# Patient Record
Sex: Female | Born: 1975
Health system: Southern US, Community
[De-identification: ages and names within clinical notes are randomized; demographics above are authoritative.]

## PROBLEM LIST (undated history)

## (undated) DIAGNOSIS — I6789 Other cerebrovascular disease: Secondary | ICD-10-CM

## (undated) DIAGNOSIS — Z8489 Family history of other specified conditions: Secondary | ICD-10-CM

## (undated) DIAGNOSIS — Z87442 Personal history of urinary calculi: Secondary | ICD-10-CM

## (undated) DIAGNOSIS — E785 Hyperlipidemia, unspecified: Secondary | ICD-10-CM

## (undated) DIAGNOSIS — G43909 Migraine, unspecified, not intractable, without status migrainosus: Secondary | ICD-10-CM

## (undated) DIAGNOSIS — F79 Unspecified intellectual disabilities: Secondary | ICD-10-CM

## (undated) DIAGNOSIS — R319 Hematuria, unspecified: Secondary | ICD-10-CM

## (undated) DIAGNOSIS — E7800 Pure hypercholesterolemia, unspecified: Secondary | ICD-10-CM

## (undated) DIAGNOSIS — R569 Unspecified convulsions: Secondary | ICD-10-CM

## (undated) DIAGNOSIS — L219 Seborrheic dermatitis, unspecified: Secondary | ICD-10-CM

## (undated) DIAGNOSIS — G40909 Epilepsy, unspecified, not intractable, without status epilepticus: Secondary | ICD-10-CM

## (undated) DIAGNOSIS — R4586 Emotional lability: Secondary | ICD-10-CM

## (undated) DIAGNOSIS — F39 Unspecified mood [affective] disorder: Secondary | ICD-10-CM

## (undated) DIAGNOSIS — R635 Abnormal weight gain: Secondary | ICD-10-CM

## (undated) HISTORY — DX: Hematuria, unspecified: R31.9

## (undated) HISTORY — DX: Other cerebrovascular disease: I67.89

## (undated) HISTORY — DX: Hyperlipidemia, unspecified: E78.5

## (undated) HISTORY — DX: Pure hypercholesterolemia, unspecified: E78.00

## (undated) HISTORY — DX: Abnormal weight gain: R63.5

## (undated) HISTORY — DX: Emotional lability: R45.86

## (undated) HISTORY — DX: Epilepsy, unspecified, not intractable, without status epilepticus: G40.909

## (undated) HISTORY — DX: Migraine, unspecified, not intractable, without status migrainosus: G43.909

## (undated) HISTORY — DX: Unspecified convulsions: R56.9

## (undated) HISTORY — DX: Unspecified mood (affective) disorder: F39

## (undated) HISTORY — DX: Seborrheic dermatitis, unspecified: L21.9

## (undated) HISTORY — PX: NO PAST SURGERIES: SHX2092

## (undated) HISTORY — DX: Unspecified intellectual disabilities: F79

---

## 2009-05-16 DIAGNOSIS — E785 Hyperlipidemia, unspecified: Secondary | ICD-10-CM

## 2009-05-16 DIAGNOSIS — G40909 Epilepsy, unspecified, not intractable, without status epilepticus: Secondary | ICD-10-CM

## 2009-05-16 HISTORY — DX: Epilepsy, unspecified, not intractable, without status epilepticus: G40.909

## 2009-05-16 HISTORY — DX: Hyperlipidemia, unspecified: E78.5

## 2009-12-12 ENCOUNTER — Ambulatory Visit: Payer: Self-pay | Admitting: Neurology

## 2011-10-17 ENCOUNTER — Other Ambulatory Visit: Payer: Self-pay | Admitting: Family Medicine

## 2012-09-08 DIAGNOSIS — R635 Abnormal weight gain: Secondary | ICD-10-CM | POA: Diagnosis not present

## 2012-09-08 DIAGNOSIS — F79 Unspecified intellectual disabilities: Secondary | ICD-10-CM | POA: Diagnosis not present

## 2012-09-08 DIAGNOSIS — G40909 Epilepsy, unspecified, not intractable, without status epilepticus: Secondary | ICD-10-CM | POA: Diagnosis not present

## 2012-09-08 DIAGNOSIS — E785 Hyperlipidemia, unspecified: Secondary | ICD-10-CM | POA: Diagnosis not present

## 2013-04-03 DIAGNOSIS — G40909 Epilepsy, unspecified, not intractable, without status epilepticus: Secondary | ICD-10-CM | POA: Diagnosis not present

## 2013-04-03 DIAGNOSIS — H6690 Otitis media, unspecified, unspecified ear: Secondary | ICD-10-CM | POA: Diagnosis not present

## 2013-04-03 DIAGNOSIS — F79 Unspecified intellectual disabilities: Secondary | ICD-10-CM | POA: Diagnosis not present

## 2013-04-03 DIAGNOSIS — H6120 Impacted cerumen, unspecified ear: Secondary | ICD-10-CM | POA: Diagnosis not present

## 2013-08-30 ENCOUNTER — Other Ambulatory Visit: Payer: Self-pay

## 2013-09-10 DIAGNOSIS — H6120 Impacted cerumen, unspecified ear: Secondary | ICD-10-CM | POA: Diagnosis not present

## 2013-09-10 DIAGNOSIS — F79 Unspecified intellectual disabilities: Secondary | ICD-10-CM | POA: Diagnosis not present

## 2013-09-10 DIAGNOSIS — Z0000 Encounter for general adult medical examination without abnormal findings: Secondary | ICD-10-CM | POA: Diagnosis not present

## 2013-09-10 DIAGNOSIS — Z01419 Encounter for gynecological examination (general) (routine) without abnormal findings: Secondary | ICD-10-CM | POA: Diagnosis not present

## 2013-09-10 DIAGNOSIS — E785 Hyperlipidemia, unspecified: Secondary | ICD-10-CM | POA: Diagnosis not present

## 2013-09-10 DIAGNOSIS — G40909 Epilepsy, unspecified, not intractable, without status epilepticus: Secondary | ICD-10-CM | POA: Diagnosis not present

## 2014-04-25 DIAGNOSIS — R569 Unspecified convulsions: Secondary | ICD-10-CM | POA: Diagnosis not present

## 2014-07-12 DIAGNOSIS — J069 Acute upper respiratory infection, unspecified: Secondary | ICD-10-CM | POA: Diagnosis not present

## 2014-07-12 DIAGNOSIS — G40909 Epilepsy, unspecified, not intractable, without status epilepticus: Secondary | ICD-10-CM | POA: Diagnosis not present

## 2015-01-17 DIAGNOSIS — Z1389 Encounter for screening for other disorder: Secondary | ICD-10-CM | POA: Diagnosis not present

## 2015-01-17 DIAGNOSIS — Z0000 Encounter for general adult medical examination without abnormal findings: Secondary | ICD-10-CM | POA: Diagnosis not present

## 2015-01-20 DIAGNOSIS — Z0000 Encounter for general adult medical examination without abnormal findings: Secondary | ICD-10-CM | POA: Diagnosis not present

## 2015-01-20 DIAGNOSIS — G40909 Epilepsy, unspecified, not intractable, without status epilepticus: Secondary | ICD-10-CM | POA: Diagnosis not present

## 2015-01-20 DIAGNOSIS — E785 Hyperlipidemia, unspecified: Secondary | ICD-10-CM | POA: Diagnosis not present

## 2015-01-20 DIAGNOSIS — F79 Unspecified intellectual disabilities: Secondary | ICD-10-CM | POA: Diagnosis not present

## 2015-01-20 LAB — LIPID PANEL
CHOLESTEROL: 246 mg/dL — AB (ref 0–200)
HDL: 68 mg/dL (ref 35–70)
LDL Cholesterol: 154 mg/dL
Triglycerides: 122 mg/dL (ref 40–160)

## 2015-01-20 LAB — BASIC METABOLIC PANEL
BUN: 9 mg/dL (ref 4–21)
Creatinine: 0.6 mg/dL (ref 0.5–1.1)
Glucose: 91 mg/dL
Potassium: 4.3 mmol/L (ref 3.4–5.3)
SODIUM: 140 mmol/L (ref 137–147)

## 2015-01-20 LAB — HEPATIC FUNCTION PANEL
ALT: 21 U/L (ref 7–35)
AST: 17 U/L (ref 13–35)
Alkaline Phosphatase: 81 U/L (ref 25–125)
Bilirubin, Total: 0.2 mg/dL

## 2015-01-20 LAB — CBC AND DIFFERENTIAL
HCT: 41 % (ref 36–46)
Hemoglobin: 13.9 g/dL (ref 12.0–16.0)
NEUTROS ABS: 54 /uL
PLATELETS: 278 10*3/uL (ref 150–399)
WBC: 5.8 10^3/mL

## 2015-01-20 LAB — TSH: TSH: 2.04 u[IU]/mL (ref 0.41–5.90)

## 2015-02-24 ENCOUNTER — Ambulatory Visit: Payer: Self-pay | Admitting: Family Medicine

## 2015-02-24 DIAGNOSIS — R635 Abnormal weight gain: Secondary | ICD-10-CM

## 2015-02-24 DIAGNOSIS — R4586 Emotional lability: Secondary | ICD-10-CM

## 2015-02-24 DIAGNOSIS — I6789 Other cerebrovascular disease: Secondary | ICD-10-CM

## 2015-02-24 DIAGNOSIS — G40909 Epilepsy, unspecified, not intractable, without status epilepticus: Secondary | ICD-10-CM | POA: Insufficient documentation

## 2015-02-24 DIAGNOSIS — F79 Unspecified intellectual disabilities: Secondary | ICD-10-CM

## 2015-02-24 HISTORY — DX: Epilepsy, unspecified, not intractable, without status epilepticus: G40.909

## 2015-02-24 HISTORY — DX: Emotional lability: R45.86

## 2015-02-24 HISTORY — DX: Unspecified intellectual disabilities: F79

## 2015-02-24 HISTORY — DX: Abnormal weight gain: R63.5

## 2015-04-15 ENCOUNTER — Other Ambulatory Visit: Payer: Self-pay | Admitting: Family Medicine

## 2015-04-17 ENCOUNTER — Other Ambulatory Visit: Payer: Self-pay | Admitting: Family Medicine

## 2015-04-17 DIAGNOSIS — G40001 Localization-related (focal) (partial) idiopathic epilepsy and epileptic syndromes with seizures of localized onset, not intractable, with status epilepticus: Secondary | ICD-10-CM

## 2015-05-25 DIAGNOSIS — R569 Unspecified convulsions: Secondary | ICD-10-CM | POA: Diagnosis not present

## 2015-06-21 DIAGNOSIS — R569 Unspecified convulsions: Secondary | ICD-10-CM | POA: Diagnosis not present

## 2015-06-21 DIAGNOSIS — G40309 Generalized idiopathic epilepsy and epileptic syndromes, not intractable, without status epilepticus: Secondary | ICD-10-CM | POA: Diagnosis not present

## 2015-08-06 ENCOUNTER — Other Ambulatory Visit: Payer: Self-pay | Admitting: Family Medicine

## 2015-08-11 ENCOUNTER — Other Ambulatory Visit: Payer: Self-pay | Admitting: Family Medicine

## 2015-08-15 NOTE — Telephone Encounter (Signed)
[facility]. RX was filled on 08/11/2015 for patient pick up.

## 2015-09-06 ENCOUNTER — Telehealth: Payer: Self-pay

## 2015-09-06 NOTE — Telephone Encounter (Signed)
[facility]ill [facility] Acetazolamide 250 mg tablet.

## 2015-09-07 ENCOUNTER — Other Ambulatory Visit: Payer: Self-pay | Admitting: Family Medicine

## 2015-09-08 NOTE — Telephone Encounter (Signed)
Dennis sent RX to pharmacy.  

## 2015-09-11 ENCOUNTER — Ambulatory Visit (INDEPENDENT_AMBULATORY_CARE_PROVIDER_SITE_OTHER): Payer: Medicare Other | Admitting: Family Medicine

## 2015-09-11 ENCOUNTER — Encounter: Payer: Self-pay | Admitting: Family Medicine

## 2015-09-11 VITALS — BP 100/68 | HR 95 | Resp 16 | Ht 64.0 in | Wt 168.0 lb

## 2015-09-11 DIAGNOSIS — E785 Hyperlipidemia, unspecified: Secondary | ICD-10-CM | POA: Diagnosis not present

## 2015-09-11 DIAGNOSIS — G40309 Generalized idiopathic epilepsy and epileptic syndromes, not intractable, without status epilepticus: Secondary | ICD-10-CM

## 2015-09-11 DIAGNOSIS — R569 Unspecified convulsions: Secondary | ICD-10-CM

## 2015-09-11 DIAGNOSIS — F39 Unspecified mood [affective] disorder: Secondary | ICD-10-CM | POA: Insufficient documentation

## 2015-09-11 DIAGNOSIS — G43909 Migraine, unspecified, not intractable, without status migrainosus: Secondary | ICD-10-CM | POA: Insufficient documentation

## 2015-09-11 HISTORY — DX: Unspecified convulsions: R56.9

## 2015-09-11 HISTORY — DX: Unspecified mood (affective) disorder: F39

## 2015-09-11 HISTORY — DX: Migraine, unspecified, not intractable, without status migrainosus: G43.909

## 2015-09-11 NOTE — Progress Notes (Signed)
Patient ID: Wendy Green, female   DOB: 1975/11/26, 39 y.o.   MRN: [id number]        Patient: Wendy Green Female    DOB: March 18, 1976   39 y.o.   MRN: [id number] Visit Date: 09/11/2015  Today's Provider: Vernie Murders, PA   Chief Complaint  Patient presents with  . Seizures  . Anxiety   Subjective:    Seizures  This is a chronic (Patient is followed-by Dr. Melrose Nakayama last seen on 07/03/15 next f/u 09/14/15.) problem. The problem has been gradually improving (Patient's mother reports that pt has not had witnessed seizures for about 6 months, per mother. ).  Anxiety Presents for follow-up visit. Primary symptoms comment: angry. Symptoms occur occasionally. Exacerbated by: problems at school [employer], suspended for 6 months)     Patient Active Problem List   Diagnosis Date Noted  . Headache, migraine 09/11/2015  . Episodic mood disorder ([place]) 09/11/2015  . Seizure ([place]) 09/11/2015  . Intellectual disability 02/24/2015  . Changeable mood ([place]) 02/24/2015  . Cerebral seizure ([place]) 02/24/2015  . Abnormal weight gain 02/24/2015  . Epilepsy ([place]) 05/16/2009  . HLD (hyperlipidemia) 05/16/2009   Past Surgical History  Procedure Laterality Date  . No past surgeries     Family History  Problem Relation Age of Onset  . Heart attack Father   . Stroke Maternal Grandmother   . Pneumonia Maternal Grandfather   . Cancer Paternal Grandfather     Allergies  Allergen Reactions  . Aspirin    Previous Medications   ACETAZOLAMIDE (DIAMOX) 250 MG TABLET    TAKE 1 TABLET BY MOUTH 3 TIMES A DAY   CETIRIZINE-PSEUDOEPHEDRINE (ZYRTEC-D) 5-120 MG TABLET    Take 1 tablet by mouth 2 (two) times daily.   FOLIC ACID (FOLVITE) 1 MG TABLET    TAKE 1 TABLET BY MOUTH ONCE DAILY   MULTIPLE VITAMIN PO    1 tablet daily.   OMEGA-3 FATTY ACIDS PO    1 tablet 2 (two) times daily.   OXCARBAZEPINE (TRILEPTAL) 150 MG TABLET    Take 1 tablet by mouth 2 (two) times daily.   PRIMIDONE (MYSOLINE) 250 MG  TABLET    TAKE 1 TABLET BY MOUTH THREE TIMES DAILY AND 1/2 TABLET IN THE EVENING   PSYLLIUM 48.57 % POWD    METAMUCIL, 48.57% (Oral Powder)  As Directed for 0 days  Quantity: 0.00;  Refills: 0   Ordered :31-Aug-2010  Celene Kras, MA, Anastasiya ;  Started 15-Aug-2009 Active   RED YEAST RICE 600 MG CAPS    Take 1 capsule by mouth daily.    Review of Systems  HENT: Negative.   Eyes: Negative.   Respiratory: Negative.   Cardiovascular: Negative.   Gastrointestinal: Negative.   Genitourinary: Negative.   Neurological: Positive for seizures.    Social History  Substance Use Topics  . Smoking status: Never Smoker   . Smokeless tobacco: Never Used  . Alcohol Use: No   Objective:   BP 100/68 mmHg  Pulse 95  Resp 16  Ht 5\' 4"  (1.626 m)  Wt 168 lb (76.204 kg)  BMI 28.82 kg/m2  SpO2 97%  LMP  (LMP Unknown)  Physical Exam  Constitutional: She appears well-developed and well-nourished.  HENT:  Head: Normocephalic.  Right Ear: External ear normal.  Left Ear: External ear normal.  Nose: Nose normal.  Mouth/Throat: Oropharynx is clear and moist.  Eyes: Conjunctivae and EOM are normal.  Neck: Normal range of motion. Neck supple.  Cardiovascular: Normal  rate, regular rhythm and normal heart sounds.   Pulmonary/Chest: Effort normal and breath sounds normal.  Abdominal: Soft.  Musculoskeletal: Normal range of motion.  Neurological: She has normal reflexes.  Difficulty cooperating with exam.  Psychiatric:  Flat affect with mental retardation and irritability.      Assessment & Plan:     1. Nonintractable generalized idiopathic epilepsy without status epilepticus ([place]) Stable with no significant seizure activity in 6 months. Neurologist (Dr. Melrose Nakayama) adjusting medications to control absence seizures. Will recheck labs and continue present follow up plan with Dr. Melrose Nakayama on 09-14-15. - CBC with Differential/Platelet - Phenobarbital level  2. HLD (hyperlipidemia) Still trying to  control fats in diet and using Krill Oil one to two times a day. Recheck labs and follow up pending reports. - COMPLETE METABOLIC PANEL WITH GFR - Lipid panel - TSH  3. Episodic mood disorder (HCC) Some irritability and anxiety around any strangers/unfamiliar people. Lives with mother full time. Considering an adult daycare occasionally. Mood swings associated with mental retardation.       Vernie Murders, PA  [facility]

## 2015-09-12 ENCOUNTER — Telehealth: Payer: Self-pay | Admitting: Family Medicine

## 2015-09-12 NOTE — Telephone Encounter (Signed)
Mom called in this moring asking if the [facility] [place].  She did not see it on the summary.  She said you had talked about ordering it for her.  Her call backis [phone] Thanks, Con Memos

## 2015-09-12 NOTE — Telephone Encounter (Signed)
LMTCB

## 2015-09-12 NOTE — Telephone Encounter (Signed)
Please review

## 2015-09-12 NOTE — Telephone Encounter (Signed)
Flonase is available over the counter now and does not require a prescription. Would give it a try this way first. If it works well, will try to get it by prescription generically.

## 2015-09-13 NOTE — Telephone Encounter (Signed)
Patient's mother Remo Lipps advised as directed below.

## 2015-09-14 DIAGNOSIS — G40309 Generalized idiopathic epilepsy and epileptic syndromes, not intractable, without status epilepticus: Secondary | ICD-10-CM | POA: Diagnosis not present

## 2015-09-15 ENCOUNTER — Telehealth: Payer: Self-pay | Admitting: Family Medicine

## 2015-09-15 NOTE — Telephone Encounter (Signed)
Pt's mom came to the office to request when we get the lab results back she would like them faxed to Dr. Melrose Nakayama (pt urologist). I advised that Simona Huh is out of the office until 09/26/15 and when the nurse calls with the labs results she might want to mention this again. Thanks TNP

## 2015-09-15 NOTE — Telephone Encounter (Signed)
Fyi....  Thanks,   -Mickel Baas

## 2015-09-19 DIAGNOSIS — E785 Hyperlipidemia, unspecified: Secondary | ICD-10-CM | POA: Diagnosis not present

## 2015-09-19 DIAGNOSIS — G40309 Generalized idiopathic epilepsy and epileptic syndromes, not intractable, without status epilepticus: Secondary | ICD-10-CM | POA: Diagnosis not present

## 2015-09-20 LAB — COMPREHENSIVE METABOLIC PANEL
ALBUMIN: 4.1 g/dL (ref 3.5–5.5)
ALK PHOS: 82 IU/L (ref 39–117)
ALT: 20 IU/L (ref 0–32)
AST: 16 IU/L (ref 0–40)
Albumin/Globulin Ratio: 1.9 (ref 1.1–2.5)
BUN / CREAT RATIO: 24 — AB (ref 8–20)
BUN: 12 mg/dL (ref 6–20)
Bilirubin Total: <0.2 mg/dL (ref 0.0–1.2)
CO2: 19 mmol/L (ref 18–29)
CREATININE: 0.49 mg/dL — AB (ref 0.57–1.00)
Calcium: 8.8 mg/dL (ref 8.7–10.2)
Chloride: 104 mmol/L (ref 96–106)
GFR calc Af Amer: 142 mL/min/{1.73_m2} (ref >59)
GFR, EST NON AFRICAN AMERICAN: 123 mL/min/{1.73_m2} (ref >59)
GLOBULIN, TOTAL: 2.2 g/dL (ref 1.5–4.5)
Glucose: 85 mg/dL (ref 65–99)
Potassium: 4.2 mmol/L (ref 3.5–5.2)
SODIUM: 140 mmol/L (ref 134–144)
Total Protein: 6.3 g/dL (ref 6.0–8.5)

## 2015-09-20 LAB — PHENOBARBITAL LEVEL: Phenobarbital, Serum: 23 ug/mL (ref 15–40)

## 2015-09-20 LAB — LIPID PANEL
CHOL/HDL RATIO: 3.3 ratio (ref 0.0–4.4)
Cholesterol, Total: 217 mg/dL — ABNORMAL HIGH (ref 100–199)
HDL: 65 mg/dL (ref >39)
LDL Calculated: 117 mg/dL — ABNORMAL HIGH (ref 0–99)
TRIGLYCERIDES: 173 mg/dL — AB (ref 0–149)
VLDL CHOLESTEROL CAL: 35 mg/dL (ref 5–40)

## 2015-09-20 LAB — CBC WITH DIFFERENTIAL/PLATELET
Basophils Absolute: 0 10*3/uL (ref 0.0–0.2)
Basos: 0 %
EOS (ABSOLUTE): 0.4 10*3/uL (ref 0.0–0.4)
EOS: 5 %
HEMATOCRIT: 40.2 % (ref 34.0–46.6)
HEMOGLOBIN: 13.5 g/dL (ref 11.1–15.9)
IMMATURE GRANS (ABS): 0 10*3/uL (ref 0.0–0.1)
IMMATURE GRANULOCYTES: 0 %
LYMPHS: 26 %
Lymphocytes Absolute: 1.9 10*3/uL (ref 0.7–3.1)
MCH: 32.1 pg (ref 26.6–33.0)
MCHC: 33.6 g/dL (ref 31.5–35.7)
MCV: 96 fL (ref 79–97)
MONOCYTES: 9 %
MONOS ABS: 0.6 10*3/uL (ref 0.1–0.9)
NEUTROS PCT: 60 %
Neutrophils Absolute: 4.3 10*3/uL (ref 1.4–7.0)
Platelets: 273 10*3/uL (ref 150–379)
RBC: 4.2 x10E6/uL (ref 3.77–5.28)
RDW: 13.6 % (ref 12.3–15.4)
WBC: 7.1 10*3/uL (ref 3.4–10.8)

## 2015-09-20 LAB — TSH: TSH: 2.87 u[IU]/mL (ref 0.450–4.500)

## 2015-09-21 NOTE — Telephone Encounter (Signed)
-----   Message from Margo Common, [place] sent at 09/21/2015  9:58 AM EST ----- Primidone in the therapeutic range (phenobarbital). Normal liver and kidney function. No anemia. Cholesterol still elevated but LDL better than last check and very good HDL. Triglycerides still elevated. Lower fats in diet and be sure a copy of this report is sent to her neurologist.

## 2015-09-21 NOTE — Telephone Encounter (Signed)
Patient and patient's mother Remo Lipps advised as directed below. Both verbalized understanding. Copy of lab results faxed to Dr. Melrose Nakayama at [phone].

## 2015-11-26 ENCOUNTER — Other Ambulatory Visit: Payer: Self-pay | Admitting: Family Medicine

## 2015-12-18 DIAGNOSIS — R569 Unspecified convulsions: Secondary | ICD-10-CM | POA: Diagnosis not present

## 2015-12-18 DIAGNOSIS — G40309 Generalized idiopathic epilepsy and epileptic syndromes, not intractable, without status epilepticus: Secondary | ICD-10-CM | POA: Diagnosis not present

## 2016-03-21 ENCOUNTER — Other Ambulatory Visit: Payer: Self-pay | Admitting: Family Medicine

## 2016-03-27 ENCOUNTER — Telehealth: Payer: Self-pay | Admitting: Family Medicine

## 2016-03-27 DIAGNOSIS — G40909 Epilepsy, unspecified, not intractable, without status epilepticus: Secondary | ICD-10-CM

## 2016-03-27 NOTE — Telephone Encounter (Signed)
Please review. Thanks!  

## 2016-03-27 NOTE — Telephone Encounter (Signed)
Pt mom Wendy Green is requesting a lab slip to have drug levels rechecked.  [id number]

## 2016-03-28 NOTE — Telephone Encounter (Signed)
Will put future order into chart for phenobarbital blood level. Be sure copy of the report is given to her neurologist.

## 2016-04-08 ENCOUNTER — Other Ambulatory Visit: Payer: Self-pay

## 2016-04-08 DIAGNOSIS — R569 Unspecified convulsions: Secondary | ICD-10-CM

## 2016-04-08 DIAGNOSIS — G40909 Epilepsy, unspecified, not intractable, without status epilepticus: Secondary | ICD-10-CM | POA: Diagnosis not present

## 2016-04-08 NOTE — Telephone Encounter (Signed)
Done

## 2016-04-11 LAB — PHENOBARBITAL LEVEL: PHENOBARBITAL, SERUM: 24 ug/mL (ref 15–40)

## 2016-04-11 LAB — 10-HYDROXYCARBAZEPINE: OXCARBAZEPINE SERPL-MCNC: 4 ug/mL — AB (ref 10–35)

## 2016-05-09 ENCOUNTER — Ambulatory Visit (INDEPENDENT_AMBULATORY_CARE_PROVIDER_SITE_OTHER): Payer: Medicare Other | Admitting: Family Medicine

## 2016-05-09 ENCOUNTER — Encounter: Payer: Self-pay | Admitting: Family Medicine

## 2016-05-09 VITALS — BP 100/62 | HR 85 | Temp 97.5°F | Resp 16 | Wt 169.8 lb

## 2016-05-09 DIAGNOSIS — H6121 Impacted cerumen, right ear: Secondary | ICD-10-CM | POA: Diagnosis not present

## 2016-05-09 NOTE — Progress Notes (Signed)
Subjective:     Patient ID: Wendy Green, female   DOB: 1976-03-24, 40 y.o.   MRN: [id number]  HPI  Chief Complaint  Patient presents with  . Ear Pain    Patient comes in office today accompanied by her mother with concerns of right ear pain for the past 2-3 days.   No regular use of Q-tips but has had wax removal in the past. Reports swimming at camp last month.   Review of Systems     Objective:   Physical Exam  Constitutional: She appears well-developed and well-nourished. No distress.  HENT:  Left Ear: External ear normal.  Right ear occluded by cerumen. After irrigation per Kat: ear canal is patent, TM intact, and patient reports improvement in her sx.     Assessment:    1. Cerumen impaction, right - EAR CERUMEN REMOVAL    Plan:    Call if continued discomfort.

## 2016-05-09 NOTE — Patient Instructions (Addendum)
Let us know if ear continues to hurt.

## 2016-06-01 ENCOUNTER — Encounter: Payer: Self-pay | Admitting: Family Medicine

## 2016-06-01 ENCOUNTER — Ambulatory Visit (INDEPENDENT_AMBULATORY_CARE_PROVIDER_SITE_OTHER): Payer: Medicare Other | Admitting: Family Medicine

## 2016-06-01 VITALS — BP 100/62 | HR 89 | Temp 98.6°F | Resp 16 | Wt 168.2 lb

## 2016-06-01 DIAGNOSIS — N390 Urinary tract infection, site not specified: Secondary | ICD-10-CM | POA: Diagnosis not present

## 2016-06-01 DIAGNOSIS — M545 Low back pain: Secondary | ICD-10-CM

## 2016-06-01 DIAGNOSIS — R319 Hematuria, unspecified: Secondary | ICD-10-CM | POA: Diagnosis not present

## 2016-06-01 LAB — POCT URINALYSIS DIPSTICK
BILIRUBIN UA: NEGATIVE
Glucose, UA: NEGATIVE
Ketones, UA: NEGATIVE
NITRITE UA: NEGATIVE
PH UA: 7.5
Protein, UA: 30
SPEC GRAV UA: 1.01
Urobilinogen, UA: 0.2

## 2016-06-01 MED ORDER — CIPROFLOXACIN HCL 500 MG PO TABS: 500.0000 mg | ORAL_TABLET | Freq: Two times a day (BID) | ORAL | 0 refills | Status: AC

## 2016-06-01 MED ORDER — INDOMETHACIN 50 MG PO CAPS: ORAL_CAPSULE | ORAL | 0 refills | Status: DC

## 2016-06-01 NOTE — Progress Notes (Signed)
Patient: Wendy Green Female    DOB: July 08, 1976   40 y.o.   MRN: [id number] Visit Date: 06/01/2016  Today's Provider: Lelon Huh, MD   Chief Complaint  Patient presents with  . Back Pain   Subjective:    HPI Back Pain: Patient presents for presents evaluation of low back problems.  Symptoms have been present for a few weeks and include pain in lower right side of her back (dull and sharp in character; unable to say/10 in severity).Symptoms are worst: nighttime. Alleviating factors identifiable by patient are none. Exacerbating factors identifiable by patient are sitting. Treatments so far initiated by patient: none. She is having Hematuria. LMP was 05/16/16 and was normal.     Allergies  Allergen Reactions  . Aspirin      Current Outpatient Prescriptions:  .  acetaZOLAMIDE (DIAMOX) 250 MG tablet, TAKE 1 TABLET BY MOUTH 3 TIMES A DAY, Disp: 90 tablet, Rfl: 3 .  folic acid (FOLVITE) 1 MG tablet, TAKE 1 TABLET BY MOUTH ONCE DAILY, Disp: , Rfl:  .  KRILL OIL PO, Take by mouth., Disp: , Rfl:  .  MULTIPLE VITAMIN PO, 1 tablet daily., Disp: , Rfl:  .  OXcarbazepine (TRILEPTAL) 150 MG tablet, Take 1 tablet by mouth 2 (two) times daily., Disp: , Rfl:  .  PREVIDENT 5000 SENSITIVE 1.1-5 % PSTE, See admin instructions., Disp: , Rfl: 1 .  primidone (MYSOLINE) 250 MG tablet, TAKE 1 TABLET BY MOUTH THREE TIMES DAILY AND 1/2 TABLET IN THE EVENING, Disp: 100 tablet, Rfl: 3 .  Psyllium 48.57 % POWD, METAMUCIL, 48.57% (Oral Powder)  As Directed for 0 days  Quantity: 0.00;  Refills: 0   Ordered :31-Aug-2010  Celene Kras, MA, Anastasiya ;  Started 15-Aug-2009 Active, Disp: , Rfl:  .  Red Yeast Rice 600 MG CAPS, Take 1 capsule by mouth daily., Disp: , Rfl:   Review of Systems  Cardiovascular: Negative for chest pain, palpitations and leg swelling.  Genitourinary: Positive for hematuria. Negative for dysuria and frequency.  Musculoskeletal: Positive for back pain.    Social History    Substance Use Topics  . Smoking status: Never Smoker  . Smokeless tobacco: Never Used  . Alcohol use No   Objective:   BP 100/62 (BP Location: Left Arm, Patient Position: Sitting, Cuff Size: Normal)   Pulse 89   Temp 98.6 F (37 C) (Oral)   Resp 16   Wt 168 lb 3.2 oz (76.3 kg)   LMP 05/16/2016   BMI 28.87 kg/m   Physical Exam  General Appearance:    Alert, cooperative, no distress  Eyes:    PERRL, conjunctiva/corneas clear, EOM's intact       Lungs:     Clear to auscultation bilaterally, respirations unlabored  Heart:    Regular rate and rhythm  Abdomen:   bowel sounds present and normal in all 4 quadrants, soft, round, nontender or nondistended. No CVA tenderness. No spine or para-spinous tenderness.    Results for orders placed or performed in visit on 06/01/16  POCT urinalysis dipstick  Result Value Ref Range   Color, UA Dark Yellow    Clarity, UA Cloudy    Glucose, UA Negative    Bilirubin, UA Negative    Ketones, UA Negative    Spec Grav, UA 1.010    Blood, UA Large    pH, UA 7.5    Protein, UA 30    Urobilinogen, UA 0.2    Nitrite, UA  Negative    Leukocytes, UA small (1+) (A) Negative        Assessment & Plan:     1. Right low back pain, with sciatica presence unspecified Now resolved. Suspected she passed small stone.  Sent home with strainer and may take indomethacin if pain returns.  - POCT urinalysis dipstick - indomethacin (INDOCIN) 50 MG capsule; Take one tablet every eight hours as needed for pain  Dispense: 10 capsule; Refill: 0  2. Hematuria Culture urine and cover with Cipro while awaiting culture.  - ciprofloxacin (CIPRO) 500 MG tablet; Take 1 tablet (500 mg total) by mouth 2 (two) times daily.  Dispense: 14 tablet; Refill: 0  3. Urinary tract infection with hematuria, site unspecified  - Urine culture       Lelon Huh, MD  [facility]

## 2016-06-03 ENCOUNTER — Ambulatory Visit
Admission: RE | Admit: 2016-06-03 | Discharge: 2016-06-03 | Disposition: A | Discharge status: Home / Self Care | Payer: Medicare Other | Source: Ambulatory Visit | Attending: Family Medicine | Admitting: Family Medicine

## 2016-06-03 ENCOUNTER — Telehealth: Payer: Self-pay | Admitting: *Deleted

## 2016-06-03 DIAGNOSIS — R319 Hematuria, unspecified: Secondary | ICD-10-CM

## 2016-06-03 LAB — URINE CULTURE

## 2016-06-03 NOTE — Telephone Encounter (Signed)
Patient's mother Remo Lipps was notified. Remo Lipps stated that pt is not have any more pain but she is still having blood. They want to go ahead with Xray. Also wanted to know if she should continue antibiotic?

## 2016-06-03 NOTE — Telephone Encounter (Signed)
Remo Lipps was notified. Xray ordered.

## 2016-06-03 NOTE — Telephone Encounter (Signed)
She should stay on antibiotic. She probably has a stone and these can increase risk for urinary tract infection. Please order KUB for hematuria. If this is negative we will need to refer her to urologist.

## 2016-06-03 NOTE — Telephone Encounter (Signed)
-----   Message from Birdie Sons, MD sent at 06/03/2016  7:33 AM EDT ----- Urine culture does not show infection. If she has had any more blood or any pain then she needs Xr of kidneys and bladder (KUB)

## 2016-06-04 ENCOUNTER — Telehealth: Payer: Self-pay

## 2016-06-04 DIAGNOSIS — R319 Hematuria, unspecified: Secondary | ICD-10-CM | POA: Insufficient documentation

## 2016-06-04 HISTORY — DX: Hematuria, unspecified: R31.9

## 2016-06-04 NOTE — Telephone Encounter (Signed)
Pt's mom Remo Lipps returned call and would like a call back. Thanks TNP

## 2016-06-04 NOTE — Telephone Encounter (Signed)
LMTCB 06/04/2016  Thanks,   -Mickel Baas

## 2016-06-04 NOTE — Telephone Encounter (Signed)
Remo Lipps advised. She says she would like Paulyne to be referred to [facility].  Thanks,   -Mickel Baas

## 2016-06-04 NOTE — Telephone Encounter (Signed)
-----   Message from Birdie Sons, MD sent at 06/04/2016  8:02 AM EDT ----- Wendy Green is normal. Need referral to urology for further evaluation of hematuria.

## 2016-06-06 ENCOUNTER — Ambulatory Visit (INDEPENDENT_AMBULATORY_CARE_PROVIDER_SITE_OTHER): Payer: Medicare Other

## 2016-06-06 DIAGNOSIS — Z23 Encounter for immunization: Secondary | ICD-10-CM

## 2016-06-19 DIAGNOSIS — G40309 Generalized idiopathic epilepsy and epileptic syndromes, not intractable, without status epilepticus: Secondary | ICD-10-CM | POA: Diagnosis not present

## 2016-06-20 ENCOUNTER — Encounter: Payer: Self-pay | Admitting: Urology

## 2016-06-20 ENCOUNTER — Ambulatory Visit (INDEPENDENT_AMBULATORY_CARE_PROVIDER_SITE_OTHER): Payer: Medicare Other | Admitting: Urology

## 2016-06-20 VITALS — BP 114/81 | HR 74 | Ht 64.0 in | Wt 167.0 lb

## 2016-06-20 DIAGNOSIS — R310 Gross hematuria: Secondary | ICD-10-CM | POA: Diagnosis not present

## 2016-06-20 DIAGNOSIS — R109 Unspecified abdominal pain: Secondary | ICD-10-CM

## 2016-06-20 LAB — MICROSCOPIC EXAMINATION

## 2016-06-20 LAB — URINALYSIS, COMPLETE
Bilirubin, UA: NEGATIVE
GLUCOSE, UA: NEGATIVE
Ketones, UA: NEGATIVE
NITRITE UA: NEGATIVE
PROTEIN UA: NEGATIVE
RBC, UA: NEGATIVE
Specific Gravity, UA: 1.01 (ref 1.005–1.030)
Urobilinogen, Ur: 0.2 mg/dL (ref 0.2–1.0)
pH, UA: 6 (ref 5.0–7.5)

## 2016-06-20 NOTE — Progress Notes (Signed)
06/20/2016 4:27 PM   Chipper Herb [birth year] [id number]  Referring provider: Margo Common, [facility] [place] [address] [place], [place] [address]  Chief Complaint  Patient presents with  . Hematuria    New Patient    HPI: Patient is a 40 -year-old Caucasian female who presents today with her mother as a referral from their PCP, Vernie Murders, PA, for gross hematuria.  Patient had an episode of gross hematuria a few weeks ago.  Patient doesn't have a prior history of hematuria.    She does not have a prior history of recurrent urinary tract infections, nephrolithiasis, trauma to the genitourinary tract or malignancies of the genitourinary tract.   She does not have a family medical history of nephrolithiasis, malignancies of the genitourinary tract or hematuria.   Today, she is having symptoms of frequent urination and urgency, but she denies dysuria, nocturia, incontinence, hesitancy, intermittency, straining to urinate or a weak urinary stream.  Her UA today was unremarkable.  She is experiencing right flank pain.  She denies any recent fevers, chills, nausea or vomiting.   She did have a KUB on 06/03/2016 which was negative.  I have independently reviewed the films.    She is not a smoker.  She is not exposed to secondhand smoke.  She has not worked with [employer].    PMH: Past Medical History:  Diagnosis Date  . Abnormal weight gain 02/24/2015  . Cerebral seizure ([place]) 02/24/2015   dx at age 45.   . Changeable mood ([place]) 02/24/2015  . Epilepsy ([place]) 05/16/2009  . Episodic mood disorder ([place]) 09/11/2015  . Headache, migraine 09/11/2015  . Hematuria 06/04/2016  . HLD (hyperlipidemia) 05/16/2009  . Intellectual disability 02/24/2015  . Seizure ([place]) 09/11/2015    Surgical History: Past Surgical History:  Procedure Laterality Date  . NO PAST SURGERIES      Home Medications:    Medication List       Accurate as of 06/20/16  4:27 PM. Always use your most  recent med list.          acetaZOLAMIDE 250 MG tablet Commonly known as:  DIAMOX TAKE 1 TABLET BY MOUTH 3 TIMES A DAY   fluticasone 50 MCG/ACT nasal spray Commonly known as:  FLONASE Place into both nostrils daily.   folic acid 1 MG tablet Commonly known as:  FOLVITE TAKE 1 TABLET BY MOUTH ONCE DAILY   indomethacin 50 MG capsule Commonly known as:  INDOCIN Take one tablet every eight hours as needed for pain   KRILL OIL PO Take by mouth.   MULTIPLE VITAMIN PO 1 tablet daily.   OXcarbazepine 150 MG tablet Commonly known as:  TRILEPTAL Take 1 tablet by mouth 2 (two) times daily.   primidone 250 MG tablet Commonly known as:  MYSOLINE TAKE 1 TABLET BY MOUTH THREE TIMES DAILY AND 1/2 TABLET IN THE EVENING   Psyllium 48.57 % Powd METAMUCIL, 48.57% (Oral Powder)  As Directed for 0 days  Quantity: 0.00;  Refills: 0   Ordered :31-Aug-2010  Celene Kras, MA, Anastasiya ;  Started 15-Aug-2009 Active   Red Yeast Rice 600 MG Caps Take 1 capsule by mouth daily.       Allergies:  Allergies  Allergen Reactions  . Aspirin     Family History: Family History  Problem Relation Age of Onset  . Heart attack Father   . Stroke Maternal Grandmother   . Pneumonia Maternal Grandfather   . Cancer Paternal Grandfather   .  Prostate cancer Neg Hx   . Kidney cancer Neg Hx     Social History:  reports that she has never smoked. She has never used smokeless tobacco. She reports that she does not drink alcohol or use drugs.  ROS: UROLOGY Frequent Urination?: Yes Hard to postpone urination?: Yes Burning/pain with urination?: No Get up at night to urinate?: No Leakage of urine?: No Urine stream starts and stops?: No Trouble starting stream?: No Do you have to strain to urinate?: No Blood in urine?: Yes Urinary tract infection?: No Sexually transmitted disease?: No Injury to kidneys or bladder?: No Painful intercourse?: No Weak stream?: No Currently pregnant?: No Vaginal  bleeding?: No Last menstrual period?: 05/18/16  Gastrointestinal Nausea?: No Vomiting?: No Indigestion/heartburn?: No Diarrhea?: No Constipation?: No  Constitutional Fever: No Night sweats?: No Weight loss?: No Fatigue?: No  Skin Skin rash/lesions?: No Itching?: No  Eyes Blurred vision?: No Double vision?: No  Ears/Nose/Throat Sore throat?: No Sinus problems?: No  Hematologic/Lymphatic Swollen glands?: No Easy bruising?: Yes  Cardiovascular Leg swelling?: No Chest pain?: No  Respiratory Cough?: No Shortness of breath?: No  Endocrine Excessive thirst?: No  Musculoskeletal Back pain?: Yes Joint pain?: No  Neurological Headaches?: No Dizziness?: No  Psychologic Depression?: No Anxiety?: No  Physical Exam: BP 114/81   Pulse 74   Ht 5\' 4"  (1.626 m)   Wt 167 lb (75.8 kg)   LMP 05/16/2016   BMI 28.67 kg/m   Constitutional: Well nourished. Alert and oriented, No acute distress. HEENT: [place] AT, moist mucus membranes. Trachea midline, no masses. Cardiovascular: No clubbing, cyanosis, or edema. Respiratory: Normal respiratory effort, no increased work of breathing. GI: Abdomen is soft, non tender, non distended, no abdominal masses. Liver and spleen not palpable.  No hernias appreciated.  Stool sample for occult testing is not indicated.   GU: No CVA tenderness.  No bladder fullness or masses.   Skin: No rashes, bruises or suspicious lesions. Lymph: No cervical or inguinal adenopathy. Neurologic: Grossly intact, no focal deficits, moving all 4 extremities. Psychiatric: Normal mood and affect.  Laboratory Data: Lab Results  Component Value Date   WBC 7.1 09/19/2015   HGB 13.9 01/20/2015   HCT 40.2 09/19/2015   MCV 96 09/19/2015   PLT 273 09/19/2015    Lab Results  Component Value Date   CREATININE 0.49 (L) 09/19/2015    Lab Results  Component Value Date   TSH 2.870 09/19/2015       Component Value Date/Time   CHOL 217 (H) 09/19/2015  1037   HDL 65 09/19/2015 1037   CHOLHDL 3.3 09/19/2015 1037   LDLCALC 117 (H) 09/19/2015 1037    Lab Results  Component Value Date   AST 16 09/19/2015   Lab Results  Component Value Date   ALT 20 09/19/2015     Urinalysis Unremarkable. See EPIC.   Pertinent Imaging: CLINICAL DATA:  Right-sided kidney stones with hematuria up.  EXAM: ABDOMEN - 1 VIEW  COMPARISON:  None.  FINDINGS: Bowel gas pattern is within normal limits. No free air. No abnormal calcifications overlie either kidney or the course of either ureter. No abnormal calcifications in the pelvis. Bony structures appear normal.  IMPRESSION: Negative radiographs.  No renal stone disease evident.   Electronically Signed   By: Nelson Chimes M.D.   On: 06/03/2016 16:06  Assessment & Plan:    1. Gross hematuria  -  I explained to the patient and her mother that there are a number of causes  that can be associated with blood in the urine, such as stones,  UTI's, damage to the urinary tract and/or cancer.  - At this time, I felt that the patient warranted further urologic evaluation.   The AUA guidelines state that a CT urogram is the preferred imaging study to evaluate hematuria.  - I explained to the patient and her mother that a contrast material will be injected into a vein and that in rare instances, an allergic reaction can result and may even life threatening   The patient denies any allergies to contrast, iodine and/or seafood and is not taking metformin.  - Her reproductive status is unknown at this time. We will obtain a serum pregnancy test today.   - The patient had the opportunity to ask questions which were answered. Based upon this discussion, the patient and mother are willing to proceed. Therefore, I've ordered: a CT Urogram.    - She will return following all of the above for discussion of the results.     - Urinalysis, Complete  - CULTURE, URINE COMPREHENSIVE  - BUN+Creat  2. Right flank  pain  - Patient will be undergoing CT urogram in the future to evaluate for stones or other possible etiologies for her right-sided flank pain   Return for CT Urogram report.  These notes generated with voice recognition software. I apologize for typographical errors.  Zara Council, [facility] [address] [place],  [address] [phone]

## 2016-06-21 LAB — BUN+CREAT
BUN / CREAT RATIO: 14 (ref 9–23)
BUN: 8 mg/dL (ref 6–24)
CREATININE: 0.58 mg/dL (ref 0.57–1.00)
GFR calc non Af Amer: 116 mL/min/{1.73_m2} (ref >59)
GFR, EST AFRICAN AMERICAN: 133 mL/min/{1.73_m2} (ref >59)

## 2016-06-21 LAB — HCG, SERUM, QUALITATIVE: hCG,Beta Subunit,Qual,Serum: NEGATIVE m[IU]/mL (ref <6)

## 2016-06-24 LAB — CULTURE, URINE COMPREHENSIVE

## 2016-07-01 ENCOUNTER — Ambulatory Visit
Admission: RE | Admit: 2016-07-01 | Discharge: 2016-07-01 | Disposition: A | Discharge status: Home / Self Care | Payer: Medicare Other | Source: Ambulatory Visit | Attending: Urology | Admitting: Urology

## 2016-07-01 DIAGNOSIS — D252 Subserosal leiomyoma of uterus: Secondary | ICD-10-CM | POA: Diagnosis not present

## 2016-07-01 DIAGNOSIS — R310 Gross hematuria: Secondary | ICD-10-CM | POA: Insufficient documentation

## 2016-07-01 MED ORDER — IOPAMIDOL (ISOVUE-300) INJECTION 61%
150.0000 mL | Freq: Once | INTRAVENOUS | Status: AC | PRN
  Administered 2016-07-01: 150 mL via INTRAVENOUS

## 2016-07-04 ENCOUNTER — Ambulatory Visit (INDEPENDENT_AMBULATORY_CARE_PROVIDER_SITE_OTHER): Payer: Medicare Other | Admitting: Urology

## 2016-07-04 ENCOUNTER — Encounter: Payer: Self-pay | Admitting: Urology

## 2016-07-04 VITALS — BP 119/76 | HR 68 | Ht 65.0 in | Wt 165.5 lb

## 2016-07-04 DIAGNOSIS — R310 Gross hematuria: Secondary | ICD-10-CM

## 2016-07-04 DIAGNOSIS — Z01818 Encounter for other preprocedural examination: Secondary | ICD-10-CM | POA: Diagnosis not present

## 2016-07-04 DIAGNOSIS — R109 Unspecified abdominal pain: Secondary | ICD-10-CM

## 2016-07-04 LAB — URINALYSIS, COMPLETE
Bilirubin, UA: NEGATIVE
Glucose, UA: NEGATIVE
KETONES UA: NEGATIVE
Nitrite, UA: NEGATIVE
Protein, UA: NEGATIVE
SPEC GRAV UA: 1.015 (ref 1.005–1.030)
Urobilinogen, Ur: 0.2 mg/dL (ref 0.2–1.0)
pH, UA: 8 — ABNORMAL HIGH (ref 5.0–7.5)

## 2016-07-04 LAB — MICROSCOPIC EXAMINATION: RBC, UA: NONE SEEN /hpf (ref 0–?)

## 2016-07-04 NOTE — Progress Notes (Signed)
07/04/2016 9:10 PM   Wendy Green 1976-01-01 [id number]  Referring provider: Margo Common, [place], [place] [address]  Chief Complaint  Patient presents with  . Results    CT    HPI: Patient is a 40 year old Caucasian female with a intellectual disability who presents today with her mother to review the findings of her CT urogram performed for gross hematuria.  Background history Patient was a referral from their PCP, Vernie Murders, PA, for gross hematuria.  Patient had an episode of gross hematuria a few weeks ago.  Patient doesn't have a prior history of hematuria.   She does not have a prior history of recurrent urinary tract infections, nephrolithiasis, trauma to the genitourinary tract or malignancies of the genitourinary tract.  She does not have a family medical history of nephrolithiasis, malignancies of the genitourinary tract or hematuria.  She did have a KUB on 06/03/2016 which was negative.  I have independently reviewed the films.  She is not a smoker.  She is not exposed to secondhand smoke.  She has not worked with Sports administrator.   CT urogram completed on 07/01/2016 noted that the distal half of the left ureter does not fill with contrast on the delayed images. No filling defect along the urothelium identified.  A cause for hematuria is not identified.  Suspected appendicolith, without inflammatory findings involving the appendix.   Left anterior uterine body subserosal fibroid.  Lumbar spondylosis and degenerative disc disease.  I have independently reviewed the films with the patient and her mother.  Today, she is experiencing frequency of urination. Her UA today was unremarkable.   PMH: Past Medical History:  Diagnosis Date  . Abnormal weight gain 02/24/2015  . Cerebral seizure 02/24/2015   dx at age 55.   . Changeable mood ([place]) 02/24/2015  . Epilepsy ([place]) 05/16/2009  . Episodic mood disorder ([place]) 09/11/2015  . Headache,  migraine 09/11/2015  . Hematuria 06/04/2016  . HLD (hyperlipidemia) 05/16/2009  . Intellectual disability 02/24/2015  . Seizure ([place]) 09/11/2015    Surgical History: Past Surgical History:  Procedure Laterality Date  . NO PAST SURGERIES      Home Medications:    Medication List       Accurate as of 07/04/16 11:59 PM. Always use your most recent med list.          acetaZOLAMIDE 250 MG tablet Commonly known as:  DIAMOX TAKE 1 TABLET BY MOUTH 3 TIMES A DAY   fluticasone 50 MCG/ACT nasal spray Commonly known as:  FLONASE Place into both nostrils daily.   folic acid 1 MG tablet Commonly known as:  FOLVITE TAKE 1 TABLET BY MOUTH ONCE DAILY   indomethacin 50 MG capsule Commonly known as:  INDOCIN Take one tablet every eight hours as needed for pain   KRILL OIL PO Take by mouth.   MULTIPLE VITAMIN PO 1 tablet daily.   OXcarbazepine 150 MG tablet Commonly known as:  TRILEPTAL Take 1 tablet by mouth 2 (two) times daily.   primidone 250 MG tablet Commonly known as:  MYSOLINE TAKE 1 TABLET BY MOUTH THREE TIMES DAILY AND 1/2 TABLET IN THE EVENING   Psyllium 48.57 % Powd METAMUCIL, 48.57% (Oral Powder)  As Directed for 0 days  Quantity: 0.00;  Refills: 0   Ordered :31-Aug-2010  Celene Kras, MA, Anastasiya ;  Started 15-Aug-2009 Active   Red Yeast Rice 600 MG Caps Take 1 capsule by mouth daily.  Allergies:  Allergies  Allergen Reactions  . Aspirin     Family History: Family History  Problem Relation Age of Onset  . Heart attack Father   . Stroke Maternal Grandmother   . Pneumonia Maternal Grandfather   . Cancer Paternal Grandfather   . Prostate cancer Neg Hx   . Kidney cancer Neg Hx     Social History:  reports that she has never smoked. She has never used smokeless tobacco. She reports that she does not drink alcohol or use drugs.  ROS: UROLOGY Frequent Urination?: Yes Hard to postpone urination?: No Burning/pain with urination?: No Get up at  night to urinate?: No Leakage of urine?: No Urine stream starts and stops?: No Trouble starting stream?: No Do you have to strain to urinate?: No Blood in urine?: Yes Urinary tract infection?: No Sexually transmitted disease?: No Injury to kidneys or bladder?: No Painful intercourse?: No Weak stream?: No Currently pregnant?: No Vaginal bleeding?: No Last menstrual period?: n  Gastrointestinal Nausea?: No Vomiting?: No Indigestion/heartburn?: No Diarrhea?: No Constipation?: No  Constitutional Fever: No Night sweats?: No Weight loss?: No Fatigue?: No  Skin Skin rash/lesions?: No Itching?: No  Eyes Blurred vision?: No Double vision?: No  Ears/Nose/Throat Sore throat?: No Sinus problems?: No  Hematologic/Lymphatic Swollen glands?: No Easy bruising?: Yes  Cardiovascular Leg swelling?: No Chest pain?: No  Respiratory Cough?: No Shortness of breath?: No  Endocrine Excessive thirst?: No  Musculoskeletal Back pain?: No Joint pain?: No  Neurological Headaches?: No Dizziness?: No  Psychologic Depression?: No Anxiety?: No  Physical Exam: BP 119/76   Pulse 68   Ht 5\' 5"  (1.651 m)   Wt 165 lb 8 oz (75.1 kg)   BMI 27.54 kg/m   Constitutional: Well nourished. Alert and oriented, No acute distress. HEENT: [place] AT, moist mucus membranes. Trachea midline, no masses. Cardiovascular: No clubbing, cyanosis, or edema. Respiratory: Normal respiratory effort, no increased work of breathing. GI: Abdomen is soft, non tender, non distended, no abdominal masses. Liver and spleen not palpable.  No hernias appreciated.  Stool sample for occult testing is not indicated.   GU: No CVA tenderness.  No bladder fullness or masses.   Skin: No rashes, bruises or suspicious lesions. Lymph: No cervical or inguinal adenopathy. Neurologic: Grossly intact, no focal deficits, moving all 4 extremities. Psychiatric: Normal mood and affect.  Laboratory Data: Lab Results    Component Value Date   WBC 7.1 09/19/2015   HGB 13.9 01/20/2015   HCT 40.2 09/19/2015   MCV 96 09/19/2015   PLT 273 09/19/2015    Lab Results  Component Value Date   CREATININE 0.58 06/20/2016    Lab Results  Component Value Date   TSH 2.870 09/19/2015       Component Value Date/Time   CHOL 217 (H) 09/19/2015 1037   HDL 65 09/19/2015 1037   CHOLHDL 3.3 09/19/2015 1037   LDLCALC 117 (H) 09/19/2015 1037    Lab Results  Component Value Date   AST 16 09/19/2015   Lab Results  Component Value Date   ALT 20 09/19/2015     Urinalysis Unremarkable. See EPIC.   Pertinent Imaging: CLINICAL DATA:  Gross hematuria 2 weeks ago. Urinary frequency. Right flank pain.  EXAM: CT ABDOMEN AND PELVIS WITHOUT AND WITH CONTRAST  TECHNIQUE: Multidetector CT imaging of the abdomen and pelvis was performed following the standard protocol before and following the bolus administration of intravenous contrast.  CONTRAST:  166mL ISOVUE-300 IOPAMIDOL (ISOVUE-300) INJECTION 61%  COMPARISON:  Radiographs from 06/03/2016  FINDINGS: Despite efforts by the technologist and patient, motion artifact is present on today's exam and could not be eliminated. This reduces exam sensitivity and specificity.  Lower chest: Unremarkable  Hepatobiliary: Unremarkable  Pancreas: Unremarkable  Spleen: Unremarkable  Adrenals/Urinary Tract: Adrenal glands normal. No abnormal renal parenchymal enhancement. The distal half of the left ureter does not fill with contrast on the delayed images. No filling defect along the urothelium identified.  Stomach/Bowel: High density in the appendix suggests appendicolith, without inflammatory findings.  Vascular/Lymphatic: Unremarkable  Reproductive: Left anterior uterine body subserosal fibroid, 2.6 cm in long axis.  Other: No supplemental non-categorized findings.  Musculoskeletal: L3 limbus vertebra. Scattered lumbar  spondylosis and degenerative disc disease including a right paracentral disc protrusion at the T12-L1 level.  IMPRESSION: 1. A cause for hematuria is not identified. 2. Suspected appendicolith, without inflammatory findings involving the appendix. 3. Left anterior uterine body subserosal fibroid. 4. Lumbar spondylosis and degenerative disc disease.   Electronically Signed   By: Van Clines M.D.   On: 07/01/2016 11:35  Assessment & Plan:    Patient will undergo cystoscopy with bilateral retrogrades in the operating room to complete her hematuria workup.  1. Gross hematuria  -  Patient has completed her CT urogram on 07/01/2016-no worrisome GU findings were discovered   -  I did explain to the patient and her mother that this study did lack some detail of excluding some urological tumors as the distal half of the left ureter did not fill with contrast on the delayed images  - Because the patient was unable to lay still for the CT study, her mother felt it was best that she undergo she would need to undergo cystoscopy with bilateral retrogrades in the OR to complete the hematuria workup   - I also explained the risks of general anesthesia, such as: MI, CVA, paralysis, coma and/or death.  - I described to the patient how the procedure is performed and the risk associated with the procedure, such as: Infection, bleeding, uncomfortableness for the first few days after the procedure, the possibility of a biopsy of an area of concern in the ureters or bladder and the possibility of a stent placement.  - Patient's mother voiced her understanding and wished to proceed     2. Right flank pain  - Most likely muscle skeletal in origin due to the protrusion at the T12-L1 level  - Patient and her mother are instructed to follow-up with her primary care physician for further evaluation of this finding on CT  Return for cystoscopy with bilateral retrogrades.  These notes generated with  voice recognition software. I apologize for typographical errors.  Zara Council, [facility] [address], [place], [place] [address] [phone]

## 2016-07-05 ENCOUNTER — Telehealth: Payer: Self-pay | Admitting: Radiology

## 2016-07-05 NOTE — Telephone Encounter (Signed)
LMOM. Need to notify pt of surgery information. 

## 2016-07-08 ENCOUNTER — Other Ambulatory Visit: Payer: Self-pay | Admitting: Radiology

## 2016-07-08 DIAGNOSIS — R310 Gross hematuria: Secondary | ICD-10-CM

## 2016-07-08 NOTE — Telephone Encounter (Signed)
Notified pt's mother, Kaeloni Roads, of surgery scheduled with Dr Erlene Quan on 07/16/16, pre-admit testing appt on 07/09/16 @9 :45 & to call day prior to surgery for arrival time to SDS. Remo Lipps voices understanding.

## 2016-07-09 ENCOUNTER — Encounter
Admission: RE | Admit: 2016-07-09 | Discharge: 2016-07-09 | Disposition: A | Discharge status: Home / Self Care | Payer: Medicare Other | Source: Ambulatory Visit | Attending: Urology | Admitting: Urology

## 2016-07-09 ENCOUNTER — Other Ambulatory Visit: Payer: Self-pay | Admitting: Family Medicine

## 2016-07-09 DIAGNOSIS — R310 Gross hematuria: Secondary | ICD-10-CM | POA: Insufficient documentation

## 2016-07-09 DIAGNOSIS — Z01812 Encounter for preprocedural laboratory examination: Secondary | ICD-10-CM | POA: Insufficient documentation

## 2016-07-09 HISTORY — DX: Personal history of urinary calculi: Z87.442

## 2016-07-09 HISTORY — DX: Family history of other specified conditions: Z84.89

## 2016-07-09 LAB — BASIC METABOLIC PANEL
ANION GAP: 7 (ref 5–15)
BUN: 8 mg/dL (ref 6–20)
CALCIUM: 9.3 mg/dL (ref 8.9–10.3)
CO2: 23 mmol/L (ref 22–32)
Chloride: 107 mmol/L (ref 101–111)
Creatinine, Ser: 0.49 mg/dL (ref 0.44–1.00)
Glucose, Bld: 105 mg/dL — ABNORMAL HIGH (ref 65–99)
POTASSIUM: 3.6 mmol/L (ref 3.5–5.1)
Sodium: 137 mmol/L (ref 135–145)

## 2016-07-09 LAB — CBC
HEMATOCRIT: 42.9 % (ref 35.0–47.0)
HEMOGLOBIN: 14.9 g/dL (ref 12.0–16.0)
MCH: 33.6 pg (ref 26.0–34.0)
MCHC: 34.7 g/dL (ref 32.0–36.0)
MCV: 96.9 fL (ref 80.0–100.0)
Platelets: 243 10*3/uL (ref 150–440)
RBC: 4.43 MIL/uL (ref 3.80–5.20)
RDW: 12.9 % (ref 11.5–14.5)
WBC: 6.9 10*3/uL (ref 3.6–11.0)

## 2016-07-09 LAB — CULTURE, URINE COMPREHENSIVE

## 2016-07-09 NOTE — Patient Instructions (Signed)
  Your procedure is scheduled [id number] Oct. 24 , 2017. Report to Same Day Surgery. To find out your arrival time please call [phone] between 1PM - 3PM on Monday Oct. 23, 2017.  Remember: Instructions that are not followed completely may result in serious medical risk, up to and including death, or upon the discretion of your surgeon and anesthesiologist your surgery may need to be rescheduled.    _x___ 1. Do not eat food or drink liquids after midnight. No gum chewing or hard candies.     ____ 2. No Alcohol for 24 hours before or after surgery.   ____ 3. Bring all medications with you on the day of surgery if instructed.    __x__ 4. Notify your doctor if there is any change in your medical condition     (cold, fever, infections).    _____ 5. No smoking 24 hours prior to surgery.     Do not wear jewelry, make-up, hairpins, clips or nail polish.  Do not wear lotions, powders, or perfumes.   Do not shave 48 hours prior to surgery. Men may shave face and neck.  Do not bring valuables to the hospital.    [facility] is not responsible for any belongings or valuables.               Contacts, dentures or bridgework may not be worn into surgery.  Leave your suitcase in the car. After surgery it may be brought to your room.  For patients admitted to the hospital, discharge time is determined by your treatment team.   Patients discharged the day of surgery will not be allowed to drive home.    Please read over the following fact sheets that you were given:   [facility] Preparing for Surgery  _x___ Take these medicines the morning of surgery with A SIP OF WATER:    1. acetaZOLAMIDE (DIAMOX)   2. OXcarbazepine (TRILEPTAL)  3. primidone (MYSOLINE)    ____ Fleet Enema (as directed)   ____ Use CHG Soap as directed on instruction sheet  ____ Use inhalers on the day of surgery and bring to hospital day of surgery  ____ Stop metformin 2 days prior to surgery    ____ Take 1/2 of  usual insulin dose the night before surgery and none on the morning of surgery.   ____ Stop Coumadin/Plavix/aspirin on does not apply.  _x__ Stop Anti-inflammatories such as Advil, Aleve, Ibuprofen, indomethacin (INDOCIN) Motrin, Naproxen, Naprosyn, Goodies powders or aspirin products. OK to  take Tylenol.   __x__ Stop supplements:KRILL OIL  until after surgery.    ____ Bring C-Pap to the hospital.

## 2016-07-11 ENCOUNTER — Other Ambulatory Visit: Payer: Self-pay | Admitting: Family Medicine

## 2016-07-11 NOTE — Telephone Encounter (Signed)
Per pharmacist they do have refills on hold. They will fill RX for patient and delete request.

## 2016-07-11 NOTE — Telephone Encounter (Signed)
Advise pharmacy we are still getting refill request. Dr. Melrose Nakayama refilled on 06-19-16 with 11 refills.

## 2016-07-16 ENCOUNTER — Encounter: Payer: Self-pay | Admitting: *Deleted

## 2016-07-16 ENCOUNTER — Ambulatory Visit: Payer: Medicare Other | Admitting: Registered Nurse

## 2016-07-16 ENCOUNTER — Encounter
Admission: RE | Disposition: A | Discharge status: Home / Self Care | Payer: Self-pay | Source: Ambulatory Visit | Attending: Urology

## 2016-07-16 ENCOUNTER — Ambulatory Visit
Admission: RE | Admit: 2016-07-16 | Discharge: 2016-07-16 | Disposition: A | Discharge status: Home / Self Care | Payer: Medicare Other | Source: Ambulatory Visit | Attending: Urology | Admitting: Urology

## 2016-07-16 DIAGNOSIS — F79 Unspecified intellectual disabilities: Secondary | ICD-10-CM | POA: Insufficient documentation

## 2016-07-16 DIAGNOSIS — Z7951 Long term (current) use of inhaled steroids: Secondary | ICD-10-CM | POA: Insufficient documentation

## 2016-07-16 DIAGNOSIS — R310 Gross hematuria: Secondary | ICD-10-CM | POA: Diagnosis not present

## 2016-07-16 DIAGNOSIS — E785 Hyperlipidemia, unspecified: Secondary | ICD-10-CM | POA: Diagnosis not present

## 2016-07-16 DIAGNOSIS — G40909 Epilepsy, unspecified, not intractable, without status epilepticus: Secondary | ICD-10-CM | POA: Diagnosis not present

## 2016-07-16 DIAGNOSIS — Z79899 Other long term (current) drug therapy: Secondary | ICD-10-CM | POA: Diagnosis not present

## 2016-07-16 HISTORY — PX: CYSTOSCOPY W/ RETROGRADES: SHX1426

## 2016-07-16 LAB — POCT PREGNANCY, URINE: PREG TEST UR: NEGATIVE

## 2016-07-16 SURGERY — CYSTOSCOPY, WITH RETROGRADE PYELOGRAM
Anesthesia: General

## 2016-07-16 MED ORDER — ONDANSETRON HCL 4 MG/2ML IJ SOLN
INTRAMUSCULAR | Status: DC | PRN
  Administered 2016-07-16: 4 mg via INTRAVENOUS

## 2016-07-16 MED ORDER — LACTATED RINGERS IV SOLN
INTRAVENOUS | Status: DC
  Administered 2016-07-16: 10:00:00 via INTRAVENOUS

## 2016-07-16 MED ORDER — MOXIFLOXACIN HCL 0.5 % OP SOLN
OPHTHALMIC | Status: AC
  Filled 2016-07-16: qty 3

## 2016-07-16 MED ORDER — FAMOTIDINE 20 MG PO TABS
20.0000 mg | ORAL_TABLET | Freq: Once | ORAL | Status: AC
  Administered 2016-07-16: 20 mg via ORAL

## 2016-07-16 MED ORDER — PROMETHAZINE HCL 25 MG/ML IJ SOLN: 6.2500 mg | INTRAMUSCULAR | Status: DC | PRN

## 2016-07-16 MED ORDER — MIDAZOLAM HCL 2 MG/2ML IJ SOLN
INTRAMUSCULAR | Status: DC | PRN
  Administered 2016-07-16: 2 mg via INTRAVENOUS

## 2016-07-16 MED ORDER — CEFAZOLIN SODIUM-DEXTROSE 2-4 GM/100ML-% IV SOLN
INTRAVENOUS | Status: AC
  Filled 2016-07-16: qty 100

## 2016-07-16 MED ORDER — PROPOFOL 10 MG/ML IV BOLUS
INTRAVENOUS | Status: DC | PRN
  Administered 2016-07-16: 160 mg via INTRAVENOUS

## 2016-07-16 MED ORDER — DEXAMETHASONE SODIUM PHOSPHATE 10 MG/ML IJ SOLN
INTRAMUSCULAR | Status: DC | PRN
  Administered 2016-07-16: 10 mg via INTRAVENOUS

## 2016-07-16 MED ORDER — CEFAZOLIN SODIUM-DEXTROSE 2-4 GM/100ML-% IV SOLN
2.0000 g | INTRAVENOUS | Status: AC
  Administered 2016-07-16: 2 g via INTRAVENOUS

## 2016-07-16 MED ORDER — FAMOTIDINE 20 MG PO TABS
ORAL_TABLET | ORAL | Status: AC
  Filled 2016-07-16: qty 1

## 2016-07-16 MED ORDER — ARMC OPHTHALMIC DILATING DROPS
OPHTHALMIC | Status: AC
  Filled 2016-07-16: qty 0.4

## 2016-07-16 MED ORDER — FENTANYL CITRATE (PF) 100 MCG/2ML IJ SOLN: 25.0000 ug | INTRAMUSCULAR | Status: DC | PRN

## 2016-07-16 MED ORDER — IOTHALAMATE MEGLUMINE 43 % IV SOLN
INTRAVENOUS | Status: DC | PRN
  Administered 2016-07-16: 20 mL

## 2016-07-16 MED ORDER — FENTANYL CITRATE (PF) 100 MCG/2ML IJ SOLN
INTRAMUSCULAR | Status: DC | PRN
  Administered 2016-07-16 (×2): 25 ug via INTRAVENOUS

## 2016-07-16 MED ORDER — LIDOCAINE HCL (CARDIAC) 20 MG/ML IV SOLN
INTRAVENOUS | Status: DC | PRN
  Administered 2016-07-16: 30 mg via INTRAVENOUS

## 2016-07-16 SURGICAL SUPPLY — 30 items
BAG DRAIN CYSTO-URO LG1000N (MISCELLANEOUS) ×4 IMPLANT
CATH FOL 2WAY LX 16X5 (CATHETERS) IMPLANT
CATH URETL 5X70 OPEN END (CATHETERS) ×4 IMPLANT
CONRAY 43 FOR UROLOGY 50M (MISCELLANEOUS) ×4 IMPLANT
CORD URO TURP 10FT (MISCELLANEOUS) ×4 IMPLANT
DRAPE UTILITY 15X26 TOWEL STRL (DRAPES) ×4 IMPLANT
ELECT REM PT RETURN 9FT ADLT (ELECTROSURGICAL) ×4
ELECTRODE REM PT RTRN 9FT ADLT (ELECTROSURGICAL) ×2 IMPLANT
GLOVE BIO SURGEON STRL SZ 6.5 (GLOVE) ×3 IMPLANT
GLOVE BIO SURGEON STRL SZ7 (GLOVE) ×8 IMPLANT
GLOVE BIO SURGEONS STRL SZ 6.5 (GLOVE) ×1
GOWN STRL REUS W/ TWL LRG LVL3 (GOWN DISPOSABLE) ×4 IMPLANT
GOWN STRL REUS W/ TWL LRG LVL4 (GOWN DISPOSABLE) ×4 IMPLANT
GOWN STRL REUS W/TWL LRG LVL3 (GOWN DISPOSABLE) ×4
GOWN STRL REUS W/TWL LRG LVL4 (GOWN DISPOSABLE) ×4
HOLDER FOLEY CATH W/STRAP (MISCELLANEOUS) IMPLANT
KIT RM TURNOVER CYSTO AR (KITS) ×4 IMPLANT
PACK CYSTO AR (MISCELLANEOUS) ×4 IMPLANT
PREP PVP WINGED SPONGE (MISCELLANEOUS) IMPLANT
SENSORWIRE 0.038 NOT ANGLED (WIRE) ×4
SET CYSTO W/LG BORE CLAMP LF (SET/KITS/TRAYS/PACK) ×4 IMPLANT
SOL .9 NS 3000ML IRR  AL (IV SOLUTION) ×2
SOL .9 NS 3000ML IRR UROMATIC (IV SOLUTION) ×2 IMPLANT
STENT URET 6FRX24 CONTOUR (STENTS) IMPLANT
STENT URET 6FRX26 CONTOUR (STENTS) IMPLANT
SURGILUBE 2OZ TUBE FLIPTOP (MISCELLANEOUS) ×4 IMPLANT
SYRINGE IRR TOOMEY STRL 70CC (SYRINGE) ×4 IMPLANT
WATER STERILE IRR 1000ML POUR (IV SOLUTION) ×4 IMPLANT
WATER STERILE IRR 3000ML UROMA (IV SOLUTION) ×4 IMPLANT
WIRE SENSOR 0.038 NOT ANGLED (WIRE) ×2 IMPLANT

## 2016-07-16 NOTE — Discharge Instructions (Signed)
Cystoscopy Cystoscopy is a procedure that is used to help your caregiver diagnose and sometimes treat conditions that affect your lower urinary tract. Your lower urinary tract includes your bladder and the tube through which urine passes from your bladder out of your body (urethra). Cystoscopy is performed with a thin, tube-shaped instrument (cystoscope). The cystoscope has lenses and a light at the end so that your caregiver can see inside your bladder. The cystoscope is inserted at the entrance of your urethra. Your caregiver guides it through your urethra and into your bladder. There are two main types of cystoscopy:  Flexible cystoscopy (with a flexible cystoscope).  Rigid cystoscopy (with a rigid cystoscope). Cystoscopy may be recommended for many conditions, including:  Urinary tract infections.  Blood in your urine (hematuria).  Loss of bladder control (urinary incontinence) or overactive bladder.  Unusual cells found in a urine sample.  Urinary blockage.  Painful urination. Cystoscopy may also be done to remove a sample of your tissue to be checked under a microscope (biopsy). It may also be done to remove or destroy bladder stones. LET YOUR CAREGIVER KNOW ABOUT:  Allergies to food or medicine.  Medicines taken, including vitamins, herbs, eyedrops, over-the-counter medicines, and creams.  Use of steroids (by mouth or creams).  Previous problems with anesthetics or numbing medicines.  History of bleeding problems or blood clots.  Previous surgery.  Other health problems, including diabetes and kidney problems.  Possibility of pregnancy, if this applies. PROCEDURE The area around the opening to your urethra will be cleaned. A medicine to numb your urethra (local anesthetic) is used. If a tissue sample or stone is removed during the procedure, you may be given a medicine to make you sleep (general anesthetic). Your caregiver will gently insert the tip of the cystoscope  into your urethra. The cystoscope will be slowly glided through your urethra and into your bladder. Sterile fluid will flow through the cystoscope and into your bladder. The fluid will expand and stretch your bladder. This gives your caregiver a better view of your bladder walls. The procedure lasts about 15-20 minutes. AFTER THE PROCEDURE If a local anesthetic is used, you will be allowed to go home as soon as you are ready. If a general anesthetic is used, you will be taken to a recovery area until you are stable. You may have temporary bleeding and burning on urination.   This information is not intended to replace advice given to you by your health care provider. Make sure you discuss any questions you have with your health care provider.     AMBULATORY SURGERY  DISCHARGE INSTRUCTIONS   1) The drugs that you were given will stay in your system until tomorrow so for the next 24 hours you should not:  A) Drive an automobile B) Make any legal decisions C) Drink any alcoholic beverage   2) You may resume regular meals tomorrow.  Today it is better to start with liquids and gradually work up to solid foods.  You may eat anything you prefer, but it is better to start with liquids, then soup and crackers, and gradually work up to solid foods.   3) Please notify your doctor immediately if you have any unusual bleeding, trouble breathing, redness and pain at the surgery site, drainage, fever, or pain not relieved by medication.

## 2016-07-16 NOTE — Interval H&P Note (Signed)
History and Physical Interval Note:  07/16/2016 10:08 AM  Wendy Green  has presented today for surgery, with the diagnosis of gross hematuria  The various methods of treatment have been discussed with the patient and family. After consideration of risks, benefits and other options for treatment, the patient has consented to  Procedure(s): CYSTOSCOPY WITH RETROGRADE PYELOGRAM (Bilateral) CYSTOSCOPY WITH BIOPSY (N/A) CYSTOSCOPY WITH STENT PLACEMENT (Bilateral) as a surgical intervention .  The patient's history has been reviewed, patient examined, no change in status, stable for surgery.  I have reviewed the patient's chart and labs.  Questions were answered to the patient's satisfaction.    RRR CTAB  Hollice Espy

## 2016-07-16 NOTE — Transfer of Care (Signed)
Immediate Anesthesia Transfer of Care Note  Patient: Wendy Green  Procedure(s) Performed: Procedure(s): CYSTOSCOPY WITH RETROGRADE PYELOGRAM (Bilateral)  Patient Location: PACU  Anesthesia Type:General  Level of Consciousness: awake  Airway & Oxygen Therapy: Patient Spontanous Breathing  Post-op Assessment: Report given to RN  Post vital signs: Reviewed and stable  Last Vitals:  Vitals:   07/16/16 0901  BP: 101/67  Pulse: 84  Resp: 16  Temp: 36.6 C    Last Pain:  Vitals:   07/16/16 0901  TempSrc: Oral         Complications: No apparent anesthesia complications

## 2016-07-16 NOTE — Anesthesia Preprocedure Evaluation (Signed)
Anesthesia Evaluation  Patient identified by MRN, date of birth, ID band Patient awake    Reviewed: Allergy & Precautions, H&P , NPO status , Patient's Chart, lab work & pertinent test results, reviewed documented beta blocker date and time   History of Anesthesia Complications (+) Family history of anesthesia reaction and history of anesthetic complications  Airway Mallampati: II  TM Distance: >3 FB Neck ROM: full    Dental no notable dental hx. (+) Teeth Intact   Pulmonary neg pulmonary ROS,           Cardiovascular Exercise Tolerance: Good negative cardio ROS       Neuro/Psych  Headaches, Seizures -,  PSYCHIATRIC DISORDERS (Mental retardation)    GI/Hepatic negative GI ROS, Neg liver ROS,   Endo/Other  negative endocrine ROS  Renal/GU Renal disease (kidney stones)  negative genitourinary   Musculoskeletal   Abdominal   Peds  Hematology negative hematology ROS (+)   Anesthesia Other Findings Past Medical History: 02/24/2015: Abnormal weight gain 02/24/2015: Cerebral seizure     Comment: dx at age 40.  02/24/2015: Changeable mood ([place]) 05/16/2009: Epilepsy ([place]) 09/11/2015: Episodic mood disorder ([place]) No date: Family history of adverse reaction to anesthes*     Comment: mother has nausea and vomiting. 09/11/2015: Headache, migraine 06/04/2016: Hematuria No date: History of kidney stones 05/16/2009: HLD (hyperlipidemia) 02/24/2015: Intellectual disability 09/11/2015: Seizure ([place])   Reproductive/Obstetrics negative OB ROS                             Anesthesia Physical Anesthesia Plan  ASA: II  Anesthesia Plan: General   Post-op Pain Management:    Induction:   Airway Management Planned:   Additional Equipment:   Intra-op Plan:   Post-operative Plan:   Informed Consent: I have reviewed the patients History and Physical, chart, labs and discussed the procedure including the  risks, benefits and alternatives for the proposed anesthesia with the patient or authorized representative who has indicated his/her understanding and acceptance.   Dental Advisory Given  Plan Discussed with: Anesthesiologist, CRNA and Surgeon  Anesthesia Plan Comments:         Anesthesia Quick Evaluation

## 2016-07-16 NOTE — OR Nursing (Signed)
Pt with mild mental retardation, mom Remo Lipps, legal guardian, present for preop.    Pt. Upset re SCD, will send wraps/machine to OR and have applied there.

## 2016-07-16 NOTE — Anesthesia Procedure Notes (Signed)
Procedure Name: LMA Insertion Date/Time: 07/16/2016 11:10 AM Performed by: Jonna Clark Pre-anesthesia Checklist: Patient identified, Patient being monitored, Timeout performed, Emergency Drugs available and Suction available Patient Re-evaluated:Patient Re-evaluated prior to inductionOxygen Delivery Method: Circle system utilized Preoxygenation: Pre-oxygenation with 100% oxygen Intubation Type: IV induction Ventilation: Mask ventilation without difficulty LMA: LMA inserted LMA Size: 3.5 Tube type: Oral Number of attempts: 1 Placement Confirmation: positive ETCO2 and breath sounds checked- equal and bilateral Tube secured with: Tape Dental Injury: Teeth and Oropharynx as per pre-operative assessment

## 2016-07-16 NOTE — H&P (View-Only) (Signed)
07/04/2016 9:10 PM   Wendy Green 09-10-1976 [id number]  Referring provider: Margo Common, [place], [place] [address]  Chief Complaint  Patient presents with  . Results    CT    HPI: Patient is a 40 year old Caucasian female with a intellectual disability who presents today with her mother to review the findings of her CT urogram performed for gross hematuria.  Background history Patient was a referral from their PCP, Vernie Murders, PA, for gross hematuria.  Patient had an episode of gross hematuria a few weeks ago.  Patient doesn't have a prior history of hematuria.   She does not have a prior history of recurrent urinary tract infections, nephrolithiasis, trauma to the genitourinary tract or malignancies of the genitourinary tract.  She does not have a family medical history of nephrolithiasis, malignancies of the genitourinary tract or hematuria.  She did have a KUB on 06/03/2016 which was negative.  I have independently reviewed the films.  She is not a smoker.  She is not exposed to secondhand smoke.  She has not worked with Sports administrator.   CT urogram completed on 07/01/2016 noted that the distal half of the left ureter does not fill with contrast on the delayed images. No filling defect along the urothelium identified.  A cause for hematuria is not identified.  Suspected appendicolith, without inflammatory findings involving the appendix.   Left anterior uterine body subserosal fibroid.  Lumbar spondylosis and degenerative disc disease.  I have independently reviewed the films with the patient and her mother.  Today, she is experiencing frequency of urination. Her UA today was unremarkable.   PMH: Past Medical History:  Diagnosis Date  . Abnormal weight gain 02/24/2015  . Cerebral seizure 02/24/2015   dx at age 61.   . Changeable mood ([place]) 02/24/2015  . Epilepsy ([place]) 05/16/2009  . Episodic mood disorder ([place]) 09/11/2015  . Headache,  migraine 09/11/2015  . Hematuria 06/04/2016  . HLD (hyperlipidemia) 05/16/2009  . Intellectual disability 02/24/2015  . Seizure ([place]) 09/11/2015    Surgical History: Past Surgical History:  Procedure Laterality Date  . NO PAST SURGERIES      Home Medications:    Medication List       Accurate as of 07/04/16 11:59 PM. Always use your most recent med list.          acetaZOLAMIDE 250 MG tablet Commonly known as:  DIAMOX TAKE 1 TABLET BY MOUTH 3 TIMES A DAY   fluticasone 50 MCG/ACT nasal spray Commonly known as:  FLONASE Place into both nostrils daily.   folic acid 1 MG tablet Commonly known as:  FOLVITE TAKE 1 TABLET BY MOUTH ONCE DAILY   indomethacin 50 MG capsule Commonly known as:  INDOCIN Take one tablet every eight hours as needed for pain   KRILL OIL PO Take by mouth.   MULTIPLE VITAMIN PO 1 tablet daily.   OXcarbazepine 150 MG tablet Commonly known as:  TRILEPTAL Take 1 tablet by mouth 2 (two) times daily.   primidone 250 MG tablet Commonly known as:  MYSOLINE TAKE 1 TABLET BY MOUTH THREE TIMES DAILY AND 1/2 TABLET IN THE EVENING   Psyllium 48.57 % Powd METAMUCIL, 48.57% (Oral Powder)  As Directed for 0 days  Quantity: 0.00;  Refills: 0   Ordered :31-Aug-2010  Celene Kras, MA, Anastasiya ;  Started 15-Aug-2009 Active   Red Yeast Rice 600 MG Caps Take 1 capsule by mouth daily.  Allergies:  Allergies  Allergen Reactions  . Aspirin     Family History: Family History  Problem Relation Age of Onset  . Heart attack Father   . Stroke Maternal Grandmother   . Pneumonia Maternal Grandfather   . Cancer Paternal Grandfather   . Prostate cancer Neg Hx   . Kidney cancer Neg Hx     Social History:  reports that she has never smoked. She has never used smokeless tobacco. She reports that she does not drink alcohol or use drugs.  ROS: UROLOGY Frequent Urination?: Yes Hard to postpone urination?: No Burning/pain with urination?: No Get up at  night to urinate?: No Leakage of urine?: No Urine stream starts and stops?: No Trouble starting stream?: No Do you have to strain to urinate?: No Blood in urine?: Yes Urinary tract infection?: No Sexually transmitted disease?: No Injury to kidneys or bladder?: No Painful intercourse?: No Weak stream?: No Currently pregnant?: No Vaginal bleeding?: No Last menstrual period?: n  Gastrointestinal Nausea?: No Vomiting?: No Indigestion/heartburn?: No Diarrhea?: No Constipation?: No  Constitutional Fever: No Night sweats?: No Weight loss?: No Fatigue?: No  Skin Skin rash/lesions?: No Itching?: No  Eyes Blurred vision?: No Double vision?: No  Ears/Nose/Throat Sore throat?: No Sinus problems?: No  Hematologic/Lymphatic Swollen glands?: No Easy bruising?: Yes  Cardiovascular Leg swelling?: No Chest pain?: No  Respiratory Cough?: No Shortness of breath?: No  Endocrine Excessive thirst?: No  Musculoskeletal Back pain?: No Joint pain?: No  Neurological Headaches?: No Dizziness?: No  Psychologic Depression?: No Anxiety?: No  Physical Exam: BP 119/76   Pulse 68   Ht 5\' 5"  (1.651 m)   Wt 165 lb 8 oz (75.1 kg)   BMI 27.54 kg/m   Constitutional: Well nourished. Alert and oriented, No acute distress. HEENT: [place] AT, moist mucus membranes. Trachea midline, no masses. Cardiovascular: No clubbing, cyanosis, or edema. Respiratory: Normal respiratory effort, no increased work of breathing. GI: Abdomen is soft, non tender, non distended, no abdominal masses. Liver and spleen not palpable.  No hernias appreciated.  Stool sample for occult testing is not indicated.   GU: No CVA tenderness.  No bladder fullness or masses.   Skin: No rashes, bruises or suspicious lesions. Lymph: No cervical or inguinal adenopathy. Neurologic: Grossly intact, no focal deficits, moving all 4 extremities. Psychiatric: Normal mood and affect.  Laboratory Data: Lab Results    Component Value Date   WBC 7.1 09/19/2015   HGB 13.9 01/20/2015   HCT 40.2 09/19/2015   MCV 96 09/19/2015   PLT 273 09/19/2015    Lab Results  Component Value Date   CREATININE 0.58 06/20/2016    Lab Results  Component Value Date   TSH 2.870 09/19/2015       Component Value Date/Time   CHOL 217 (H) 09/19/2015 1037   HDL 65 09/19/2015 1037   CHOLHDL 3.3 09/19/2015 1037   LDLCALC 117 (H) 09/19/2015 1037    Lab Results  Component Value Date   AST 16 09/19/2015   Lab Results  Component Value Date   ALT 20 09/19/2015     Urinalysis Unremarkable. See EPIC.   Pertinent Imaging: CLINICAL DATA:  Gross hematuria 2 weeks ago. Urinary frequency. Right flank pain.  EXAM: CT ABDOMEN AND PELVIS WITHOUT AND WITH CONTRAST  TECHNIQUE: Multidetector CT imaging of the abdomen and pelvis was performed following the standard protocol before and following the bolus administration of intravenous contrast.  CONTRAST:  190mL ISOVUE-300 IOPAMIDOL (ISOVUE-300) INJECTION 61%  COMPARISON:  Radiographs from 06/03/2016  FINDINGS: Despite efforts by the technologist and patient, motion artifact is present on today's exam and could not be eliminated. This reduces exam sensitivity and specificity.  Lower chest: Unremarkable  Hepatobiliary: Unremarkable  Pancreas: Unremarkable  Spleen: Unremarkable  Adrenals/Urinary Tract: Adrenal glands normal. No abnormal renal parenchymal enhancement. The distal half of the left ureter does not fill with contrast on the delayed images. No filling defect along the urothelium identified.  Stomach/Bowel: High density in the appendix suggests appendicolith, without inflammatory findings.  Vascular/Lymphatic: Unremarkable  Reproductive: Left anterior uterine body subserosal fibroid, 2.6 cm in long axis.  Other: No supplemental non-categorized findings.  Musculoskeletal: L3 limbus vertebra. Scattered lumbar  spondylosis and degenerative disc disease including a right paracentral disc protrusion at the T12-L1 level.  IMPRESSION: 1. A cause for hematuria is not identified. 2. Suspected appendicolith, without inflammatory findings involving the appendix. 3. Left anterior uterine body subserosal fibroid. 4. Lumbar spondylosis and degenerative disc disease.   Electronically Signed   By: Van Clines M.D.   On: 07/01/2016 11:35  Assessment & Plan:    Patient will undergo cystoscopy with bilateral retrogrades in the operating room to complete her hematuria workup.  1. Gross hematuria  -  Patient has completed her CT urogram on 07/01/2016-no worrisome GU findings were discovered   -  I did explain to the patient and her mother that this study did lack some detail of excluding some urological tumors as the distal half of the left ureter did not fill with contrast on the delayed images  - Because the patient was unable to lay still for the CT study, her mother felt it was best that she undergo she would need to undergo cystoscopy with bilateral retrogrades in the OR to complete the hematuria workup   - I also explained the risks of general anesthesia, such as: MI, CVA, paralysis, coma and/or death.  - I described to the patient how the procedure is performed and the risk associated with the procedure, such as: Infection, bleeding, uncomfortableness for the first few days after the procedure, the possibility of a biopsy of an area of concern in the ureters or bladder and the possibility of a stent placement.  - Patient's mother voiced her understanding and wished to proceed     2. Right flank pain  - Most likely muscle skeletal in origin due to the protrusion at the T12-L1 level  - Patient and her mother are instructed to follow-up with her primary care physician for further evaluation of this finding on CT  Return for cystoscopy with bilateral retrogrades.  These notes generated with  voice recognition software. I apologize for typographical errors.  Zara Council, [facility] [address] [place], [place] [address] [phone]

## 2016-07-16 NOTE — Op Note (Signed)
Date of procedure: 07/16/16  Preoperative diagnosis:  1. Gross hematuria   Postoperative diagnosis:  1. Same as above   Procedure: 1. Cystoscopy 2. Bilateral retrograde pyelogram  Surgeon: Hollice Espy, MD  Anesthesia: General  Complications: None  Intraoperative findings: Unremarkable cystoscopy and bilateral retrograde pyelogram.  EBL: Minimal   Specimens: None  Drains: None  Indication: Wendy Green is a 40 y.o. patient with possible episode of gross hematuria who underwent CT urogram. Unfortunately, the distal ureters did not opacify distally therefore the study was incomplete.  After reviewing the management options for treatment,s he elected to proceed with the above surgical procedure(s). We have discussed the potential benefits and risks of the procedure, side effects of the proposed treatment, the likelihood of the patient achieving the goals of the procedure, and any potential problems that might occur during the procedure or recuperation. Informed consent has been obtained.  Description of procedure:  The patient was taken to the operating room and general anesthesia was induced.  The patient was placed in the dorsal lithotomy position, prepped and draped in the usual sterile fashion, and preoperative antibiotics were administered. A preoperative time-out was performed.   A 21 French scope was advanced per urethra into the bladder. Careful inspection of the bladder revealed normal smooth urothelium without any lesions, ulcerations, tumors, or stones. The trigone was unremarkable with anatomically positioned ureteral orifice ease. Attention was then turned to the right ureteral orifice which was cannulated using a 5 [place] open-ended ureteral catheter just within the UO. A gentle retrograde pyelogram was performed which revealed a delicate appearing ureter and a normal upper tract collecting system without any filling defects. The same exact procedure was then  performed on the left side again with a normal retrograde pyelogram, decompressed ureter without any filling defects and a normal upper tract. At this point, the bladder was drained and the scope was removed. She was repositioned supine position, reversed from anesthesia, taken to the PACU in stable condition. There are no other complications.  Plan: Findings were discussed with the patient's family member today. Upon further elicitation of her history, it sounds like his episode of hematuria may in fact a benign vaginal bleeding. I have encouraged her to follow-up with OB/GYN for further investigation. Negative findings were discussed in detail. She may follow-up as needed.  Hollice Espy, M.D.

## 2016-07-17 NOTE — Anesthesia Postprocedure Evaluation (Signed)
Anesthesia Post Note  Patient: Wendy Green  Procedure(s) Performed: Procedure(s) (LRB): CYSTOSCOPY WITH RETROGRADE PYELOGRAM (Bilateral)  Patient location during evaluation: PACU Anesthesia Type: General Level of consciousness: awake and alert Pain management: pain level controlled Vital Signs Assessment: post-procedure vital signs reviewed and stable Respiratory status: spontaneous breathing, nonlabored ventilation, respiratory function stable and patient connected to nasal cannula oxygen Cardiovascular status: blood pressure returned to baseline and stable Postop Assessment: no signs of nausea or vomiting Anesthetic complications: no    Last Vitals:  Vitals:   07/16/16 1217 07/16/16 1233  BP: 124/67 99/60  Pulse: 73 78  Resp: 14 14  Temp: 36.8 C     Last Pain:  Vitals:   07/16/16 1217  TempSrc: Temporal                 Martha Clan

## 2016-08-08 ENCOUNTER — Ambulatory Visit (INDEPENDENT_AMBULATORY_CARE_PROVIDER_SITE_OTHER): Payer: Medicare Other | Admitting: Obstetrics and Gynecology

## 2016-08-08 ENCOUNTER — Encounter: Payer: Self-pay | Admitting: Obstetrics and Gynecology

## 2016-08-08 VITALS — BP 108/73 | HR 89 | Ht 65.0 in | Wt 166.4 lb

## 2016-08-08 DIAGNOSIS — N938 Other specified abnormal uterine and vaginal bleeding: Secondary | ICD-10-CM

## 2016-08-08 DIAGNOSIS — N926 Irregular menstruation, unspecified: Secondary | ICD-10-CM

## 2016-08-08 DIAGNOSIS — G809 Cerebral palsy, unspecified: Secondary | ICD-10-CM

## 2016-08-08 DIAGNOSIS — Z124 Encounter for screening for malignant neoplasm of cervix: Secondary | ICD-10-CM

## 2016-08-08 DIAGNOSIS — Z8742 Personal history of other diseases of the female genital tract: Secondary | ICD-10-CM

## 2016-08-08 DIAGNOSIS — D252 Subserosal leiomyoma of uterus: Secondary | ICD-10-CM | POA: Diagnosis not present

## 2016-08-08 NOTE — Progress Notes (Signed)
GYNECOLOGY CLINIC PROGRESS NOTE  Subjective:     Wendy Green is an 40 y.o. P0 female with cerebral palsy who presents for irregular menses. Most information received from patient's mother who is present for visit.  Last normal period was in August. Now periods are with bright red blood, sporadic, not associated cramping. C/o back pain in September.  Has undergone workup witu [facility] for hematuria with normal findings, except possible fibroid and bulging disc on CT scan.  The patient has a long-term h/o painful heavy periods. Cycles usually last 5-6 days. Uses overnight pads, changes 5-6 times daily.  Passage of very small clots.  The patient has never been sexually active. Current contraception: none. History of infertility: undetermined. History of abnormal Pap smear: no, patient has never had a pap smear.   Menstrual History: OB History    Gravida Para Term Preterm AB Living   0 0 0 0 0 0   SAB TAB Ectopic Multiple Live Births   0 0 0 0 0      Menarche age: 28 Patient's last menstrual period was 05/16/2016. Period Pattern: (!) Irregular Menstrual Flow: Heavy Dysmenorrhea: (!) Severe Dysmenorrhea Symptoms: Cramping   Past Medical History:  Diagnosis Date  . Abnormal weight gain 02/24/2015  . Cerebral seizure 02/24/2015   dx at age 87.   . Changeable mood ([place]) 02/24/2015  . Epilepsy ([place]) 05/16/2009  . Episodic mood disorder ([place]) 09/11/2015  . Family history of adverse reaction to anesthesia    mother has nausea and vomiting.  Marland Kitchen Headache, migraine 09/11/2015  . Hematuria 06/04/2016  . History of kidney stones   . HLD (hyperlipidemia) 05/16/2009  . Intellectual disability 02/24/2015  . Seizure ([place]) 09/11/2015    Family History  Problem Relation Age of Onset  . Heart attack Father   . Stroke Maternal Grandmother   . Pneumonia Maternal Grandfather   . Cancer Paternal Grandfather   . Prostate cancer Neg Hx   . Kidney cancer Neg Hx     Past Surgical History:  Procedure  Laterality Date  . CYSTOSCOPY W/ RETROGRADES Bilateral 07/16/2016   Procedure: CYSTOSCOPY WITH RETROGRADE PYELOGRAM;  Surgeon: Hollice Espy, MD;  Location: [facility] ORS;  Service: Urology;  Laterality: Bilateral;  . NO PAST SURGERIES     Social History   Social History  . Marital status: Single    Spouse name: N/A  . Number of children: N/A  . Years of education: N/A   Occupational History  . Not on file.   Social History Main Topics  . Smoking status: Never Smoker  . Smokeless tobacco: Never Used  . Alcohol use No  . Drug use: No  . Sexual activity: Not Currently    Birth control/ protection: None   Other Topics Concern  . Not on file   Social History Narrative  . No narrative on file    Current Outpatient Prescriptions on File Prior to Visit  Medication Sig Dispense Refill  . acetaZOLAMIDE (DIAMOX) 250 MG tablet TAKE 1 TABLET BY MOUTH 3 TIMES A DAY 90 tablet 3  . fluticasone (FLONASE) 50 MCG/ACT nasal spray Place into both nostrils daily.    . folic acid (FOLVITE) 1 MG tablet TAKE 1 TABLET BY MOUTH ONCE DAILY    . indomethacin (INDOCIN) 50 MG capsule Take one tablet every eight hours as needed for pain 10 capsule 0  . KRILL OIL PO Take 1 capsule by mouth 2 (two) times daily.     . MULTIPLE  VITAMIN PO 1 tablet daily.    . primidone (MYSOLINE) 250 MG tablet TAKE 1 TABLET BY MOUTH THREE TIMES DAILY AND 1/2 TABLET IN THE EVENING 100 tablet 3  . Psyllium 500 MG CAPS 1 capsule 2 (two) times daily. METAMUCIL, 48.57% (Oral Powder)  As Directed for 0 days  Quantity: 0.00;  Refills: 0   Ordered :31-Aug-2010  Celene Kras, MA, Anastasiya ;  Started 15-Aug-2009 Active    . OXcarbazepine (TRILEPTAL) 150 MG tablet Take 1 tablet by mouth 2 (two) times daily.     No current facility-administered medications on file prior to visit.     Allergies  Allergen Reactions  . Aspirin      Review of Systems Pertinent items noted in HPI and remainder of comprehensive ROS otherwise negative.      Objective:    BP 108/73 (BP Location: Left Arm, Patient Position: Sitting, Cuff Size: Large)   Pulse 89   Ht 5\' 5"  (1.651 m)   Wt 166 lb 6.4 oz (75.5 kg)   LMP 05/16/2016   BMI 27.69 kg/m   General:   alert and no distress  Skin:    normal  Neck:  no adenopathy, no carotid bruit, no JVD, supple, symmetrical, trachea midline and thyroid not enlarged, symmetric, no tenderness/mass/nodules  Abdomen:  soft, non-tender; bowel sounds normal; no masses,  no organomegaly  Pelvic:   external genitalia normal, rectovaginal septum normal.  Vagina without discharge.  Cervix normal appearing, no lesions and no motion tenderness.  Uterus mobile, nontender, normal shape and size.  Adnexae non-palpable, nontender bilaterally.         Labs:  Lab Results  Component Value Date   WBC 6.9 07/09/2016   HGB 14.9 07/09/2016   HCT 42.9 07/09/2016   MCV 96.9 07/09/2016   PLT 243 07/09/2016   Lab Results  Component Value Date   TSH 2.870 09/19/2015     Imaging:  CLINICAL DATA:  Gross hematuria 2 weeks ago. Urinary frequency. Right flank pain.  EXAM (07/01/2016): CT ABDOMEN AND PELVIS WITHOUT AND WITH CONTRAST  TECHNIQUE: Multidetector CT imaging of the abdomen and pelvis was performed following the standard protocol before and following the bolus administration of intravenous contrast.  CONTRAST:  160mL ISOVUE-300 IOPAMIDOL (ISOVUE-300) INJECTION 61%  COMPARISON:  Radiographs from 06/03/2016  FINDINGS: Despite efforts by the technologist and patient, motion artifact is present on today's exam and could not be eliminated. This reduces exam sensitivity and specificity.  Lower chest: Unremarkable  Hepatobiliary: Unremarkable  Pancreas: Unremarkable  Spleen: Unremarkable  Adrenals/Urinary Tract: Adrenal glands normal. No abnormal renal parenchymal enhancement. The distal half of the left ureter does not fill with contrast on the delayed images. No filling defect along the  urothelium identified.  Stomach/Bowel: High density in the appendix suggests appendicolith, without inflammatory findings.  Vascular/Lymphatic: Unremarkable  Reproductive: Left anterior uterine body subserosal fibroid, 2.6 cm in long axis.  Other: No supplemental non-categorized findings.  Musculoskeletal: L3 limbus vertebra. Scattered lumbar spondylosis and degenerative disc disease including a right paracentral disc protrusion at the T12-L1 level.  IMPRESSION: 1. A cause for hematuria is not identified. 2. Suspected appendicolith, without inflammatory findings involving the appendix. 3. Left anterior uterine body subserosal fibroid. 4. Lumbar spondylosis and degenerative disc disease.  Assessment:   Dysfunctional uterine bleeding Irregular menses   H/o menorrhagia Fibroid uterus Cerebral palsy  No prior pap history  Plan:   - Diagnosis explained in detail, including differential.  Patient has abnormal uterine bleeding . She has  a normal exam, no evidence of lesions. Recent CT scan notes evidence of a 2.6 cm subserosal fibroid, not likely cause of abnormal bleeding due to location and size. May likely be due to hormonal dysfunction. Briefly discussed hormonal regulation as patient also with h/o heavy menses.  Patient has never been sexually active, appears to have a mild mental delay.  No likelihood of pregnancy or STDs as cause of bleeding.  - Abdominal ultrasound ordered to assess for other uterine pathology.  Patient was unable to tolerate speculum exam, will likely not be able to tolerate vaginal sono. - Pap smear performed today.  Await pap smear results. - RTC in 2-3 weeks for f/u of ultrasound results and discussion of further management.    Rubie Maid, MD [facility]

## 2016-08-08 NOTE — Patient Instructions (Signed)
You need to drink 32 oz of water 1 hour prior to your scheduled ultrasound.

## 2016-08-13 LAB — PAP IG AND HPV HIGH-RISK
HPV, HIGH-RISK: NEGATIVE
PAP Smear Comment: 0

## 2016-08-14 ENCOUNTER — Telehealth: Payer: Self-pay

## 2016-08-14 NOTE — Telephone Encounter (Signed)
Called pt's mother (as pt has some mental delay) informed pt's mother of normal pap. Mother gave verbal understanding.

## 2016-08-14 NOTE — Telephone Encounter (Signed)
-----   Message from Rubie Maid, MD sent at 08/13/2016 10:42 AM EST ----- Please inform of normal pap smear

## 2016-08-29 ENCOUNTER — Encounter: Payer: Medicare Other | Admitting: Obstetrics and Gynecology

## 2016-08-29 ENCOUNTER — Ambulatory Visit (INDEPENDENT_AMBULATORY_CARE_PROVIDER_SITE_OTHER): Payer: Medicare Other

## 2016-08-29 DIAGNOSIS — N938 Other specified abnormal uterine and vaginal bleeding: Secondary | ICD-10-CM

## 2016-10-17 ENCOUNTER — Encounter: Payer: Self-pay | Admitting: Physician Assistant

## 2016-10-17 ENCOUNTER — Ambulatory Visit (INDEPENDENT_AMBULATORY_CARE_PROVIDER_SITE_OTHER): Payer: Medicare Other | Admitting: Physician Assistant

## 2016-10-17 VITALS — BP 100/70 | HR 76 | Temp 97.6°F | Resp 16 | Wt 170.0 lb

## 2016-10-17 DIAGNOSIS — H66002 Acute suppurative otitis media without spontaneous rupture of ear drum, left ear: Secondary | ICD-10-CM | POA: Diagnosis not present

## 2016-10-17 MED ORDER — AMOXICILLIN 500 MG PO CAPS: 500.0000 mg | ORAL_CAPSULE | Freq: Two times a day (BID) | ORAL | 0 refills | Status: AC

## 2016-10-17 NOTE — Progress Notes (Signed)
Patient: Wendy Green Female    DOB: [birth year]   41 y.o.   MRN: [id number] Visit Date: 10/17/2016  Today's Provider: Trinna Post, [name]   Chief Complaint  Patient presents with  . Ear Drainage   Subjective:    HPI Ear discharge: Patient presents with left ear discharge.  Symptoms include left ear drainage . Symptoms began 2 days ago and are unchanged since that time. Patient denies nasal congestion. No pain, fever, tenderness in ear. No nausea, vomiting, dizziness.      Allergies  Allergen Reactions  . Aspirin      Current Outpatient Prescriptions:  .  acetaZOLAMIDE (DIAMOX) 250 MG tablet, TAKE 1 TABLET BY MOUTH 3 TIMES A DAY, Disp: 90 tablet, Rfl: 3 .  fluticasone (FLONASE) 50 MCG/ACT nasal spray, Place into both nostrils daily., Disp: , Rfl:  .  folic acid (FOLVITE) 1 MG tablet, TAKE 1 TABLET BY MOUTH ONCE DAILY, Disp: , Rfl:  .  KRILL OIL PO, Take 1 capsule by mouth 2 (two) times daily. , Disp: , Rfl:  .  MULTIPLE VITAMIN PO, 1 tablet daily., Disp: , Rfl:  .  OXcarbazepine (TRILEPTAL) 150 MG tablet, Take 1 tablet by mouth 2 (two) times daily., Disp: , Rfl:  .  primidone (MYSOLINE) 250 MG tablet, TAKE 1 TABLET BY MOUTH THREE TIMES DAILY AND 1/2 TABLET IN THE EVENING, Disp: 100 tablet, Rfl: 3 .  Psyllium 500 MG CAPS, 1 capsule 2 (two) times daily. METAMUCIL, 48.57% (Oral Powder)  As Directed for 0 days  Quantity: 0.00;  Refills: 0   Ordered :31-Aug-2010  Celene Kras, MA, Anastasiya ;  Started 15-Aug-2009 Active, Disp: , Rfl:   Review of Systems  Constitutional: Negative.   HENT: Positive for ear discharge.   Respiratory: Negative.   Cardiovascular: Negative.   Allergic/Immunologic: Positive for environmental allergies.    Social History  Substance Use Topics  . Smoking status: Never Smoker  . Smokeless tobacco: Never Used  . Alcohol use No   Objective:   BP 100/70 (BP Location: Right Arm, Patient Position: Sitting, Cuff Size: Large)   Pulse 76    Temp 97.6 F (36.4 C) (Oral)   Resp 16   Wt 170 lb (77.1 kg)   SpO2 98%   BMI 28.29 kg/m   Physical Exam  Constitutional: She appears well-developed and well-nourished.  HENT:  Right Ear: Tympanic membrane and external ear normal.  Left Ear: There is drainage. No tenderness. No mastoid tenderness. Tympanic membrane is not injected.  Right ear normal. Left ear canal filled with pus like drainage. TM is not bulging but appears opaque with pus ike drainage behind it as well. Do not see a perforation.         Assessment & Plan:     1. Acute suppurative otitis media of left ear without spontaneous rupture of tympanic membrane, recurrence not specified  Will treat as below. Patient or her mother to call back if not improving.  - amoxicillin (AMOXIL) 500 MG capsule; Take 1 capsule (500 mg total) by mouth 2 (two) times daily.  Dispense: 14 capsule; Refill: 0  No Follow-up on file.   Patient Instructions  Otitis Media, Adult Otitis media is redness, soreness, and puffiness (swelling) in the space just behind your eardrum (middle ear). It may be caused by allergies or infection. It often happens along with a cold. Follow these instructions at home:  Take your medicine as told. Finish it even if you  start to feel better.  Only take over-the-counter or prescription medicines for pain, discomfort, or fever as told by your doctor.  Follow up with your doctor as told. Contact a doctor if:  You have otitis media only in one ear, or bleeding from your nose, or both.  You notice a lump on your neck.  You are not getting better in 3-5 days.  You feel worse instead of better. Get help right away if:  You have pain that is not helped with medicine.  You have puffiness, redness, or pain around your ear.  You get a stiff neck.  You cannot move part of your face (paralysis).  You notice that the bone behind your ear hurts when you touch it. This information is not intended to  replace advice given to you by your health care provider. Make sure you discuss any questions you have with your health care provider. Document Released: 02/26/2008 Document Revised: 02/15/2016 Document Reviewed: 04/06/2013 Elsevier Interactive Patient Education  2017 [employer].   The entirety of the information documented in the History of Present Illness, Review of Systems and Physical Exam were personally obtained by me. Portions of this information were initially documented by Lynford Humphrey, CMA and reviewed by me for thoroughness and accuracy.          Trinna Post, [name]  [facility]

## 2016-10-17 NOTE — Patient Instructions (Signed)
Otitis Media, Adult Otitis media is redness, soreness, and puffiness (swelling) in the space just behind your eardrum (middle ear). It may be caused by allergies or infection. It often happens along with a cold. Follow these instructions at home:  Take your medicine as told. Finish it even if you start to feel better.  Only take over-the-counter or prescription medicines for pain, discomfort, or fever as told by your doctor.  Follow up with your doctor as told. Contact a doctor if:  You have otitis media only in one ear, or bleeding from your nose, or both.  You notice a lump on your neck.  You are not getting better in 3-5 days.  You feel worse instead of better. Get help right away if:  You have pain that is not helped with medicine.  You have puffiness, redness, or pain around your ear.  You get a stiff neck.  You cannot move part of your face (paralysis).  You notice that the bone behind your ear hurts when you touch it. This information is not intended to replace advice given to you by your health care provider. Make sure you discuss any questions you have with your health care provider. Document Released: 02/26/2008 Document Revised: 02/15/2016 Document Reviewed: 04/06/2013 Elsevier Interactive Patient Education  2017 [employer]vier Inc.  

## 2016-10-22 ENCOUNTER — Ambulatory Visit (INDEPENDENT_AMBULATORY_CARE_PROVIDER_SITE_OTHER): Payer: Medicare Other | Admitting: Obstetrics and Gynecology

## 2016-10-22 ENCOUNTER — Encounter: Payer: Self-pay | Admitting: Obstetrics and Gynecology

## 2016-10-22 ENCOUNTER — Telehealth: Payer: Self-pay | Admitting: Family Medicine

## 2016-10-22 VITALS — BP 111/73 | HR 73 | Ht 65.0 in | Wt 171.5 lb

## 2016-10-22 DIAGNOSIS — H6690 Otitis media, unspecified, unspecified ear: Secondary | ICD-10-CM

## 2016-10-22 DIAGNOSIS — D259 Leiomyoma of uterus, unspecified: Secondary | ICD-10-CM

## 2016-10-22 DIAGNOSIS — N926 Irregular menstruation, unspecified: Secondary | ICD-10-CM

## 2016-10-22 DIAGNOSIS — F819 Developmental disorder of scholastic skills, unspecified: Secondary | ICD-10-CM

## 2016-10-22 DIAGNOSIS — Z8669 Personal history of other diseases of the nervous system and sense organs: Secondary | ICD-10-CM

## 2016-10-22 MED ORDER — NORETHINDRONE 0.35 MG PO TABS: 1.0000 | ORAL_TABLET | Freq: Every day | ORAL | 11 refills | Status: DC

## 2016-10-22 MED ORDER — CIPROFLOXACIN-DEXAMETHASONE 0.3-0.1 % OT SUSP: 4.0000 [drp] | Freq: Two times a day (BID) | OTIC | 0 refills | Status: AC

## 2016-10-22 NOTE — Telephone Encounter (Signed)
Sent in Ciprodex ear drops, 4 drops in left ear twice per day for one week. Let us know how she does with this.

## 2016-10-22 NOTE — Patient Instructions (Signed)

## 2016-10-22 NOTE — Telephone Encounter (Signed)
Pt's mom Remo Lipps stated that pt is still complaining of ear ache and she only has 3 tablets of the amoxicillin (AMOXIL) 500 MG capsule left. Mom is concerned that this might not knock out the infection and wanted to see if they should try something else or ear drop. [facility]. Please advise. Thanks TNP

## 2016-10-22 NOTE — Progress Notes (Addendum)
    GYNECOLOGY PROGRESS NOTE  Subjective:    Patient ID: Wendy Green, female    DOB: 1976/07/09, 41 y.o.   MRN: [id number]  HPI  Patient is a 41 y.o. G72P0000 female who presents for f/u of ultrasound results.  Patient initially presented for workup of irregular, heavy, painful menses. Currently denies complaints today.     The following portions of the patient's history were reviewed and updated as appropriate: allergies, current medications, past family history, past medical history, past social history, past surgical history and problem list.  Review of Systems Pertinent items noted in HPI and remainder of comprehensive ROS otherwise negative.   Objective:   Blood pressure 111/73, pulse 73, height 5\' 5"  (1.651 m), weight 171 lb 8 oz (77.8 kg). General appearance: alert and no distress Remainder of exam deferred.    Pathology:  08/08/16: Pap smear NILM   Imaging:  ULTRASOUND REPORT  Location: [facility] Date of Service: 08/29/16   Indications: DUB with possible Fibroids Findings:  The uterus is anteverted and measures 7.4 x 3.8 x 6.1 cm.  Echo texture is heterogenous with evidence of a focal mass. Within the uterus is a suspected fibroid measuring: Fibroid 1: 2.9 x 1.9 x 2.3 cm. This fibroid appears pedunculated and on the anterior fundal left of the uterus.  The Endometrium appears WNL and measures 5.3 mm.  Right Ovary measures 3.6 x 2.3 x 2.5 cm. There is a 2.6 x 2.3 x 2.3 cm simple appearing cyst seen, otherwise, appears WNL. The left ovary was not visualized.  Survey of the adnexa demonstrates no adnexal masses. There is no free fluid in the cul de sac.  Impression: 1. Fibroid uterus. 2. Simple appearing right ovarian cyst, small.   Recommendations: 1.Clinical correlation with the patient's History and Physical Exam.  Assessment:   Uterine fibroid (pedunculated) Irregular menses Mild cognitive delay H/o seizure disorder  Plan:     - Discussed management options with patient regarding irregular menses and fibroid uterus. Discussed oral medications (OCPs, progesterone only tablets), Depo Provera, Mirena IUD, endometrial ablation (Novasure/Hydrothermal Ablation) or hysterectomy as definitive surgical management.  Discussed risks and benefits of each method.   Patient desires progesterone OCPs (Micronor prescribed).  Printed patient education handouts were given to the patient to review at home.  If symptoms do not improve, will need to perform further workup with endometrial biopsy.  - Discussed that with OCPs, patient may have slightly decreased efficacy due to meds for seizure disorder.  Patient not sexually active, but may also affect the effectiveness of OCP for management of menses, including irregular or breakthrough spotting.  Patient and mother note understanding.  - To f/u in 2-3 months to reassess symptoms.   A total of 15 minutes were spent face-to-face with the patient during this encounter and over half of that time dealt with counseling and coordination of care.   Rubie Maid, MD [facility]

## 2016-10-22 NOTE — Telephone Encounter (Signed)
LMTCB ED 

## 2016-10-22 NOTE — Telephone Encounter (Signed)
Please advise  ED?

## 2016-10-25 NOTE — Telephone Encounter (Signed)
Patient's mother advised and they have been using the drops and she is improving it seems-aa

## 2016-10-25 NOTE — Telephone Encounter (Signed)
[id number]-aa 

## 2016-11-27 ENCOUNTER — Telehealth: Payer: Self-pay

## 2016-11-27 NOTE — Telephone Encounter (Signed)
Pt's mother is requesting labs to check seizure medication levels. Mother also verifying that pt can seizure medication prior to having labs done? Please advise. CB# [phone]. Renaldo Fiddler, CMA

## 2016-11-28 ENCOUNTER — Other Ambulatory Visit: Payer: Self-pay | Admitting: Family Medicine

## 2016-11-28 DIAGNOSIS — G40909 Epilepsy, unspecified, not intractable, without status epilepticus: Secondary | ICD-10-CM

## 2016-11-28 NOTE — Telephone Encounter (Signed)
Patient's mother Remo Lipps advised.

## 2016-11-28 NOTE — Telephone Encounter (Signed)
Prefer getting blood levels in the morning before taking medications so we know lowest blood levels each day. Blood test order placed in chart as a future order to be printed out when ready.

## 2016-11-29 ENCOUNTER — Other Ambulatory Visit: Payer: Self-pay

## 2016-11-29 DIAGNOSIS — G40909 Epilepsy, unspecified, not intractable, without status epilepticus: Secondary | ICD-10-CM

## 2016-11-30 LAB — PHENOBARBITAL LEVEL: PHENOBARBITAL, SERUM: 26 ug/mL (ref 15–40)

## 2016-12-03 ENCOUNTER — Telehealth: Payer: Self-pay

## 2016-12-03 NOTE — Telephone Encounter (Signed)
-----   Message from Margo Common, [place] sent at 12/02/2016 10:15 AM EDT ----- Good therapeutic range on blood test. Continue present dosage and be sure neurologist gets a copy of this report.

## 2016-12-03 NOTE — Telephone Encounter (Signed)
Patient's mother Remo Lipps advised. Lab results faxed to Dr. Melrose Nakayama at [phone]

## 2016-12-17 ENCOUNTER — Telehealth: Payer: Self-pay | Admitting: Family Medicine

## 2016-12-17 NOTE — Telephone Encounter (Signed)
Called Pt to schedule AWV with [facility] - knb °

## 2016-12-19 ENCOUNTER — Ambulatory Visit (INDEPENDENT_AMBULATORY_CARE_PROVIDER_SITE_OTHER): Payer: Medicare Other | Admitting: Family Medicine

## 2016-12-19 ENCOUNTER — Encounter: Payer: Self-pay | Admitting: Family Medicine

## 2016-12-19 VITALS — BP 102/64 | HR 86 | Temp 97.6°F | Ht 65.0 in | Wt 170.4 lb

## 2016-12-19 DIAGNOSIS — Z0000 Encounter for general adult medical examination without abnormal findings: Secondary | ICD-10-CM

## 2016-12-19 DIAGNOSIS — E782 Mixed hyperlipidemia: Secondary | ICD-10-CM | POA: Diagnosis not present

## 2016-12-19 DIAGNOSIS — Z114 Encounter for screening for human immunodeficiency virus [HIV]: Secondary | ICD-10-CM

## 2016-12-19 DIAGNOSIS — F79 Unspecified intellectual disabilities: Secondary | ICD-10-CM | POA: Diagnosis not present

## 2016-12-19 DIAGNOSIS — G40309 Generalized idiopathic epilepsy and epileptic syndromes, not intractable, without status epilepticus: Secondary | ICD-10-CM | POA: Diagnosis not present

## 2016-12-19 NOTE — Progress Notes (Signed)
Patient: Wendy Green, Female    DOB: 1975/10/16, 41 y.o.   MRN: [id number] Visit Date: 12/19/2016  Today's Provider: Vernie Murders, PA   Chief Complaint  Patient presents with  . Medicare Wellness   Subjective:    Annual wellness visit Wendy Green is a 41 y.o. female who presents today for her Subsequent Annual Wellness Visit. She feels fairly well. She reports exercising playing ball 2 times per week. She reports she is sleeping well.  ----------------------------------------------------------- Patient Active Problem List   Diagnosis Date Noted  . Hematuria 06/04/2016  . Headache, migraine 09/11/2015  . Episodic mood disorder ([place]) 09/11/2015  . Seizure ([place]) 09/11/2015  . Intellectual disability 02/24/2015  . Changeable mood ([place]) 02/24/2015  . Cerebral seizure 02/24/2015  . Abnormal weight gain 02/24/2015  . Epilepsy ([place]) 05/16/2009  . HLD (hyperlipidemia) 05/16/2009   Past Medical History:  Diagnosis Date  . Abnormal weight gain 02/24/2015  . Cerebral seizure 02/24/2015   dx at age 62.   . Changeable mood (Wendy Green) 02/24/2015  . Epilepsy ([place]) 05/16/2009  . Episodic mood disorder ([place]) 09/11/2015  . Family history of adverse reaction to anesthesia    mother has nausea and vomiting.  Marland Kitchen Headache, migraine 09/11/2015  . Hematuria 06/04/2016  . History of kidney stones   . HLD (hyperlipidemia) 05/16/2009  . Intellectual disability 02/24/2015  . Seizure ([place]) 09/11/2015   Past Surgical History:  Procedure Laterality Date  . CYSTOSCOPY W/ RETROGRADES Bilateral 07/16/2016   Procedure: CYSTOSCOPY WITH RETROGRADE PYELOGRAM;  Surgeon: Hollice Espy, MD;  Location: [facility];  Service: Urology;  Laterality: Bilateral;  . NO PAST SURGERIES     Family History  Problem Relation Age of Onset  . Heart attack Father   . Stroke Maternal Grandmother   . Pneumonia Maternal Grandfather   . Cancer Paternal Grandfather   . Prostate cancer Neg Hx   . Kidney cancer Neg Hx     Allergies  Allergen Reactions  . Aspirin     Review of Systems  Constitutional: Negative.   HENT: Positive for sinus pressure and sneezing.   Eyes: Positive for itching.  Respiratory: Negative.   Cardiovascular: Negative.   Gastrointestinal: Negative.   Endocrine: Negative.   Genitourinary: Negative.   Musculoskeletal: Positive for back pain.  Skin: Positive for rash.  Allergic/Immunologic: Negative.   Neurological: Positive for headaches.  Hematological: Negative.   Psychiatric/Behavioral: Negative.     Social History   Social History  . Marital status: Single    Spouse name: N/A  . Number of children: N/A  . Years of education: N/A   Occupational History  . Not on file.   Social History Main Topics  . Smoking status: Never Smoker  . Smokeless tobacco: Never Used  . Alcohol use No  . Drug use: No  . Sexual activity: Not Currently    Birth control/ protection: None   Other Topics Concern  . Not on file   Social History Narrative  . No narrative on file    Patient Active Problem List   Diagnosis Date Noted  . Hematuria 06/04/2016  . Headache, migraine 09/11/2015  . Episodic mood disorder (Wendy Green) 09/11/2015  . Seizure ([name]) 09/11/2015  . Intellectual disability 02/24/2015  . Changeable mood (Wendy Green) 02/24/2015  . Cerebral seizure 02/24/2015  . Abnormal weight gain 02/24/2015  . Epilepsy (Wendy Green) 05/16/2009  . HLD (hyperlipidemia) 05/16/2009    Past Surgical History:  Procedure Laterality Date  . CYSTOSCOPY W/ RETROGRADES Bilateral 07/16/2016  Procedure: CYSTOSCOPY WITH RETROGRADE PYELOGRAM;  Surgeon: Hollice Espy, MD;  Location: [facility];  Service: Urology;  Laterality: Bilateral;  . NO PAST SURGERIES      Her family history includes Cancer in her paternal grandfather; Heart attack in her father; Pneumonia in her maternal grandfather; Stroke in her maternal grandmother.     Previous Medications   ACETAZOLAMIDE (DIAMOX) 250 MG TABLET    TAKE 1  TABLET BY MOUTH 3 TIMES A DAY   FLUTICASONE (FLONASE) 50 MCG/ACT NASAL SPRAY    Place into both nostrils daily.   FOLIC ACID (FOLVITE) 1 MG TABLET    TAKE 1 TABLET BY MOUTH ONCE DAILY   KRILL OIL PO    Take 1 capsule by mouth 2 (two) times daily.    MULTIPLE VITAMIN PO    1 tablet daily.   NORETHINDRONE (MICRONOR,CAMILA,ERRIN) 0.35 MG TABLET    Take 1 tablet (0.35 mg total) by mouth daily.   OXCARBAZEPINE (TRILEPTAL) 150 MG TABLET    Take 1 tablet by mouth 2 (two) times daily.   PRIMIDONE (MYSOLINE) 250 MG TABLET    TAKE 1 TABLET BY MOUTH THREE TIMES DAILY AND 1/2 TABLET IN THE EVENING   PSYLLIUM 500 MG CAPS    1 capsule 2 (two) times daily. METAMUCIL, 48.57% (Oral Powder)  As Directed for 0 days  Quantity: 0.00;  Refills: 0   Ordered :31-Aug-2010  Wendy Green Active    Patient Care Team: Margo Common, PA as PCP - General (Physician Assistant)      Objective:   Vitals: BP 102/64 (BP Location: Right Arm, Patient Position: Sitting, Cuff Size: Normal)   Pulse 86   Temp 97.6 F (36.4 C) (Oral)   Ht 5\' 5"  (1.651 m)   Wt 170 lb 6.4 oz (77.3 kg)   SpO2 95%   BMI 28.36 kg/m  Wt Readings from Last 3 Encounters:  12/19/16 170 lb 6.4 oz (77.3 kg)  10/22/16 171 lb 8 oz (77.8 kg)  10/17/16 170 lb (77.1 kg)    Physical Exam  Constitutional: She is oriented to person, place, and time. She appears well-developed and well-nourished.  HENT:  Head: Normocephalic and atraumatic.  Right Ear: External ear normal.  Left Ear: External ear normal.  Nose: Nose normal.  Mouth/Throat: Oropharynx is clear and moist.  Eyes: Conjunctivae and EOM are normal. Pupils are equal, round, and reactive to light. Right eye exhibits no discharge.  Neck: Normal range of motion. Neck supple. No tracheal deviation present. No thyromegaly present.  Cardiovascular: Normal rate, regular rhythm, normal heart sounds and intact distal pulses.   No murmur heard. Pulmonary/Chest:  Effort normal and breath sounds normal. No respiratory distress. She has no wheezes. She has no rales. She exhibits no tenderness.  Abdominal: Soft. She exhibits no distension and no mass. There is no tenderness. There is no rebound and no guarding.  Musculoskeletal: Normal range of motion. She exhibits no edema or tenderness.  Lymphadenopathy:    She has no cervical adenopathy.  Neurological: She is alert and oriented to person, place, and time. She has normal reflexes. No cranial nerve deficit. She exhibits normal muscle tone. Coordination normal.  Skin: Skin is warm and dry. No rash noted. No erythema.  Psychiatric: She has a normal mood and affect. Her behavior is normal. Judgment and thought content normal.    Activities of Daily Living In your present state of health, do you have any difficulty performing the following activities:  12/19/2016 07/09/2016  Hearing? N N  Vision? Y N  Difficulty concentrating or making decisions? Y N  Walking or climbing stairs? Y Y  Dressing or bathing? Y Y  Doing errands, shopping? Tempie Donning  Some recent data might be hidden    Fall Risk Assessment Fall Risk  12/19/2016  Falls in the past year? No     Depression Screen PHQ 2/9 Scores 12/19/2016  PHQ - 2 Score 0  PHQ- 9 Score 0    Cognitive Testing - 6-CIT  Correct? Score   What year is it? no 4 0 or 4  What month is it? no 3 0 or 3  Memorize:    Pia Mau,  42,  [place],      What time is it? (within 1 hour) no 3 0 or 3  Count backwards from 20 no 4 0, 2, or 4  Name the months of the year no 4 0, 2, or 4  Repeat name & address above no 9 0, 2, 4, 6, 8, or 10       TOTAL SCORE  27/28   Interpretation:  Abnormal- Due to mental retardation  Normal (0-7) Abnormal (8-28)   Audit-C Alcohol Use Screening  Question Answer Points  How often do you have alcoholic drink? never 0  On days you do drink alcohol, how many drinks do you typically consume? 0 0  How oftey will you drink 6 or  more in a total? never 0  Total Score:  0   A score of 3 or more in women, and 4 or more in men indicates increased risk for alcohol abuse, EXCEPT if all of the points are from question 1.      Assessment & Plan:     Annual Wellness Visit  Reviewed patient's Family Medical History Reviewed and updated list of patient's medical providers Assessment of cognitive impairment was done Assessed patient's functional ability Established a written schedule for health screening [place] Completed and Reviewed  Exercise Activities and Dietary recommendations Goals    None      Immunization History  Administered Date(s) Administered  . Influenza,inj,Quad PF,36+ Mos 06/06/2016  . Tdap 07/22/2011    Health Maintenance  Topic Date Due  . HIV Screening  05/19/1991  . PAP SMEAR  08/09/2019  . TETANUS/TDAP  07/21/2021  . INFLUENZA VACCINE  Completed     Discussed health benefits of physical activity, and encouraged her to engage in regular exercise appropriate for her age and condition.    ------------------------------------------------------------------------------------------------------------ 1. Annual physical exam General health stable. Immunizations up to date. Will check labs and continue annual follow up with Dr. Marcelline Mates (GYN) for PAP, mammogram and consider BMD test.  - CBC with Differential/Platelet - Comprehensive metabolic panel - Lipid panel - TSH  2. Mixed hyperlipidemia Using Benefiber daily and trying to control diet. Will check labs and encouraged to exercise. - CBC with Differential/Platelet - Comprehensive metabolic panel - Lipid panel - TSH  3. Nonintractable generalized idiopathic epilepsy without status epilepticus ([place]) Epilepsy diagnosed at age 16. Followed by Dr. Melrose Nakayama (neurologist). Presently controlled on Primidone 250 mg TID and 1/2 tablet at bedtime with Trileptal 100 mg BID. Last phenobarbital level was 26 on 11-29-16.  Recheck routine labs and follow up with Dr. Melrose Nakayama routinely as planned. - CBC with Differential/Platelet - Comprehensive metabolic panel - TSH  4. Intellectual disability History of congenital mental retardation. Lives with mother. Enjoys bowling. Some occasional  mood changes but most often quiet and reserved.  5. Screening for HIV (human immunodeficiency virus) - HIV antibody

## 2016-12-23 DIAGNOSIS — Z114 Encounter for screening for human immunodeficiency virus [HIV]: Secondary | ICD-10-CM | POA: Diagnosis not present

## 2016-12-23 DIAGNOSIS — E782 Mixed hyperlipidemia: Secondary | ICD-10-CM | POA: Diagnosis not present

## 2016-12-23 DIAGNOSIS — Z0000 Encounter for general adult medical examination without abnormal findings: Secondary | ICD-10-CM | POA: Diagnosis not present

## 2016-12-23 DIAGNOSIS — G40309 Generalized idiopathic epilepsy and epileptic syndromes, not intractable, without status epilepticus: Secondary | ICD-10-CM | POA: Diagnosis not present

## 2016-12-24 ENCOUNTER — Telehealth: Payer: Self-pay

## 2016-12-24 DIAGNOSIS — E782 Mixed hyperlipidemia: Secondary | ICD-10-CM

## 2016-12-24 LAB — COMPREHENSIVE METABOLIC PANEL
A/G RATIO: 1.5 (ref 1.2–2.2)
ALBUMIN: 4.1 g/dL (ref 3.5–5.5)
ALT: 21 IU/L (ref 0–32)
AST: 17 IU/L (ref 0–40)
Alkaline Phosphatase: 75 IU/L (ref 39–117)
BILIRUBIN TOTAL: 0.2 mg/dL (ref 0.0–1.2)
BUN / CREAT RATIO: 17 (ref 9–23)
BUN: 10 mg/dL (ref 6–24)
CALCIUM: 9.2 mg/dL (ref 8.7–10.2)
CHLORIDE: 107 mmol/L — AB (ref 96–106)
CO2: 19 mmol/L (ref 18–29)
Creatinine, Ser: 0.58 mg/dL (ref 0.57–1.00)
GFR, EST AFRICAN AMERICAN: 133 mL/min/{1.73_m2} (ref >59)
GFR, EST NON AFRICAN AMERICAN: 116 mL/min/{1.73_m2} (ref >59)
GLUCOSE: 82 mg/dL (ref 65–99)
Globulin, Total: 2.7 g/dL (ref 1.5–4.5)
Potassium: 4.1 mmol/L (ref 3.5–5.2)
Sodium: 142 mmol/L (ref 134–144)
Total Protein: 6.8 g/dL (ref 6.0–8.5)

## 2016-12-24 LAB — CBC WITH DIFFERENTIAL/PLATELET
BASOS ABS: 0.1 10*3/uL (ref 0.0–0.2)
BASOS: 1 %
EOS (ABSOLUTE): 0.7 10*3/uL — AB (ref 0.0–0.4)
Eos: 10 %
Hematocrit: 42.4 % (ref 34.0–46.6)
Hemoglobin: 14.3 g/dL (ref 11.1–15.9)
IMMATURE GRANS (ABS): 0 10*3/uL (ref 0.0–0.1)
IMMATURE GRANULOCYTES: 0 %
Lymphocytes Absolute: 1.8 10*3/uL (ref 0.7–3.1)
Lymphs: 27 %
MCH: 33.3 pg — ABNORMAL HIGH (ref 26.6–33.0)
MCHC: 33.7 g/dL (ref 31.5–35.7)
MCV: 99 fL — ABNORMAL HIGH (ref 79–97)
Monocytes Absolute: 0.5 10*3/uL (ref 0.1–0.9)
Monocytes: 7 %
NEUTROS PCT: 55 %
Neutrophils Absolute: 3.5 10*3/uL (ref 1.4–7.0)
Platelets: 251 10*3/uL (ref 150–379)
RBC: 4.3 x10E6/uL (ref 3.77–5.28)
RDW: 13.7 % (ref 12.3–15.4)
WBC: 6.5 10*3/uL (ref 3.4–10.8)

## 2016-12-24 LAB — HIV ANTIBODY (ROUTINE TESTING W REFLEX): HIV SCREEN 4TH GENERATION: NONREACTIVE

## 2016-12-24 LAB — LIPID PANEL
CHOLESTEROL TOTAL: 231 mg/dL — AB (ref 100–199)
Chol/HDL Ratio: 3.9 ratio (ref 0.0–4.4)
HDL: 59 mg/dL (ref >39)
LDL CALC: 138 mg/dL — AB (ref 0–99)
Triglycerides: 172 mg/dL — ABNORMAL HIGH (ref 0–149)
VLDL CHOLESTEROL CAL: 34 mg/dL (ref 5–40)

## 2016-12-24 LAB — TSH: TSH: 3.2 u[IU]/mL (ref 0.450–4.500)

## 2016-12-24 MED ORDER — SIMVASTATIN 20 MG PO TABS: 20.0000 mg | ORAL_TABLET | Freq: Every day | ORAL | 3 refills | Status: DC

## 2016-12-24 NOTE — Telephone Encounter (Signed)
-----   Message from Margo Common, [place] sent at 12/24/2016  8:35 AM EDT ----- All blood tests essentially normal except cholesterol and triglycerides higher than last check. If taking Krill Oil and Metamucil BID, it does not seem to be enough and needs Simvastatin 20 mg qd #30 & 3RF. If not taking Krill Oil and Metamucil, then restart with low fat diet and walking 30 minutes 4-5 days a week. Recheck cholesterol in 3 months to assess progress and need for further adjustments.

## 2016-12-24 NOTE — Telephone Encounter (Signed)
Patients mom Remo Lipps advised and states that patient has been taking Krill Oil and Metamucil twice a day. She agrees to start Simvastatin. Prescription sent into pharmacy. Follow up appointment scheduled 03/18/2017.

## 2016-12-24 NOTE — Telephone Encounter (Signed)
LMTCB

## 2017-01-21 ENCOUNTER — Encounter: Payer: Self-pay | Admitting: Obstetrics and Gynecology

## 2017-01-21 ENCOUNTER — Ambulatory Visit (INDEPENDENT_AMBULATORY_CARE_PROVIDER_SITE_OTHER): Payer: Medicare Other | Admitting: Obstetrics and Gynecology

## 2017-01-21 VITALS — BP 111/73 | HR 82 | Ht 65.0 in | Wt 167.8 lb

## 2017-01-21 DIAGNOSIS — Z1231 Encounter for screening mammogram for malignant neoplasm of breast: Secondary | ICD-10-CM

## 2017-01-21 DIAGNOSIS — N938 Other specified abnormal uterine and vaginal bleeding: Secondary | ICD-10-CM | POA: Diagnosis not present

## 2017-01-21 DIAGNOSIS — Z1239 Encounter for other screening for malignant neoplasm of breast: Secondary | ICD-10-CM

## 2017-01-21 NOTE — Progress Notes (Signed)
    GYNECOLOGY PROGRESS NOTE  Subjective:    Patient ID: Wendy Green, female    DOB: [birth year], 41 y.o.   MRN: [id number]  HPI  Patient is a 41 y.o. G0P0000 female who presents for f/u of medications for dysfunctional uterine bleeding.  Patient was started on progesterone OCPs.  Patient notes that her periods have gotten lighter and shorter.  Also notes that the associated cramping has resolved and periods are no longer painful.  Does note that she still had 2 periods in March, but cycle was lighter the second time. Both cycles lasted 3-5 days. Has only had 1 cycle in April.   Patient's mother wonders if patient should begin having mammograms.   The following portions of the patient's history were reviewed and updated as appropriate: allergies, current medications, past family history, past medical history, past social history, past surgical history and problem list.  Review of Systems Pertinent items noted in HPI and remainder of comprehensive ROS otherwise negative.   Objective:   Blood pressure 111/73, pulse 82, height 5\' 5"  (1.651 m), weight 167 lb 12.8 oz (76.1 kg), last menstrual period 01/08/2017. General appearance: alert and no distress Exam deferred.   Assessment:   DUB Breast cancer screening  Plan:   - Will continue progesterone-only pills as her cycles seem to be better regulated.   - Mammogram ordered today.   - RTC in January for annual exam.    Rubie Maid, MD [facility]

## 2017-02-12 ENCOUNTER — Ambulatory Visit
Admission: RE | Admit: 2017-02-12 | Discharge: 2017-02-12 | Disposition: A | Discharge status: Home / Self Care | Payer: Medicare Other | Source: Ambulatory Visit | Attending: Obstetrics and Gynecology | Admitting: Obstetrics and Gynecology

## 2017-02-12 DIAGNOSIS — Z1231 Encounter for screening mammogram for malignant neoplasm of breast: Secondary | ICD-10-CM | POA: Insufficient documentation

## 2017-02-12 DIAGNOSIS — N6489 Other specified disorders of breast: Secondary | ICD-10-CM | POA: Insufficient documentation

## 2017-02-12 DIAGNOSIS — R928 Other abnormal and inconclusive findings on diagnostic imaging of breast: Secondary | ICD-10-CM | POA: Insufficient documentation

## 2017-02-12 DIAGNOSIS — Z1239 Encounter for other screening for malignant neoplasm of breast: Secondary | ICD-10-CM

## 2017-02-13 ENCOUNTER — Other Ambulatory Visit: Payer: Self-pay | Admitting: Obstetrics and Gynecology

## 2017-02-13 DIAGNOSIS — N6489 Other specified disorders of breast: Secondary | ICD-10-CM

## 2017-02-13 DIAGNOSIS — R928 Other abnormal and inconclusive findings on diagnostic imaging of breast: Secondary | ICD-10-CM

## 2017-02-25 ENCOUNTER — Ambulatory Visit
Admission: RE | Admit: 2017-02-25 | Discharge: 2017-02-25 | Disposition: A | Discharge status: Home / Self Care | Payer: Medicare Other | Source: Ambulatory Visit | Attending: Obstetrics and Gynecology | Admitting: Obstetrics and Gynecology

## 2017-02-25 DIAGNOSIS — N6489 Other specified disorders of breast: Secondary | ICD-10-CM | POA: Diagnosis not present

## 2017-02-25 DIAGNOSIS — R928 Other abnormal and inconclusive findings on diagnostic imaging of breast: Secondary | ICD-10-CM

## 2017-03-06 ENCOUNTER — Telehealth: Payer: Self-pay | Admitting: Family Medicine

## 2017-03-06 DIAGNOSIS — E785 Hyperlipidemia, unspecified: Secondary | ICD-10-CM

## 2017-03-06 NOTE — Telephone Encounter (Signed)
Please advise or place orders.  Thanks

## 2017-03-06 NOTE — Telephone Encounter (Signed)
It was checked in March 2018. Only need to recheck cholesterol at the next office visit.

## 2017-03-06 NOTE — Telephone Encounter (Signed)
Pt's mom Wendy Green stated that it was time for pt to have her labs done before her appt on 03/18/17 and wanted to see if she needed to have her levels checked for her seizure medication. Please advise. Thanks TNP

## 2017-03-06 NOTE — Telephone Encounter (Signed)
Advised and order placed

## 2017-03-10 ENCOUNTER — Other Ambulatory Visit: Payer: Self-pay | Admitting: Family Medicine

## 2017-03-10 DIAGNOSIS — E785 Hyperlipidemia, unspecified: Secondary | ICD-10-CM | POA: Diagnosis not present

## 2017-03-11 LAB — LIPID PANEL WITH LDL/HDL RATIO
Cholesterol, Total: 185 mg/dL (ref 100–199)
HDL: 51 mg/dL (ref >39)
LDL Calculated: 100 mg/dL — ABNORMAL HIGH (ref 0–99)
LDL/HDL RATIO: 2 ratio (ref 0.0–3.2)
Triglycerides: 168 mg/dL — ABNORMAL HIGH (ref 0–149)
VLDL Cholesterol Cal: 34 mg/dL (ref 5–40)

## 2017-03-11 NOTE — Progress Notes (Signed)
Advised  ED 

## 2017-03-18 ENCOUNTER — Ambulatory Visit (INDEPENDENT_AMBULATORY_CARE_PROVIDER_SITE_OTHER): Payer: Medicare Other | Admitting: Family Medicine

## 2017-03-18 ENCOUNTER — Encounter: Payer: Self-pay | Admitting: Family Medicine

## 2017-03-18 DIAGNOSIS — E782 Mixed hyperlipidemia: Secondary | ICD-10-CM | POA: Diagnosis not present

## 2017-03-18 MED ORDER — SIMVASTATIN 20 MG PO TABS: 20.0000 mg | ORAL_TABLET | Freq: Every day | ORAL | 3 refills | Status: DC

## 2017-03-18 NOTE — Progress Notes (Signed)
Patient: Wendy Green Female    DOB: 10-25-1975   41 y.o.   MRN: [id number] Visit Date: 03/18/2017  Today's Provider: Vernie Murders, PA   Chief Complaint  Patient presents with  . Hyperlipidemia  . Follow-up   Subjective:    HPI  Lipid/Cholesterol, Follow-up:   Last seen for this 3 months ago.  Management changes since that visit include started Simvastatin 20 mg on 12/24/2016. Recommended to continue Krill oil and Metamucil, walk 30 mins 4-5 days per week,and follow a low fat diet. . Last Lipid Panel:    Component Value Date/Time   CHOL 185 03/10/2017 0849   TRIG 168 (H) 03/10/2017 0849   HDL 51 03/10/2017 0849   CHOLHDL 3.9 12/23/2016 1010   LDLCALC 100 (H) 03/10/2017 0849    Risk factors for vascular disease include hypercholesterolemia  She reports fair compliance with treatment. Patient is not able to walk 4-5 days per week at home due to excessive ticks in yard. She stopped taking Metamucil and is now taking Benefiber. She is not having side effects.  Current symptoms include none Weight trend: stable Current diet: well balanced Current exercise: walking some  Wt Readings from Last 3 Encounters:  03/18/17 171 lb (77.6 kg)  01/21/17 167 lb 12.8 oz (76.1 kg)  12/19/16 170 lb 6.4 oz (77.3 kg)    ------------------------------------------------------------------- Patient Active Problem List   Diagnosis Date Noted  . Hematuria 06/04/2016  . Headache, migraine 09/11/2015  . Episodic mood disorder ([place]) 09/11/2015  . Seizure ([place]) 09/11/2015  . Intellectual disability 02/24/2015  . Changeable mood ([place]) 02/24/2015  . Cerebral seizure 02/24/2015  . Abnormal weight gain 02/24/2015  . Epilepsy ([place]) 05/16/2009  . HLD (hyperlipidemia) 05/16/2009   Past Surgical History:  Procedure Laterality Date  . CYSTOSCOPY W/ RETROGRADES Bilateral 07/16/2016   Procedure: CYSTOSCOPY WITH RETROGRADE PYELOGRAM;  Surgeon: Hollice Espy, MD;  Location: [facility] ORS;   Service: Urology;  Laterality: Bilateral;  . NO PAST SURGERIES     Family History  Problem Relation Age of Onset  . Heart attack Father   . Stroke Maternal Grandmother   . Pneumonia Maternal Grandfather   . Cancer Paternal Grandfather   . Prostate cancer Neg Hx   . Kidney cancer Neg Hx    Allergies  Allergen Reactions  . Aspirin      Previous Medications   ACETAZOLAMIDE (DIAMOX) 250 MG TABLET    TAKE 1 TABLET BY MOUTH 3 TIMES A DAY   FLUTICASONE (FLONASE) 50 MCG/ACT NASAL SPRAY    Place into both nostrils daily.   FOLIC ACID (FOLVITE) 1 MG TABLET    TAKE 1 TABLET BY MOUTH ONCE DAILY   KRILL OIL PO    Take 1 capsule by mouth 2 (two) times daily.    MULTIPLE VITAMIN PO    1 tablet daily.   NORETHINDRONE (MICRONOR,CAMILA,ERRIN) 0.35 MG TABLET    Take 1 tablet (0.35 mg total) by mouth daily.   OXCARBAZEPINE (TRILEPTAL) 150 MG TABLET    Take 1 tablet by mouth 2 (two) times daily.   PRIMIDONE (MYSOLINE) 250 MG TABLET    TAKE 1 TABLET BY MOUTH THREE TIMES DAILY AND 1/2 TABLET IN THE EVENING   SIMVASTATIN (ZOCOR) 20 MG TABLET    Take 1 tablet (20 mg total) by mouth at bedtime.   WHEAT DEXTRIN (BENEFIBER PO)    Take by mouth.    Review of Systems  Constitutional: Negative.   Respiratory: Negative.   Cardiovascular: Negative.  Social History  Substance Use Topics  . Smoking status: Never Smoker  . Smokeless tobacco: Never Used  . Alcohol use No   Objective:   BP 102/66 (BP Location: Right Arm, Patient Position: Sitting, Cuff Size: Normal)   Pulse 74   Temp 98.3 F (36.8 C) (Oral)   Wt 171 lb (77.6 kg)   SpO2 98%   BMI 28.46 kg/m   Physical Exam  Constitutional: She appears well-developed and well-nourished. No distress.  HENT:  Head: Normocephalic and atraumatic.  Right Ear: Hearing normal.  Left Ear: Hearing normal.  Nose: Nose normal.  Eyes: Conjunctivae and lids are normal. Right eye exhibits no discharge. Left eye exhibits no discharge. No scleral icterus.    Neck: Neck supple.  Cardiovascular: Normal rate and regular rhythm.   Pulmonary/Chest: Effort normal and breath sounds normal. No respiratory distress.  Abdominal: Soft. Bowel sounds are normal.  Musculoskeletal: Normal range of motion.  Skin: Skin is intact. No lesion and no rash noted.  Psychiatric: Her speech is normal.      Assessment & Plan:     1. Mixed hyperlipidemia Tolerating Simvastatin well without side effects. Following diet and exercising in summer camp in July. Continue present dosage and recheck progress in 3 months. Lab Results  Component Value Date   CHOL 185 03/10/2017   HDL 51 03/10/2017   LDLCALC 100 (H) 03/10/2017   TRIG 168 (H) 03/10/2017   CHOLHDL 3.9 12/23/2016    - simvastatin (ZOCOR) 20 MG tablet; Take 1 tablet (20 mg total) by mouth at bedtime.  Dispense: 30 tablet; Refill: 3

## 2017-04-15 ENCOUNTER — Other Ambulatory Visit: Payer: Self-pay | Admitting: Family Medicine

## 2017-04-15 DIAGNOSIS — E782 Mixed hyperlipidemia: Secondary | ICD-10-CM

## 2017-04-19 ENCOUNTER — Other Ambulatory Visit: Payer: Self-pay | Admitting: Family Medicine

## 2017-04-19 DIAGNOSIS — E782 Mixed hyperlipidemia: Secondary | ICD-10-CM

## 2017-06-13 ENCOUNTER — Telehealth: Payer: Self-pay | Admitting: Emergency Medicine

## 2017-06-13 NOTE — Telephone Encounter (Signed)
Mother Mindi Junker, RMA

## 2017-06-13 NOTE — Telephone Encounter (Signed)
This time, prefer to see her first. May not need all the tests run this time.

## 2017-06-13 NOTE — Telephone Encounter (Signed)
Pt mother called wanting to know if pt needed labs prior to her appt . Please advise.  If does not answer may leave a message.

## 2017-06-17 ENCOUNTER — Encounter: Payer: Self-pay | Admitting: Family Medicine

## 2017-06-17 ENCOUNTER — Ambulatory Visit (INDEPENDENT_AMBULATORY_CARE_PROVIDER_SITE_OTHER): Payer: Medicare Other | Admitting: Family Medicine

## 2017-06-17 VITALS — BP 102/68 | HR 73 | Temp 98.3°F | Wt 170.6 lb

## 2017-06-17 DIAGNOSIS — Z23 Encounter for immunization: Secondary | ICD-10-CM | POA: Diagnosis not present

## 2017-06-17 DIAGNOSIS — G40309 Generalized idiopathic epilepsy and epileptic syndromes, not intractable, without status epilepticus: Secondary | ICD-10-CM | POA: Diagnosis not present

## 2017-06-17 DIAGNOSIS — E782 Mixed hyperlipidemia: Secondary | ICD-10-CM

## 2017-06-17 DIAGNOSIS — F79 Unspecified intellectual disabilities: Secondary | ICD-10-CM

## 2017-06-17 NOTE — Progress Notes (Signed)
Patient: Wendy Green Female    DOB: August 12, 1976   41 y.o.   MRN: [id number] Visit Date: 06/17/2017  Today's Provider: Vernie Murders, PA   Chief Complaint  Patient presents with  . Hyperlipidemia  . Follow-up   Subjective:    HPI   Lipid/Cholesterol, Follow-up:   Last seen for this 3 months ago.  Management no changes made since that visit. Patient advised to continue Simvastatin 20 mg, Krill oil and Benifiber, walk 30 mins 4-5 days per week,and follow a low fat diet. .  Last Lipid Panel: Lab Results  Component Value Date   CHOL 185 03/10/2017   HDL 51 03/10/2017   LDLCALC 100 (H) 03/10/2017   TRIG 168 (H) 03/10/2017   CHOLHDL 3.9 12/23/2016    Risk factors for vascular disease include hypercholesterolemia  She reports fair compliance with treatment. She is only working 2-3 days per week and is not following a low fat diet. She is not having side effects.  Current symptoms include none Weight trend: stable Current diet: well balanced Current exercise: walking some Past Medical History:  Diagnosis Date  . Abnormal weight gain 02/24/2015  . Cerebral seizure 02/24/2015   dx at age 76.   . Changeable mood ([place]) 02/24/2015  . Epilepsy ([place]) 05/16/2009  . Episodic mood disorder ([place]) 09/11/2015  . Family history of adverse reaction to anesthesia    mother has nausea and vomiting.  Marland Kitchen Headache, migraine 09/11/2015  . Hematuria 06/04/2016  . History of kidney stones   . HLD (hyperlipidemia) 05/16/2009  . Intellectual disability 02/24/2015  . Seizure ([place]) 09/11/2015   Past Surgical History:  Procedure Laterality Date  . CYSTOSCOPY W/ RETROGRADES Bilateral 07/16/2016   Procedure: CYSTOSCOPY WITH RETROGRADE PYELOGRAM;  Surgeon: Hollice Espy, MD;  Location: [facility] ORS;  Service: Urology;  Laterality: Bilateral;  . NO PAST SURGERIES     Family History  Problem Relation Age of Onset  . Heart attack Father   . Stroke Maternal Grandmother   . Pneumonia Maternal  Grandfather   . Cancer Paternal Grandfather   . Prostate cancer Neg Hx   . Kidney cancer Neg Hx    Allergies  Allergen Reactions  . Aspirin      Previous Medications   ACETAZOLAMIDE (DIAMOX) 250 MG TABLET    TAKE 1 TABLET BY MOUTH 3 TIMES A DAY   FLUTICASONE (FLONASE) 50 MCG/ACT NASAL SPRAY    Place into both nostrils daily.   FOLIC ACID (FOLVITE) 1 MG TABLET    TAKE 1 TABLET BY MOUTH ONCE DAILY   KRILL OIL PO    Take 1 capsule by mouth 2 (two) times daily.    MULTIPLE VITAMIN PO    1 tablet daily.   NORETHINDRONE (MICRONOR,CAMILA,ERRIN) 0.35 MG TABLET    Take 1 tablet (0.35 mg total) by mouth daily.   OXCARBAZEPINE (TRILEPTAL) 150 MG TABLET    Take 1 tablet by mouth 2 (two) times daily.   PRIMIDONE (MYSOLINE) 250 MG TABLET    TAKE 1 TABLET BY MOUTH THREE TIMES DAILY AND 1/2 TABLET IN THE EVENING   SIMVASTATIN (ZOCOR) 20 MG TABLET    TAKE 1 TABLET (20 MG TOTAL) BY MOUTH AT BEDTIME.   WHEAT DEXTRIN (BENEFIBER PO)    Take by mouth.    Review of Systems  Constitutional: Negative.   Respiratory: Negative.   Cardiovascular: Negative.     Social History  Substance Use Topics  . Smoking status: Never Smoker  . Smokeless tobacco: Never  Used  . Alcohol use No   Objective:   BP 102/68 (BP Location: Right Arm, Patient Position: Sitting, Cuff Size: Normal)   Pulse 73   Temp 98.3 F (36.8 C) (Oral)   Wt 170 lb 9.6 oz (77.4 kg)   SpO2 98%   BMI 28.39 kg/m  Wt Readings from Last 3 Encounters:  06/17/17 170 lb 9.6 oz (77.4 kg)  03/18/17 171 lb (77.6 kg)  01/21/17 167 lb 12.8 oz (76.1 kg)    Physical Exam  Constitutional: She appears well-developed and well-nourished. No distress.  HENT:  Head: Normocephalic and atraumatic.  Right Ear: Hearing normal.  Left Ear: Hearing normal.  Nose: Nose normal.  Eyes: Conjunctivae and lids are normal. Right eye exhibits no discharge. Left eye exhibits no discharge. No scleral icterus.  Neck: Neck supple.  Cardiovascular: Normal rate and  regular rhythm.   Pulmonary/Chest: Effort normal and breath sounds normal. No respiratory distress.  Abdominal: Soft. Bowel sounds are normal.  Musculoskeletal: Normal range of motion.  Neurological: She is alert.  Skin: Skin is intact. No lesion and no rash noted.  Psychiatric: Her speech is normal.      Assessment & Plan:      1. Mixed hyperlipidemia Tolerating Krill Oil and Simvastatin 20 mg qd. Using Benefiber daily in coffee. Will check CMP, Lipid Panel and CBC. Continue exercise 3 days a week. Recheck pending lab reports. - Comprehensive metabolic panel - Lipid panel - CBC with Differential/Platelet  2. Intellectual disability Unchanged. Lives with mother as her caregiver. Participates in [employer] - bowling.  3. Nonintractable generalized idiopathic epilepsy without status epilepticus ([place]) Stable with no seizure activity in 2 years. Followed by Dr. Melrose Nakayama (neurologist) annually. Continues Primidone, Diamox and Trileptal. Recheck labs and continue follow up with Dr. Melrose Nakayama. - Lipid panel - CBC with Differential/Platelet  4. Need for influenza vaccination - Flu Vaccine QUAD 6+ mos PF IM (Fluarix Quad PF)

## 2017-06-19 DIAGNOSIS — E782 Mixed hyperlipidemia: Secondary | ICD-10-CM | POA: Diagnosis not present

## 2017-06-19 DIAGNOSIS — G40309 Generalized idiopathic epilepsy and epileptic syndromes, not intractable, without status epilepticus: Secondary | ICD-10-CM | POA: Diagnosis not present

## 2017-06-19 LAB — CBC WITH DIFFERENTIAL/PLATELET
BASOS PCT: 0.6 %
Basophils Absolute: 41 cells/uL (ref 0–200)
EOS ABS: 462 {cells}/uL (ref 15–500)
Eosinophils Relative: 6.8 %
HEMATOCRIT: 40.9 % (ref 35.0–45.0)
Hemoglobin: 13.9 g/dL (ref 11.7–15.5)
LYMPHS ABS: 1380 {cells}/uL (ref 850–3900)
MCH: 32.6 pg (ref 27.0–33.0)
MCHC: 34 g/dL (ref 32.0–36.0)
MCV: 96 fL (ref 80.0–100.0)
MPV: 10.9 fL (ref 7.5–12.5)
Monocytes Relative: 7.8 %
Neutro Abs: 4386 cells/uL (ref 1500–7800)
Neutrophils Relative %: 64.5 %
Platelets: 227 10*3/uL (ref 140–400)
RBC: 4.26 10*6/uL (ref 3.80–5.10)
RDW: 12.7 % (ref 11.0–15.0)
Total Lymphocyte: 20.3 %
WBC: 6.8 10*3/uL (ref 3.8–10.8)
WBCMIX: 530 {cells}/uL (ref 200–950)

## 2017-06-19 LAB — COMPREHENSIVE METABOLIC PANEL
AG Ratio: 1.3 (calc) (ref 1.0–2.5)
ALBUMIN MSPROF: 3.9 g/dL (ref 3.6–5.1)
ALKALINE PHOSPHATASE (APISO): 69 U/L (ref 33–115)
ALT: 15 U/L (ref 6–29)
AST: 14 U/L (ref 10–30)
BILIRUBIN TOTAL: 0.2 mg/dL (ref 0.2–1.2)
BUN: 10 mg/dL (ref 7–25)
CALCIUM: 8.9 mg/dL (ref 8.6–10.2)
CO2: 19 mmol/L — AB (ref 20–32)
Chloride: 107 mmol/L (ref 98–110)
Creat: 0.52 mg/dL (ref 0.50–1.10)
GLOBULIN: 3.1 g/dL (ref 1.9–3.7)
Glucose, Bld: 93 mg/dL (ref 65–99)
Potassium: 4 mmol/L (ref 3.5–5.3)
Sodium: 136 mmol/L (ref 135–146)
Total Protein: 7 g/dL (ref 6.1–8.1)

## 2017-06-19 LAB — LIPID PANEL
CHOLESTEROL: 197 mg/dL (ref <200)
HDL: 54 mg/dL (ref >50)
LDL Cholesterol (Calc): 115 mg/dL (calc) — ABNORMAL HIGH
Non-HDL Cholesterol (Calc): 143 mg/dL (calc) — ABNORMAL HIGH (ref <130)
TRIGLYCERIDES: 168 mg/dL — AB (ref <150)
Total CHOL/HDL Ratio: 3.6 (calc) (ref <5.0)

## 2017-06-20 ENCOUNTER — Telehealth: Payer: Self-pay

## 2017-06-20 NOTE — Telephone Encounter (Signed)
Mother was advised. KW

## 2017-06-20 NOTE — Telephone Encounter (Signed)
Left message for Wendy Green patient's mother to return call.

## 2017-06-20 NOTE — Telephone Encounter (Signed)
-----   Message from Margo Common, [place] sent at 06/20/2017 11:38 AM EDT ----- Blood chemistry normal but triglycerides and LDL cholesterol still elevated some. Continue Simvastatin 20 mg qd and recheck progress in 3 months.

## 2017-07-23 DIAGNOSIS — G40309 Generalized idiopathic epilepsy and epileptic syndromes, not intractable, without status epilepticus: Secondary | ICD-10-CM | POA: Diagnosis not present

## 2017-08-28 DIAGNOSIS — R569 Unspecified convulsions: Secondary | ICD-10-CM | POA: Diagnosis not present

## 2017-08-29 ENCOUNTER — Encounter: Payer: Self-pay | Admitting: Family Medicine

## 2017-08-29 ENCOUNTER — Ambulatory Visit (INDEPENDENT_AMBULATORY_CARE_PROVIDER_SITE_OTHER): Payer: Medicare Other | Admitting: Family Medicine

## 2017-08-29 VITALS — BP 118/80 | HR 68 | Temp 97.9°F | Resp 16

## 2017-08-29 DIAGNOSIS — J069 Acute upper respiratory infection, unspecified: Secondary | ICD-10-CM

## 2017-08-29 MED ORDER — AMOXICILLIN 500 MG PO CAPS: 500.0000 mg | ORAL_CAPSULE | Freq: Three times a day (TID) | ORAL | 0 refills | Status: DC

## 2017-08-29 NOTE — Progress Notes (Signed)
Patient: Wendy Green Female    DOB: 04/14/1976   41 y.o.   MRN: [id number] Visit Date: 08/29/2017  Today's Provider: Vernie Murders, PA   Chief Complaint  Patient presents with  . Cough   Subjective:    HPI Pt is here for a cough and congestion. She reports that it started about a week ago. She has cough, sore throat, headache across her forehead. She reports that she was vomiting. Her and her mother believe it was from her mucus. She reports that she was feeling better until she had an EEG yesterday and then she felt worse.   Past Medical History:  Diagnosis Date  . Abnormal weight gain 02/24/2015  . Cerebral seizure 02/24/2015   dx at age 27.   . Changeable mood 02/24/2015  . Epilepsy ([place]) 05/16/2009  . Episodic mood disorder ([place]) 09/11/2015  . Family history of adverse reaction to anesthesia    mother has nausea and vomiting.  Marland Kitchen Headache, migraine 09/11/2015  . Hematuria 06/04/2016  . History of kidney stones   . HLD (hyperlipidemia) 05/16/2009  . Intellectual disability 02/24/2015  . Seizure ([place]) 09/11/2015   Past Surgical History:  Procedure Laterality Date  . CYSTOSCOPY W/ RETROGRADES Bilateral 07/16/2016   Procedure: CYSTOSCOPY WITH RETROGRADE PYELOGRAM;  Surgeon: Hollice Espy, MD;  Location: [facility] ORS;  Service: Urology;  Laterality: Bilateral;  . NO PAST SURGERIES     Family History  Problem Relation Age of Onset  . Heart attack Father   . Stroke Maternal Grandmother   . Pneumonia Maternal Grandfather   . Cancer Paternal Grandfather   . Prostate cancer Neg Hx   . Kidney cancer Neg Hx    Allergies  Allergen Reactions  . Aspirin     Current Outpatient Medications:  .  acetaZOLAMIDE (DIAMOX) 250 MG tablet, TAKE 1 TABLET BY MOUTH 3 TIMES A DAY, Disp: 90 tablet, Rfl: 3 .  fluticasone (FLONASE) 50 MCG/ACT nasal spray, Place into both nostrils daily., Disp: , Rfl:  .  folic acid (FOLVITE) 1 MG tablet, TAKE 1 TABLET BY MOUTH ONCE DAILY, Disp: , Rfl:  .   KRILL OIL PO, Take 1 capsule by mouth 2 (two) times daily. , Disp: , Rfl:  .  MULTIPLE VITAMIN PO, 1 tablet daily., Disp: , Rfl:  .  norethindrone (MICRONOR,CAMILA,ERRIN) 0.35 MG tablet, Take 1 tablet (0.35 mg total) by mouth daily., Disp: 1 Package, Rfl: 11 .  OXcarbazepine (TRILEPTAL) 150 MG tablet, Take 1 tablet by mouth 2 (two) times daily., Disp: , Rfl:  .  primidone (MYSOLINE) 250 MG tablet, TAKE 1 TABLET BY MOUTH THREE TIMES DAILY AND 1/2 TABLET IN THE EVENING, Disp: 100 tablet, Rfl: 3 .  simvastatin (ZOCOR) 20 MG tablet, TAKE 1 TABLET (20 MG TOTAL) BY MOUTH AT BEDTIME., Disp: 30 tablet, Rfl: 3 .  Wheat Dextrin (BENEFIBER PO), Take by mouth., Disp: , Rfl:   Review of Systems  Constitutional: Positive for fatigue.  HENT: Positive for congestion and sinus pain.   Eyes: Negative.   Respiratory: Positive for cough and wheezing.   Cardiovascular: Negative.   Gastrointestinal: Negative.   Endocrine: Negative.   Genitourinary: Negative.   Musculoskeletal: Negative.   Skin: Negative.   Allergic/Immunologic: Negative.   Neurological: Positive for headaches.  Hematological: Negative.   Psychiatric/Behavioral: Negative.    Social History   Tobacco Use  . Smoking status: Never Smoker  . Smokeless tobacco: Never Used  Substance Use Topics  . Alcohol  use: No    Alcohol/week: 0.0 oz   Objective:   BP 118/80 (BP Location: Left Arm, Patient Position: Sitting, Cuff Size: Normal)   Pulse 68   Temp 97.9 F (36.6 C) (Axillary)   Resp 16   SpO2 97%  Vitals:   08/29/17 1124  BP: 118/80  Pulse: 68  Resp: 16  Temp: 97.9 F (36.6 C)  TempSrc: Axillary  SpO2: 97%   Physical Exam  Constitutional: She is oriented to person, place, and time. She appears well-developed and well-nourished. No distress.  HENT:  Head: Normocephalic and atraumatic.  Right Ear: Hearing and external ear normal.  Left Ear: Hearing and external ear normal.  Nose: Nose normal.  Mouth/Throat: Oropharynx is  clear and moist.  Eyes: Conjunctivae and lids are normal. Right eye exhibits no discharge. Left eye exhibits no discharge. No scleral icterus.  Neck: Neck supple.  Cardiovascular: Normal rate and regular rhythm.  Pulmonary/Chest: Effort normal and breath sounds normal. No respiratory distress.  Abdominal: Soft.  Musculoskeletal: Normal range of motion.  Neurological: She is alert and oriented to person, place, and time.  Skin: Skin is intact. No lesion and no rash noted.  Psychiatric: She has a normal mood and affect. Her speech is normal and behavior is normal. Thought content normal.      Assessment & Plan:     1. URI with cough and congestion Onset a week ago. No fever and symptoms improving. Lives with mother and with her epilepsy and mood disorder/mental retardation, concern is for an impending bronchitis. Given Amoxicillin to start over the weekend if fever or purulent congestion begins. Continue Robitussin-DM for cough prn. Recheck if no better in 5-7 days. - amoxicillin (AMOXIL) 500 MG capsule; Take 1 capsule (500 mg total) by mouth 3 (three) times daily.  Dispense: 30 capsule; Refill: [place], [place]  [facility]

## 2017-09-02 ENCOUNTER — Ambulatory Visit: Payer: Self-pay | Admitting: Family Medicine

## 2017-09-11 ENCOUNTER — Other Ambulatory Visit: Payer: Self-pay | Admitting: Obstetrics and Gynecology

## 2017-09-15 ENCOUNTER — Encounter: Payer: Self-pay | Admitting: Family Medicine

## 2017-09-15 ENCOUNTER — Ambulatory Visit (INDEPENDENT_AMBULATORY_CARE_PROVIDER_SITE_OTHER): Payer: Medicare Other | Admitting: Family Medicine

## 2017-09-15 VITALS — BP 104/70 | HR 60 | Temp 98.8°F | Resp 16 | Wt 168.0 lb

## 2017-09-15 DIAGNOSIS — R300 Dysuria: Secondary | ICD-10-CM

## 2017-09-15 MED ORDER — SULFAMETHOXAZOLE-TRIMETHOPRIM 800-160 MG PO TABS: 1.0000 | ORAL_TABLET | Freq: Two times a day (BID) | ORAL | 0 refills | Status: DC

## 2017-09-15 NOTE — Progress Notes (Signed)
Patient: Wendy Green Female    DOB: Jul 01, 1976   41 y.o.   MRN: [id number] Visit Date: 09/15/2017  Today's Provider: Vernie Murders, PA   Chief Complaint  Patient presents with  . Urinary Tract Infection    Started Saturday   Subjective:    Urinary Tract Infection   This is a new problem. The current episode started in the past 7 days. There has been no fever. She is not sexually active. There is no history of pyelonephritis. Associated symptoms include frequency and urgency. Pertinent negatives include no chills, discharge, flank pain, hematuria, hesitancy, nausea, possible pregnancy, sweats or vomiting.   Past Medical History:  Diagnosis Date  . Abnormal weight gain 02/24/2015  . Cerebral seizure 02/24/2015   dx at age 60.   . Changeable mood 02/24/2015  . Epilepsy ([place]) 05/16/2009  . Episodic mood disorder ([place]) 09/11/2015  . Family history of adverse reaction to anesthesia    mother has nausea and vomiting.  Marland Kitchen Headache, migraine 09/11/2015  . Hematuria 06/04/2016  . History of kidney stones   . HLD (hyperlipidemia) 05/16/2009  . Intellectual disability 02/24/2015  . Seizure ([place]) 09/11/2015   Past Surgical History:  Procedure Laterality Date  . CYSTOSCOPY W/ RETROGRADES Bilateral 07/16/2016   Procedure: CYSTOSCOPY WITH RETROGRADE PYELOGRAM;  Surgeon: Hollice Espy, MD;  Location: [facility] ORS;  Service: Urology;  Laterality: Bilateral;  . NO PAST SURGERIES     Family History  Problem Relation Age of Onset  . Heart attack Father   . Stroke Maternal Grandmother   . Pneumonia Maternal Grandfather   . Cancer Paternal Grandfather   . Prostate cancer Neg Hx   . Kidney cancer Neg Hx    Allergies  Allergen Reactions  . Aspirin     Current Outpatient Medications:  .  acetaZOLAMIDE (DIAMOX) 250 MG tablet, TAKE 1 TABLET BY MOUTH 3 TIMES A DAY, Disp: 90 tablet, Rfl: 3 .  amoxicillin (AMOXIL) 500 MG capsule, Take 1 capsule (500 mg total) by mouth 3 (three) times  daily., Disp: 30 capsule, Rfl: 0 .  CAMILA 0.35 MG tablet, TAKE 1 TABLET (0.35 MG TOTAL) BY MOUTH DAILY., Disp: 28 tablet, Rfl: 11 .  fluticasone (FLONASE) 50 MCG/ACT nasal spray, Place into both nostrils daily., Disp: , Rfl:  .  folic acid (FOLVITE) 1 MG tablet, TAKE 1 TABLET BY MOUTH ONCE DAILY, Disp: , Rfl:  .  KRILL OIL PO, Take 1 capsule by mouth 2 (two) times daily. , Disp: , Rfl:  .  MULTIPLE VITAMIN PO, 1 tablet daily., Disp: , Rfl:  .  OXcarbazepine (TRILEPTAL) 150 MG tablet, Take 1 tablet by mouth 2 (two) times daily., Disp: , Rfl:  .  primidone (MYSOLINE) 250 MG tablet, TAKE 1 TABLET BY MOUTH THREE TIMES DAILY AND 1/2 TABLET IN THE EVENING, Disp: 100 tablet, Rfl: 3 .  simvastatin (ZOCOR) 20 MG tablet, TAKE 1 TABLET (20 MG TOTAL) BY MOUTH AT BEDTIME., Disp: 30 tablet, Rfl: 3 .  Wheat Dextrin (BENEFIBER PO), Take by mouth., Disp: , Rfl:   Review of Systems  Constitutional: Negative for activity change, appetite change, chills, diaphoresis, fatigue, fever and unexpected weight change.  Gastrointestinal: Negative.  Negative for nausea and vomiting.  Genitourinary: Positive for frequency and urgency. Negative for decreased urine volume, difficulty urinating, dyspareunia, dysuria, enuresis, flank pain, genital sores, hematuria, hesitancy, menstrual problem, pelvic pain, vaginal bleeding, vaginal discharge and vaginal pain.  Neurological: Negative for dizziness, light-headedness and headaches.  Social History   Tobacco Use  . Smoking status: Never Smoker  . Smokeless tobacco: Never Used  Substance Use Topics  . Alcohol use: No    Alcohol/week: 0.0 oz   Objective:   BP 104/70 (BP Location: Right Arm, Patient Position: Sitting, Cuff Size: Normal)   Pulse 60   Temp 98.8 F (37.1 C) (Oral)   Resp 16   Wt 168 lb (76.2 kg)   BMI 27.96 kg/m  Vitals:   09/15/17 0921  BP: 104/70  Pulse: 60  Resp: 16  Temp: 98.8 F (37.1 C)  TempSrc: Oral  Weight: 168 lb (76.2 kg)      Physical Exam  Constitutional: She appears well-developed and well-nourished. No distress.  HENT:  Head: Normocephalic and atraumatic.  Right Ear: Hearing normal.  Left Ear: Hearing normal.  Nose: Nose normal.  Eyes: Conjunctivae and lids are normal. Right eye exhibits no discharge. Left eye exhibits no discharge. No scleral icterus.  Cardiovascular: Normal rate and regular rhythm.  Pulmonary/Chest: Effort normal. No respiratory distress.  Abdominal: Soft. Bowel sounds are normal. There is no tenderness. There is no rebound and no guarding.  Musculoskeletal: Normal range of motion.  Neurological: She is alert.  Skin: Skin is intact. No lesion and no rash noted.  Psychiatric: Her speech is normal.   Assessment & Plan:     1. Dysuria Frequency the past 3 weeks and developed burning the past 2 days only when urinating. No known vaginal discharge. Has appointment to see GYN 10-01-17. Unable to get urine specimen and does not tolerate GYN exam by this practitioner. Will treat with Bactrim-DS for 3 days and may use AZO-Standard for discomfort. Recheck if no better in 3 days. - sulfamethoxazole-trimethoprim (BACTRIM DS,SEPTRA DS) 800-160 MG tablet; Take 1 tablet by mouth 2 (two) times daily.  Dispense: 6 tablet; Refill: [place], [place]  [facility]

## 2017-10-01 ENCOUNTER — Ambulatory Visit: Payer: Medicare Other | Admitting: Obstetrics and Gynecology

## 2017-10-01 ENCOUNTER — Encounter: Payer: Self-pay | Admitting: Obstetrics and Gynecology

## 2017-10-01 VITALS — BP 105/71 | HR 67 | Ht 65.0 in | Wt 166.0 lb

## 2017-10-01 DIAGNOSIS — Z1239 Encounter for other screening for malignant neoplasm of breast: Secondary | ICD-10-CM

## 2017-10-01 DIAGNOSIS — Z01419 Encounter for gynecological examination (general) (routine) without abnormal findings: Secondary | ICD-10-CM

## 2017-10-01 MED ORDER — MEDROXYPROGESTERONE ACETATE 150 MG/ML IM SUSP: 150.0000 mg | INTRAMUSCULAR | 3 refills | Status: DC

## 2017-10-01 NOTE — Progress Notes (Unsigned)
GYNECOLOGY ANNUAL PHYSICAL EXAM PROGRESS NOTE  Subjective:    Wendy Green is a 42 y.o. [place] female who presents for an annual exam. The patient has no complaints today. The patient {is/is not/has never been:13135} sexually active. GYN screening history: {gyn screen history:13142}. The patient wears seatbelts: {yes/no:311178}. The patient participates in regular exercise: {yes/no/not asked:9010}. Has the patient ever been transfused or tattooed?: {yes/no/not asked:9010}. The patient reports that there {is/is not:9024} domestic violence in her life.    Gynecologic History Patient's last menstrual period was 09/27/2016 (exact date). Menstrual History: OB History    Gravida Para Term Preterm AB Living   0 0 0 0 0 0   SAB TAB Ectopic Multiple Live Births   0 0 0 0 0      Menarche age: *** Patient's last menstrual period was 09/27/2016 (exact date).    Contraception: {method:5051} History of STI's:  Last Pap: ***. Results were: {norm/abn:16337}.  ***Denies/Notes h/o abnormal pap smears. Last mammogram: ***. Results were: {norm/abn:16337}   Obstetric History   G0   P0   T0   P0   A0   L0    SAB0   TAB0   Ectopic0   Multiple0   Live Births0       Past Medical History:  Diagnosis Date  . Abnormal weight gain 02/24/2015  . Cerebral seizure 02/24/2015   dx at age 31.   . Changeable mood 02/24/2015  . Epilepsy ([place]) 05/16/2009  . Episodic mood disorder ([place]) 09/11/2015  . Family history of adverse reaction to anesthesia    mother has nausea and vomiting.  Marland Kitchen Headache, migraine 09/11/2015  . Hematuria 06/04/2016  . High cholesterol   . History of kidney stones   . HLD (hyperlipidemia) 05/16/2009  . Intellectual disability 02/24/2015  . Seizure ([place]) 09/11/2015    Past Surgical History:  Procedure Laterality Date  . CYSTOSCOPY W/ RETROGRADES Bilateral 07/16/2016   Procedure: CYSTOSCOPY WITH RETROGRADE PYELOGRAM;  Surgeon: Hollice Espy, MD;  Location: [facility] ORS;  Service:  Urology;  Laterality: Bilateral;  . NO PAST SURGERIES      Family History  Problem Relation Age of Onset  . Heart attack Father   . Stroke Maternal Grandmother   . Pneumonia Maternal Grandfather   . Cancer Paternal Grandfather   . Prostate cancer Neg Hx   . Kidney cancer Neg Hx   . Breast cancer Neg Hx   . Ovarian cancer Neg Hx   . Colon cancer Neg Hx   . Diabetes Neg Hx     Social History   Socioeconomic History  . Marital status: Single    Spouse name: Not on file  . Number of children: Not on file  . Years of education: Not on file  . Highest education level: Not on file  Social Needs  . Financial resource strain: Not on file  . Food insecurity - worry: Not on file  . Food insecurity - inability: Not on file  . Transportation needs - medical: Not on file  . Transportation needs - non-medical: Not on file  Occupational History  . Not on file  Tobacco Use  . Smoking status: Never Smoker  . Smokeless tobacco: Never Used  Substance and Sexual Activity  . Alcohol use: No    Alcohol/week: 0.0 oz  . Drug use: No  . Sexual activity: Not Currently    Birth control/protection: None  Other Topics Concern  . Not on file  Social History Narrative  .  Not on file    Current Outpatient Medications on File Prior to Visit  Medication Sig Dispense Refill  . acetaZOLAMIDE (DIAMOX) 250 MG tablet TAKE 1 TABLET BY MOUTH 3 TIMES A DAY 90 tablet 3  . CAMILA 0.35 MG tablet TAKE 1 TABLET (0.35 MG TOTAL) BY MOUTH DAILY. 28 tablet 11  . fluticasone (FLONASE) 50 MCG/ACT nasal spray Place into both nostrils daily.    . folic acid (FOLVITE) 1 MG tablet TAKE 1 TABLET BY MOUTH ONCE DAILY    . KRILL OIL PO Take 1 capsule by mouth 2 (two) times daily.     . MULTIPLE VITAMIN PO 1 tablet daily.    . OXcarbazepine (TRILEPTAL) 150 MG tablet Take 1 tablet by mouth 2 (two) times daily.    . primidone (MYSOLINE) 250 MG tablet TAKE 1 TABLET BY MOUTH THREE TIMES DAILY AND 1/2 TABLET IN THE EVENING  100 tablet 3  . simvastatin (ZOCOR) 20 MG tablet TAKE 1 TABLET (20 MG TOTAL) BY MOUTH AT BEDTIME. 30 tablet 3  . Wheat Dextrin (BENEFIBER PO) Take by mouth.     No current facility-administered medications on file prior to visit.     Allergies  Allergen Reactions  . Aspirin       Review of Systems Constitutional: negative for chills, fatigue, fevers and sweats Eyes: negative for irritation, redness and visual disturbance Ears, nose, mouth, throat, and face: negative for hearing loss, nasal congestion, snoring and tinnitus Respiratory: negative for asthma, cough, sputum Cardiovascular: negative for chest pain, dyspnea, exertional chest pressure/discomfort, irregular heart beat, palpitations and syncope Gastrointestinal: negative for abdominal pain, change in bowel habits, nausea and vomiting Genitourinary: negative for abnormal menstrual periods, genital lesions, sexual problems and vaginal discharge, dysuria and urinary incontinence Integument/breast: negative for breast lump, breast tenderness and nipple discharge Hematologic/lymphatic: negative for bleeding and easy bruising Musculoskeletal:negative for back pain and muscle weakness Neurological: negative for dizziness, headaches, vertigo and weakness Endocrine: negative for diabetic symptoms including polydipsia, polyuria and skin dryness Allergic/Immunologic: negative for hay fever and urticaria        Objective:  Blood pressure 105/71, pulse 67, height 5\' 5"  (1.651 m), weight 166 lb (75.3 kg), last menstrual period 09/27/2016. Body mass index is 27.62 kg/m.     General Appearance:    Alert, cooperative, no distress, appears stated age  Head:    Normocephalic, without obvious abnormality, atraumatic  Eyes:    PERRL, conjunctiva/corneas clear, EOM's intact, both eyes  Ears:    Normal external ear canals, both ears  Nose:   Nares normal, septum midline, mucosa normal, no drainage or sinus tenderness  Throat:   Lips,  mucosa, and tongue normal; teeth and gums normal  Neck:   Supple, symmetrical, trachea midline, no adenopathy; thyroid: no enlargement/tenderness/nodules; no carotid bruit or JVD  Back:     Symmetric, no curvature, ROM normal, no CVA tenderness  Lungs:     Clear to auscultation bilaterally, respirations unlabored  Chest Wall:    No tenderness or deformity   Heart:    Regular rate and rhythm, S1 and S2 normal, no murmur, rub or gallop  Breast Exam:    No tenderness, masses, or nipple abnormality  Abdomen:     Soft, non-tender, bowel sounds active all four quadrants, no masses, no organomegaly.    Genitalia:    Pelvic:external genitalia normal, vagina without lesions, discharge, or tenderness, rectovaginal septum  normal. Cervix normal in appearance, no cervical motion tenderness, no adnexal masses or tenderness.  Uterus normal size, shape, mobile, regular contours, nontender.  Rectal:    Normal external sphincter.  No hemorrhoids appreciated. Internal exam not done.   Extremities:   Extremities normal, atraumatic, no cyanosis or edema  Pulses:   2+ and symmetric all extremities  Skin:   Skin color, texture, turgor normal, no rashes or lesions  Lymph nodes:   Cervical, supraclavicular, and axillary nodes normal  Neurologic:   CNII-XII intact, normal strength, sensation and reflexes throughout   .  Labs:  Lab Results  Component Value Date   WBC 6.8 06/19/2017   HGB 13.9 06/19/2017   HCT 40.9 06/19/2017   MCV 96.0 06/19/2017   PLT 227 06/19/2017    Lab Results  Component Value Date   CREATININE 0.52 06/19/2017   BUN 10 06/19/2017   NA 136 06/19/2017   K 4.0 06/19/2017   CL 107 06/19/2017   CO2 19 (L) 06/19/2017    Lab Results  Component Value Date   ALT 15 06/19/2017   AST 14 06/19/2017   ALKPHOS 75 12/23/2016   BILITOT 0.2 06/19/2017    Lab Results  Component Value Date   TSH 3.200 12/23/2016     Assessment:    Healthy female exam.    Plan:     {gyn plan:13146}       Rubie Maid, MD [facility]

## 2017-10-07 ENCOUNTER — Ambulatory Visit (INDEPENDENT_AMBULATORY_CARE_PROVIDER_SITE_OTHER): Payer: Medicare Other | Admitting: Obstetrics and Gynecology

## 2017-10-07 DIAGNOSIS — Z3042 Encounter for surveillance of injectable contraceptive: Secondary | ICD-10-CM

## 2017-10-07 MED ORDER — MEDROXYPROGESTERONE ACETATE 150 MG/ML IM SUSP
150.0000 mg | Freq: Once | INTRAMUSCULAR | Status: AC
  Administered 2017-10-07: 150 mg via INTRAMUSCULAR

## 2017-10-07 NOTE — Progress Notes (Signed)
Pt is here for her 1st depo provera inj, she tolerated well

## 2017-10-07 NOTE — Progress Notes (Signed)
I have reviewed the record and concur with patient management and plan.  Bernadean Saling, MD [facility]     

## 2017-10-17 ENCOUNTER — Encounter: Payer: Self-pay | Admitting: Family Medicine

## 2017-10-17 ENCOUNTER — Ambulatory Visit (INDEPENDENT_AMBULATORY_CARE_PROVIDER_SITE_OTHER): Payer: Medicare Other | Admitting: Family Medicine

## 2017-10-17 VITALS — BP 102/64 | HR 70 | Temp 97.9°F | Wt 166.2 lb

## 2017-10-17 DIAGNOSIS — G40309 Generalized idiopathic epilepsy and epileptic syndromes, not intractable, without status epilepticus: Secondary | ICD-10-CM

## 2017-10-17 DIAGNOSIS — E782 Mixed hyperlipidemia: Secondary | ICD-10-CM

## 2017-10-17 NOTE — Progress Notes (Signed)
Patient: Wendy Green Female    DOB: 01-31-1976   43 y.o.   MRN: [id number] Visit Date: 10/17/2017  Today's Provider: Vernie Murders, PA   Chief Complaint  Patient presents with  . Hyperlipidemia    follow up   Subjective:    HPI  Lipid/Cholesterol, Follow-up:   Last seen for this4 months ago.  Management changes since that visit include Patient advised to continue Simvastatin 20 mg, Krill oil and Benifiber, walk 30 mins 4-5 days per week,and follow a low fat diet.   Last Lipid Panel:    Component Value Date/Time   CHOL 197 06/19/2017 0901   CHOL 185 03/10/2017 0849   TRIG 168 (H) 06/19/2017 0901   HDL 54 06/19/2017 0901   HDL 51 03/10/2017 0849   CHOLHDL 3.6 06/19/2017 0901   LDLCALC 100 (H) 03/10/2017 0849    Risk factors for vascular disease include hypercholesterolemia  She reports fair compliance with treatment. She is not having side effects.  Current symptoms include none Weight trend: stable Current diet: well balanced Current exercise: walking  Wt Readings from Last 3 Encounters:  10/17/17 166 lb 3.2 oz (75.4 kg)  10/01/17 166 lb (75.3 kg)  09/15/17 168 lb (76.2 kg)    -------------------------------------------------------------------  Past Medical History:  Diagnosis Date  . Abnormal weight gain 02/24/2015  . Cerebral seizure 02/24/2015   dx at age 88.   . Changeable mood 02/24/2015  . Epilepsy ([place]) 05/16/2009  . Episodic mood disorder ([place]) 09/11/2015  . Family history of adverse reaction to anesthesia    mother has nausea and vomiting.  Marland Kitchen Headache, migraine 09/11/2015  . Hematuria 06/04/2016  . High cholesterol   . History of kidney stones   . HLD (hyperlipidemia) 05/16/2009  . Intellectual disability 02/24/2015  . Seizure ([place]) 09/11/2015   Past Surgical History:  Procedure Laterality Date  . CYSTOSCOPY W/ RETROGRADES Bilateral 07/16/2016   Procedure: CYSTOSCOPY WITH RETROGRADE PYELOGRAM;  Surgeon: Hollice Espy, MD;  Location:  [facility] ORS;  Service: Urology;  Laterality: Bilateral;  . NO PAST SURGERIES     Family History  Problem Relation Age of Onset  . Heart attack Father   . Stroke Maternal Grandmother   . Pneumonia Maternal Grandfather   . Cancer Paternal Grandfather   . Prostate cancer Neg Hx   . Kidney cancer Neg Hx   . Breast cancer Neg Hx   . Ovarian cancer Neg Hx   . Colon cancer Neg Hx   . Diabetes Neg Hx    Allergies  Allergen Reactions  . Aspirin     Current Outpatient Medications:  .  acetaZOLAMIDE (DIAMOX) 250 MG tablet, TAKE 1 TABLET BY MOUTH 3 TIMES A DAY, Disp: 90 tablet, Rfl: 3 .  fluticasone (FLONASE) 50 MCG/ACT nasal spray, Place into both nostrils daily., Disp: , Rfl:  .  folic acid (FOLVITE) 1 MG tablet, TAKE 1 TABLET BY MOUTH ONCE DAILY, Disp: , Rfl:  .  KRILL OIL PO, Take 1 capsule by mouth 2 (two) times daily. , Disp: , Rfl:  .  medroxyPROGESTERone (DEPO-PROVERA) 150 MG/ML injection, Inject 1 mL (150 mg total) into the muscle every 3 (three) months., Disp: 1 mL, Rfl: 3 .  MULTIPLE VITAMIN PO, 1 tablet daily., Disp: , Rfl:  .  OXcarbazepine (TRILEPTAL) 150 MG tablet, Take 1 tablet by mouth 2 (two) times daily., Disp: , Rfl:  .  primidone (MYSOLINE) 250 MG tablet, TAKE 1 TABLET BY MOUTH THREE TIMES  DAILY AND 1/2 TABLET IN THE EVENING, Disp: 100 tablet, Rfl: 3 .  simvastatin (ZOCOR) 20 MG tablet, TAKE 1 TABLET (20 MG TOTAL) BY MOUTH AT BEDTIME., Disp: 30 tablet, Rfl: 3 .  Wheat Dextrin (BENEFIBER PO), Take by mouth., Disp: , Rfl:   Review of Systems  Constitutional: Negative.   Respiratory: Negative.   Cardiovascular: Negative.   Musculoskeletal: Negative.     Social History   Tobacco Use  . Smoking status: Never Smoker  . Smokeless tobacco: Never Used  Substance Use Topics  . Alcohol use: No    Alcohol/week: 0.0 oz   Objective:   BP 102/64   Pulse 70   Temp 97.9 F (36.6 C) (Oral)   Wt 166 lb 3.2 oz (75.4 kg)   SpO2 98%   BMI 27.66 kg/m   Physical Exam    Constitutional: She is oriented to person, place, and time. She appears well-developed and well-nourished. No distress.  HENT:  Head: Normocephalic and atraumatic.  Right Ear: Hearing normal.  Left Ear: Hearing normal.  Nose: Nose normal.  Eyes: Conjunctivae and lids are normal. Right eye exhibits no discharge. Left eye exhibits no discharge. No scleral icterus.  Neck: Neck supple.  Cardiovascular: Normal rate and regular rhythm.  Pulmonary/Chest: Effort normal. No respiratory distress.  Musculoskeletal: Normal range of motion.  Neurological: She is alert and oriented to person, place, and time.  Skin: Skin is intact. No lesion and no rash noted.  Psychiatric: Her speech is normal.      Assessment & Plan:     1. Mixed hyperlipidemia Weight is stable and mother helps control diet. Still taking Krill oil BID with Simvastatin 20 mg hs. Will recheck labs to assess progress. Follow up pending reports. - Comprehensive metabolic panel - Lipid panel - CBC with Differential/Platelet  2. Generalized idiopathic epilepsy, not intractable, without status epilepticus ([place]) Followed by Dr. Melrose Nakayama (neurologist). Had EEG follow up on 08-28-17 showing no changes - consistent with primary generalized seizure disorder. Continues trileptal 150 mg BID, Primidone 250 mg 1 table TID and 1/2 tablets each evening with Diamox 250 mg TID. Recheck routine labs and continue regular follow up with Dr. Melrose Nakayama. - Comprehensive metabolic panel - CBC with Differential/Platelet       Vernie Murders, PA  [facility]

## 2017-10-20 DIAGNOSIS — E782 Mixed hyperlipidemia: Secondary | ICD-10-CM | POA: Diagnosis not present

## 2017-10-20 DIAGNOSIS — G40309 Generalized idiopathic epilepsy and epileptic syndromes, not intractable, without status epilepticus: Secondary | ICD-10-CM | POA: Diagnosis not present

## 2017-10-21 LAB — CBC WITH DIFFERENTIAL/PLATELET
BASOS: 0 %
Basophils Absolute: 0 10*3/uL (ref 0.0–0.2)
EOS (ABSOLUTE): 0.4 10*3/uL (ref 0.0–0.4)
EOS: 6 %
HEMATOCRIT: 41.6 % (ref 34.0–46.6)
HEMOGLOBIN: 13.8 g/dL (ref 11.1–15.9)
Immature Grans (Abs): 0 10*3/uL (ref 0.0–0.1)
Immature Granulocytes: 0 %
Lymphocytes Absolute: 1.6 10*3/uL (ref 0.7–3.1)
Lymphs: 26 %
MCH: 33 pg (ref 26.6–33.0)
MCHC: 33.2 g/dL (ref 31.5–35.7)
MCV: 100 fL — AB (ref 79–97)
MONOCYTES: 9 %
Monocytes Absolute: 0.6 10*3/uL (ref 0.1–0.9)
NEUTROS ABS: 3.6 10*3/uL (ref 1.4–7.0)
Neutrophils: 59 %
Platelets: 232 10*3/uL (ref 150–379)
RBC: 4.18 x10E6/uL (ref 3.77–5.28)
RDW: 13.7 % (ref 12.3–15.4)
WBC: 6.3 10*3/uL (ref 3.4–10.8)

## 2017-10-21 LAB — COMPREHENSIVE METABOLIC PANEL
A/G RATIO: 1.6 (ref 1.2–2.2)
ALK PHOS: 63 IU/L (ref 39–117)
ALT: 19 IU/L (ref 0–32)
AST: 12 IU/L (ref 0–40)
Albumin: 4 g/dL (ref 3.5–5.5)
BUN/Creatinine Ratio: 19 (ref 9–23)
BUN: 12 mg/dL (ref 6–24)
CHLORIDE: 110 mmol/L — AB (ref 96–106)
CO2: 17 mmol/L — ABNORMAL LOW (ref 20–29)
Calcium: 9.1 mg/dL (ref 8.7–10.2)
Creatinine, Ser: 0.63 mg/dL (ref 0.57–1.00)
GFR calc non Af Amer: 112 mL/min/{1.73_m2} (ref >59)
GFR, EST AFRICAN AMERICAN: 129 mL/min/{1.73_m2} (ref >59)
GLUCOSE: 93 mg/dL (ref 65–99)
Globulin, Total: 2.5 g/dL (ref 1.5–4.5)
POTASSIUM: 4 mmol/L (ref 3.5–5.2)
Sodium: 143 mmol/L (ref 134–144)
TOTAL PROTEIN: 6.5 g/dL (ref 6.0–8.5)

## 2017-10-21 LAB — LIPID PANEL
CHOLESTEROL TOTAL: 178 mg/dL (ref 100–199)
Chol/HDL Ratio: 3.4 ratio (ref 0.0–4.4)
HDL: 53 mg/dL (ref >39)
LDL Calculated: 98 mg/dL (ref 0–99)
TRIGLYCERIDES: 136 mg/dL (ref 0–149)
VLDL CHOLESTEROL CAL: 27 mg/dL (ref 5–40)

## 2017-10-23 DIAGNOSIS — L821 Other seborrheic keratosis: Secondary | ICD-10-CM | POA: Diagnosis not present

## 2017-10-23 DIAGNOSIS — L853 Xerosis cutis: Secondary | ICD-10-CM | POA: Diagnosis not present

## 2017-10-23 DIAGNOSIS — D2272 Melanocytic nevi of left lower limb, including hip: Secondary | ICD-10-CM | POA: Diagnosis not present

## 2017-10-23 DIAGNOSIS — D2261 Melanocytic nevi of right upper limb, including shoulder: Secondary | ICD-10-CM | POA: Diagnosis not present

## 2017-11-26 ENCOUNTER — Other Ambulatory Visit: Payer: Self-pay | Admitting: Family Medicine

## 2017-11-26 DIAGNOSIS — E782 Mixed hyperlipidemia: Secondary | ICD-10-CM

## 2017-12-26 ENCOUNTER — Ambulatory Visit (INDEPENDENT_AMBULATORY_CARE_PROVIDER_SITE_OTHER): Payer: Medicare Other | Admitting: Obstetrics and Gynecology

## 2017-12-26 VITALS — BP 99/69 | HR 82 | Ht 65.0 in | Wt 167.6 lb

## 2017-12-26 DIAGNOSIS — Z3042 Encounter for surveillance of injectable contraceptive: Secondary | ICD-10-CM

## 2017-12-26 MED ORDER — LEUPROLIDE ACETATE 3.75 MG IM KIT: 3.7500 mg | PACK | Freq: Once | INTRAMUSCULAR | Status: DC

## 2017-12-26 MED ORDER — MEDROXYPROGESTERONE ACETATE 150 MG/ML IM SUSP
150.0000 mg | Freq: Once | INTRAMUSCULAR | Status: AC
  Administered 2017-12-26: 150 mg via INTRAMUSCULAR

## 2017-12-26 NOTE — Progress Notes (Signed)
      BP 99/69   Pulse 82   Ht 5\' 5"  (1.651 m)   Wt 167 lb 9.6 oz (76 kg)   BMI 27.89 kg/m     Date last pap: 10/01/17 Last Depo-Provera: 10/07/17 Side Effects if any: no Serum HCG indicated? na Depo-Provera 150 mg IM given by: FH. Next appointment due June 21-Mar 27, 2018.Marland Kitchen

## 2018-01-01 ENCOUNTER — Ambulatory Visit (INDEPENDENT_AMBULATORY_CARE_PROVIDER_SITE_OTHER): Payer: Medicare Other | Admitting: Family Medicine

## 2018-01-01 ENCOUNTER — Ambulatory Visit (INDEPENDENT_AMBULATORY_CARE_PROVIDER_SITE_OTHER): Payer: Medicare Other

## 2018-01-01 VITALS — BP 108/62 | HR 68 | Temp 98.2°F | Ht 65.0 in | Wt 166.4 lb

## 2018-01-01 VITALS — BP 108/62 | HR 68 | Temp 98.2°F | Resp 18 | Ht 65.0 in | Wt 166.4 lb

## 2018-01-01 DIAGNOSIS — F79 Unspecified intellectual disabilities: Secondary | ICD-10-CM

## 2018-01-01 DIAGNOSIS — F39 Unspecified mood [affective] disorder: Secondary | ICD-10-CM | POA: Diagnosis not present

## 2018-01-01 DIAGNOSIS — E782 Mixed hyperlipidemia: Secondary | ICD-10-CM

## 2018-01-01 DIAGNOSIS — G40309 Generalized idiopathic epilepsy and epileptic syndromes, not intractable, without status epilepticus: Secondary | ICD-10-CM | POA: Diagnosis not present

## 2018-01-01 DIAGNOSIS — Z0000 Encounter for general adult medical examination without abnormal findings: Secondary | ICD-10-CM

## 2018-01-01 LAB — POCT URINALYSIS DIPSTICK
BILIRUBIN UA: NEGATIVE
Glucose, UA: NEGATIVE
Ketones, UA: NEGATIVE
LEUKOCYTES UA: NEGATIVE
Nitrite, UA: NEGATIVE
PH UA: 7.5 (ref 5.0–8.0)
Protein, UA: NEGATIVE
RBC UA: NEGATIVE
UROBILINOGEN UA: 0.2 U/dL

## 2018-01-01 NOTE — Patient Instructions (Addendum)
Wendy Green , Thank you for taking time to come for your Medicare Wellness Visit. I appreciate your ongoing commitment to your health goals. Please review the following plan we discussed and let me know if I can assist you in the future.   Screening recommendations/referrals: Colonoscopy: N/A Mammogram: Up to date Bone Density: N/A Recommended yearly ophthalmology/optometry visit for glaucoma screening and checkup Recommended yearly dental visit for hygiene and checkup  Vaccinations: Influenza vaccine: Up to date Pneumococcal vaccine: N/A Tdap vaccine: Up to date Shingles vaccine: N/A    Advanced directives: Advance directive discussed with you today. Even though you declined this today please call our office should you change your mind and we can give you the proper paperwork for you to fill out.  Conditions/risks identified: Recommend increasing water intake to 4-6 glasses a day.   Next appointment: 11:20 AM today with Vernie Murders.  Preventive Care 40-64 Years, Female Preventive care refers to lifestyle choices and visits with your health care provider that can promote health and wellness. What does preventive care include?  A yearly physical exam. This is also called an annual well check.  Dental exams once or twice a year.  Routine eye exams. Ask your health care provider how often you should have your eyes checked.  Personal lifestyle choices, including:  Daily care of your teeth and gums.  Regular physical activity.  Eating a healthy diet.  Avoiding tobacco and drug use.  Limiting alcohol use.  Practicing safe sex.  Taking low-dose aspirin daily starting at age 8.  Taking vitamin and mineral supplements as recommended by your health care provider. What happens during an annual well check? The services and screenings done by your health care provider during your annual well check will depend on your age, overall health, lifestyle risk factors, and family  history of disease. Counseling  Your health care provider may ask you questions about your:  Alcohol use.  Tobacco use.  Drug use.  Emotional well-being.  Home and relationship well-being.  Sexual activity.  Eating habits.  Work and work Statistician.  Method of birth control.  Menstrual cycle.  Pregnancy history. Screening  You may have the following tests or measurements:  Height, weight, and BMI.  Blood pressure.  Lipid and cholesterol levels. These may be checked every 5 years, or more frequently if you are over 10 years old.  Skin check.  Lung cancer screening. You may have this screening every year starting at age 19 if you have a 30-pack-year history of smoking and currently smoke or have quit within the past 15 years.  Fecal occult blood test (FOBT) of the stool. You may have this test every year starting at age 35.  Flexible sigmoidoscopy or colonoscopy. You may have a sigmoidoscopy every 5 years or a colonoscopy every 10 years starting at age 57.  Hepatitis C blood test.  Hepatitis B blood test.  Sexually transmitted disease (STD) testing.  Diabetes screening. This is done by checking your blood sugar (glucose) after you have not eaten for a while (fasting). You may have this done every 1-3 years.  Mammogram. This may be done every 1-2 years. Talk to your health care provider about when you should start having regular mammograms. This may depend on whether you have a family history of breast cancer.  BRCA-related cancer screening. This may be done if you have a family history of breast, ovarian, tubal, or peritoneal cancers.  Pelvic exam and Pap test. This may be done every  3 years starting at age 69. Starting at age 68, this may be done every 5 years if you have a Pap test in combination with an HPV test.  Bone density scan. This is done to screen for osteoporosis. You may have this scan if you are at high risk for osteoporosis. Discuss your test  results, treatment options, and if necessary, the need for more tests with your health care provider. Vaccines  Your health care provider may recommend certain vaccines, such as:  Influenza vaccine. This is recommended every year.  Tetanus, diphtheria, and acellular pertussis (Tdap, Td) vaccine. You may need a Td booster every 10 years.  Zoster vaccine. You may need this after age 15.  Pneumococcal 13-valent conjugate (PCV13) vaccine. You may need this if you have certain conditions and were not previously vaccinated.  Pneumococcal polysaccharide (PPSV23) vaccine. You may need one or two doses if you smoke cigarettes or if you have certain conditions. Talk to your health care provider about which screenings and vaccines you need and how often you need them. This information is not intended to replace advice given to you by your health care provider. Make sure you discuss any questions you have with your health care provider. Document Released: 10/06/2015 Document Revised: 05/29/2016 Document Reviewed: 07/11/2015 Elsevier Interactive Patient Education  2017 [place] Prevention in the Home Falls can cause injuries. They can happen to people of all ages. There are many things you can do to make your home safe and to help prevent falls. What can I do on the outside of my home?  Regularly fix the edges of walkways and driveways and fix any cracks.  Remove anything that might make you trip as you walk through a door, such as a raised step or threshold.  Trim any bushes or trees on the path to your home.  Use bright outdoor lighting.  Clear any walking paths of anything that might make someone trip, such as rocks or tools.  Regularly check to see if handrails are loose or broken. Make sure that both sides of any steps have handrails.  Any raised decks and porches should have guardrails on the edges.  Have any leaves, snow, or ice cleared regularly.  Use sand or salt on  walking paths during winter.  Clean up any spills in your garage right away. This includes oil or grease spills. What can I do in the bathroom?  Use night lights.  Install grab bars by the toilet and in the tub and shower. Do not use towel bars as grab bars.  Use non-skid mats or decals in the tub or shower.  If you need to sit down in the shower, use a plastic, non-slip stool.  Keep the floor dry. Clean up any water that spills on the floor as soon as it happens.  Remove soap buildup in the tub or shower regularly.  Attach bath mats securely with double-sided non-slip rug tape.  Do not have throw rugs and other things on the floor that can make you trip. What can I do in the bedroom?  Use night lights.  Make sure that you have a light by your bed that is easy to reach.  Do not use any sheets or blankets that are too big for your bed. They should not hang down onto the floor.  Have a firm chair that has side arms. You can use this for support while you get dressed.  Do not have throw  rugs and other things on the floor that can make you trip. What can I do in the kitchen?  Clean up any spills right away.  Avoid walking on wet floors.  Keep items that you use a lot in easy-to-reach places.  If you need to reach something above you, use a strong step stool that has a grab bar.  Keep electrical cords out of the way.  Do not use floor polish or wax that makes floors slippery. If you must use wax, use non-skid floor wax.  Do not have throw rugs and other things on the floor that can make you trip. What can I do with my stairs?  Do not leave any items on the stairs.  Make sure that there are handrails on both sides of the stairs and use them. Fix handrails that are broken or loose. Make sure that handrails are as long as the stairways.  Check any carpeting to make sure that it is firmly attached to the stairs. Fix any carpet that is loose or worn.  Avoid having throw  rugs at the top or bottom of the stairs. If you do have throw rugs, attach them to the floor with carpet tape.  Make sure that you have a light switch at the top of the stairs and the bottom of the stairs. If you do not have them, ask someone to add them for you. What else can I do to help prevent falls?  Wear shoes that:  Do not have high heels.  Have rubber bottoms.  Are comfortable and fit you well.  Are closed at the toe. Do not wear sandals.  If you use a stepladder:  Make sure that it is fully opened. Do not climb a closed stepladder.  Make sure that both sides of the stepladder are locked into place.  Ask someone to hold it for you, if possible.  Clearly mark and make sure that you can see:  Any grab bars or handrails.  First and last steps.  Where the edge of each step is.  Use tools that help you move around (mobility aids) if they are needed. These include:  Canes.  Walkers.  Scooters.  Crutches.  Turn on the lights when you go into a dark area. Replace any light bulbs as soon as they burn out.  Set up your furniture so you have a clear path. Avoid moving your furniture around.  If any of your floors are uneven, fix them.  If there are any pets around you, be aware of where they are.  Review your medicines with your doctor. Some medicines can make you feel dizzy. This can increase your chance of falling. Ask your doctor what other things that you can do to help prevent falls. This information is not intended to replace advice given to you by your health care provider. Make sure you discuss any questions you have with your health care provider. Document Released: 07/06/2009 Document Revised: 02/15/2016 Document Reviewed: 10/14/2014 Elsevier Interactive Patient Education  2017 [employer].

## 2018-01-01 NOTE — Progress Notes (Addendum)
Subjective:   Wendy Green is a 42 y.o. female who presents for Medicare Annual (Subsequent) preventive examination.  Review of Systems:  N/A  Cardiac Risk Factors include: advanced age (>74men, >25 women);dyslipidemia;obesity (BMI >30kg/m2)     Objective:     Vitals: BP 108/62 (BP Location: Right Arm)   Pulse 68   Temp 98.2 F (36.8 C) (Axillary)   Ht 5\' 5"  (1.651 m)   Wt 166 lb 6.4 oz (75.5 kg)   BMI 27.69 kg/m   Body mass index is 27.69 kg/m.  Advanced Directives 01/01/2018 07/16/2016 07/09/2016  Does Patient Have a Medical Advance Directive? No No No  Type of Advance Directive - - (No Data)  Would patient like information on creating a medical advance directive? No - Patient declined No - patient declined information No - patient declined information    Tobacco Social History   Tobacco Use  Smoking Status Never Smoker  Smokeless Tobacco Never Used     Counseling given: Not Answered   Clinical Intake:  Pre-visit preparation completed: Yes  Pain : No/denies pain Pain Score: 0-No pain     Nutritional Status: BMI 25 -29 Overweight Nutritional Risks: None Diabetes: No  How often do you need to have someone help you when you read instructions, pamphlets, or other written materials from your doctor or pharmacy?: 3 - Sometimes  Interpreter Needed?: No  Information entered by :: [facility], LPN  Past Medical History:  Diagnosis Date  . Abnormal weight gain 02/24/2015  . Cerebral seizure 02/24/2015   dx at age 62.   . Changeable mood 02/24/2015  . Epilepsy ([place]) 05/16/2009  . Episodic mood disorder ([place]) 09/11/2015  . Family history of adverse reaction to anesthesia    mother has nausea and vomiting.  Marland Kitchen Headache, migraine 09/11/2015  . Hematuria 06/04/2016  . High cholesterol   . History of kidney stones   . HLD (hyperlipidemia) 05/16/2009  . Intellectual disability 02/24/2015  . Seizure ([place]) 09/11/2015   Past Surgical History:  Procedure Laterality  Date  . CYSTOSCOPY W/ RETROGRADES Bilateral 07/16/2016   Procedure: CYSTOSCOPY WITH RETROGRADE PYELOGRAM;  Surgeon: Hollice Espy, MD;  Location: [facility] ORS;  Service: Urology;  Laterality: Bilateral;  . NO PAST SURGERIES     Family History  Problem Relation Age of Onset  . Heart attack Father   . Stroke Maternal Grandmother   . Pneumonia Maternal Grandfather   . Cancer Paternal Grandfather   . Prostate cancer Neg Hx   . Kidney cancer Neg Hx   . Breast cancer Neg Hx   . Ovarian cancer Neg Hx   . Colon cancer Neg Hx   . Diabetes Neg Hx    Social History   Socioeconomic History  . Marital status: Single    Spouse name: Not on file  . Number of children: 0  . Years of education: Not on file  . Highest education level: 12th grade  Occupational History  . Occupation: none  Social Needs  . Financial resource strain: Not hard at all  . Food insecurity:    Worry: Never true    Inability: Never true  . Transportation needs:    Medical: Not on file    Non-medical: Not on file  Tobacco Use  . Smoking status: Never Smoker  . Smokeless tobacco: Never Used  Substance and Sexual Activity  . Alcohol use: No    Alcohol/week: 0.0 oz  . Drug use: No  . Sexual activity: Not Currently  Birth control/protection: None, Injection  Lifestyle  . Physical activity:    Days per week: 0 days    Minutes per session: 0 min  . Stress: Not at all  Relationships  . Social connections:    Talks on phone: Not on file    Gets together: Not on file    Attends religious service: Not on file    Active member of club or organization: Not on file    Attends meetings of clubs or organizations: Not on file    Relationship status: Not on file  Other Topics Concern  . Not on file  Social History Narrative  . Not on file    Outpatient Encounter Medications as of 01/01/2018  Medication Sig  . acetaZOLAMIDE (DIAMOX) 250 MG tablet TAKE 1 TABLET BY MOUTH 3 TIMES A DAY  . fluticasone (FLONASE) 50  MCG/ACT nasal spray Place into both nostrils daily.  . folic acid (FOLVITE) 1 MG tablet TAKE 1 TABLET BY MOUTH ONCE DAILY  . KRILL OIL PO Take 1 capsule by mouth 2 (two) times daily.   . medroxyPROGESTERone (DEPO-PROVERA) 150 MG/ML injection Inject 1 mL (150 mg total) into the muscle every 3 (three) months.  . MULTIPLE VITAMIN PO 1 tablet daily.  . OXcarbazepine (TRILEPTAL) 150 MG tablet Take 1 tablet by mouth 2 (two) times daily.  . primidone (MYSOLINE) 250 MG tablet TAKE 1 TABLET BY MOUTH THREE TIMES DAILY AND 1/2 TABLET IN THE EVENING  . simvastatin (ZOCOR) 20 MG tablet TAKE 1 TABLET BY MOUTH EVERYDAY AT BEDTIME  . Wheat Dextrin (BENEFIBER PO) Take by mouth.   No facility-administered encounter medications on file as of 01/01/2018.     Activities of Daily Living In your present state of health, do you have any difficulty performing the following activities: 01/01/2018  Hearing? N  Vision? N  Difficulty concentrating or making decisions? N  Walking or climbing stairs? Y  Comment Due to having a weak side.  Dressing or bathing? Y  Comment Has trouble putting a bra on.   Doing errands, shopping? Y  Comment Does not drive.  Preparing Food and eating ? Y  Using the Toilet? N  In the past six months, have you accidently leaked urine? N  Do you have problems with loss of bowel control? Y  Comment Rare- wears protection daily.   Managing your Medications? N  Managing your Finances? N  Housekeeping or managing your Housekeeping? N  Some recent data might be hidden    Patient Care Team: Chrismon, Vickki Muff, PA as PCP - General (Physician Assistant) Rubie Maid, MD as Referring Physician (Obstetrics and Gynecology) Anabel Bene, MD as Referring Physician (Neurology) Pa, [facility] as Consulting Physician (Dermatology) Leata Mouse (Inactive) as Consulting Physician    Assessment:   This is a routine wellness examination for [place].  Exercise Activities and  Dietary recommendations Current Exercise Habits: The patient does not participate in regular exercise at present  Goals    . DIET - INCREASE WATER INTAKE     Recommend increasing water intake to 4-6 glasses a day.        Fall Risk Fall Risk  01/01/2018 12/19/2016  Falls in the past year? No No   Is the patient's home free of loose throw rugs in walkways, pet beds, electrical cords, etc?   yes      Grab bars in the bathroom? no      Handrails on the stairs?   yes  Adequate lighting?   yes  Timed Get Up and Go performed: N/A  Depression Screen PHQ 2/9 Scores 01/01/2018 12/19/2016  PHQ - 2 Score 0 0  PHQ- 9 Score - 0     Cognitive Function: Pt declined screening today.        Immunization History  Administered Date(s) Administered  . Influenza,inj,Quad PF,6+ Mos 06/06/2016, 06/17/2017  . Tdap 07/22/2011    Qualifies for Shingles Vaccine? N/A  Screening Tests Health Maintenance  Topic Date Due  . INFLUENZA VACCINE  04/23/2018  . PAP SMEAR  08/09/2019  . TETANUS/TDAP  07/21/2021  . HIV Screening  Completed    Cancer Screenings: Lung: Low Dose CT Chest recommended if Age 27-80 years, 30 pack-year currently smoking OR have quit w/in 15years. Patient does not qualify. Breast:  Up to date on Mammogram? Yes Up to date of Bone Density/Dexa? N/A Colorectal: N/A  Additional Screenings: Hepatitis C Screening: N/A     Plan:  I have personally reviewed and addressed the Medicare Annual Wellness questionnaire and have noted the following in the patient's chart:  A. Medical and social history B. Use of alcohol, tobacco or illicit drugs  C. Current medications and supplements D. Functional ability and status E.  Nutritional status F.  Physical activity G. Advance directives H. List of other physicians I.  Hospitalizations, surgeries, and ER visits in previous 12 months J.  Post such as hearing and vision if needed, cognitive and  depression L. Referrals and appointments - none  In addition, I have reviewed and discussed with patient certain preventive protocols, quality metrics, and best practice recommendations. A written personalized care plan for preventive services as well as general preventive health recommendations were provided to patient.  See attached scanned questionnaire for additional information.   Signed,  Fabio Neighbors, LPN Nurse Health Advisor   Nurse Recommendations: None.   Reviewed documentation and recommendations by Nurse Health Advisor. Agree with plan

## 2018-01-01 NOTE — Progress Notes (Signed)
Patient: Wendy Green, Female    DOB: 1975-10-30, 42 y.o.   MRN: [id number] Visit Date: 01/01/2018  Today's Provider: Vernie Murders, PA   Chief Complaint  Patient presents with  . Annual Exam   Subjective:    Annual physical exam Wendy Green is a 42 y.o. female who presents today for health maintenance and complete physical. She feels well. She reports exercising lightly. She reports she is sleeping well.  -----------------------------------------------------------------   Review of Systems  Constitutional: Negative.   HENT: Positive for drooling.   Eyes: Negative.   Respiratory: Negative.   Cardiovascular: Negative.   Gastrointestinal: Negative.   Endocrine: Negative.   Genitourinary: Negative.   Musculoskeletal: Positive for gait problem and myalgias (leg pain).  Skin: Negative.   Allergic/Immunologic: Negative.   Neurological: Positive for seizures and headaches.  Hematological: Negative.   Psychiatric/Behavioral: Negative.     Social History      She  reports that she has never smoked. She has never used smokeless tobacco. She reports that she does not drink alcohol or use drugs.       Social History   Socioeconomic History  . Marital status: Single    Spouse name: Not on file  . Number of children: 0  . Years of education: Not on file  . Highest education level: 12th grade  Occupational History  . Occupation: none  Social Needs  . Financial resource strain: Not hard at all  . Food insecurity:    Worry: Never true    Inability: Never true  . Transportation needs:    Medical: Not on file    Non-medical: Not on file  Tobacco Use  . Smoking status: Never Smoker  . Smokeless tobacco: Never Used  Substance and Sexual Activity  . Alcohol use: No    Alcohol/week: 0.0 oz  . Drug use: No  . Sexual activity: Not Currently    Birth control/protection: None, Injection  Lifestyle  . Physical activity:    Days per week: 0 days   Minutes per session: 0 min  . Stress: Not at all  Relationships  . Social connections:    Talks on phone: Not on file    Gets together: Not on file    Attends religious service: Not on file    Active member of club or organization: Not on file    Attends meetings of clubs or organizations: Not on file    Relationship status: Not on file  Other Topics Concern  . Not on file  Social History Narrative  . Not on file    Past Medical History:  Diagnosis Date  . Abnormal weight gain 02/24/2015  . Cerebral seizure 02/24/2015   dx at age 79.   . Changeable mood 02/24/2015  . Epilepsy ([place]) 05/16/2009  . Episodic mood disorder ([place]) 09/11/2015  . Family history of adverse reaction to anesthesia    mother has nausea and vomiting.  Marland Kitchen Headache, migraine 09/11/2015  . Hematuria 06/04/2016  . High cholesterol   . History of kidney stones   . HLD (hyperlipidemia) 05/16/2009  . Intellectual disability 02/24/2015  . Seizure ([place]) 09/11/2015     Patient Active Problem List   Diagnosis Date Noted  . Generalized idiopathic epilepsy, not intractable, without status epilepticus ([place]) 07/23/2017  . Hematuria 06/04/2016  . Headache, migraine 09/11/2015  . Episodic mood disorder ([place]) 09/11/2015  . Seizure ([place]) 09/11/2015  . Intellectual disability 02/24/2015  . Changeable mood 02/24/2015  .  Cerebral seizure 02/24/2015  . Abnormal weight gain 02/24/2015  . Epilepsy ([place]) 05/16/2009  . Hyperlipidemia, unspecified 05/16/2009    Past Surgical History:  Procedure Laterality Date  . CYSTOSCOPY W/ RETROGRADES Bilateral 07/16/2016   Procedure: CYSTOSCOPY WITH RETROGRADE PYELOGRAM;  Surgeon: Hollice Espy, MD;  Location: [facility] ORS;  Service: Urology;  Laterality: Bilateral;  . NO PAST SURGERIES      Family History        Family Status  Relation Name Status  . Mother  Alive  . Father  Deceased  . MGM  Deceased  . MGF  Deceased  . PGM  Deceased  . PGF  Deceased  . Neg Hx  (Not Specified)         Her family history includes Cancer in her paternal grandfather; Heart attack in her father; Pneumonia in her maternal grandfather; Stroke in her maternal grandmother. There is no history of Prostate cancer, Kidney cancer, Breast cancer, Ovarian cancer, Colon cancer, or Diabetes.     Allergies  Allergen Reactions  . Aspirin     Current Outpatient Medications:  .  acetaZOLAMIDE (DIAMOX) 250 MG tablet, TAKE 1 TABLET BY MOUTH 3 TIMES A DAY, Disp: 90 tablet, Rfl: 3 .  fluticasone (FLONASE) 50 MCG/ACT nasal spray, Place into both nostrils daily., Disp: , Rfl:  .  folic acid (FOLVITE) 1 MG tablet, TAKE 1 TABLET BY MOUTH ONCE DAILY, Disp: , Rfl:  .  KRILL OIL PO, Take 1 capsule by mouth 2 (two) times daily. , Disp: , Rfl:  .  medroxyPROGESTERone (DEPO-PROVERA) 150 MG/ML injection, Inject 1 mL (150 mg total) into the muscle every 3 (three) months., Disp: 1 mL, Rfl: 3 .  MULTIPLE VITAMIN PO, 1 tablet daily., Disp: , Rfl:  .  OXcarbazepine (TRILEPTAL) 150 MG tablet, Take 1 tablet by mouth 2 (two) times daily., Disp: , Rfl:  .  primidone (MYSOLINE) 250 MG tablet, TAKE 1 TABLET BY MOUTH THREE TIMES DAILY AND 1/2 TABLET IN THE EVENING, Disp: 100 tablet, Rfl: 3 .  simvastatin (ZOCOR) 20 MG tablet, TAKE 1 TABLET BY MOUTH EVERYDAY AT BEDTIME, Disp: 30 tablet, Rfl: 3 .  Wheat Dextrin (BENEFIBER PO), Take by mouth., Disp: , Rfl:    Patient Care Team: Oaklie Durrett, Vickki Muff, PA as PCP - General (Physician Assistant) Rubie Maid, MD as Referring Physician (Obstetrics and Gynecology) Anabel Bene, MD as Referring Physician (Neurology) Pa, [facility] as Consulting Physician (Dermatology) Leata Mouse (Inactive) as Consulting Physician     Objective:   Physical Exam  Constitutional: She appears well-developed and well-nourished.  HENT:  Head: Normocephalic and atraumatic.  Right Ear: External ear normal.  Left Ear: External ear normal.  Nose: Nose normal.  Mouth/Throat: Oropharynx is  clear and moist.  Eyes: Pupils are equal, round, and reactive to light. Conjunctivae and EOM are normal. Right eye exhibits no discharge.  Neck: Normal range of motion. Neck supple. No tracheal deviation present. No thyromegaly present.  Cardiovascular: Normal rate, regular rhythm, normal heart sounds and intact distal pulses.  No murmur heard. Pulmonary/Chest: Effort normal and breath sounds normal. No respiratory distress. She has no wheezes. She has no rales. She exhibits no tenderness.  Abdominal: Soft. She exhibits no distension and no mass. There is no tenderness. There is no rebound and no guarding.  Musculoskeletal: Normal range of motion. She exhibits no edema or tenderness.  Lymphadenopathy:    She has no cervical adenopathy.  Neurological: She is alert. She has normal reflexes. She  displays normal reflexes. No cranial nerve deficit. She exhibits normal muscle tone. Coordination normal.  Skin: Skin is warm and dry. No rash noted. No erythema.  Psychiatric: She has a normal mood and affect. Her behavior is normal. Judgment and thought content normal.   Depression Screen PHQ 2/9 Scores 01/01/2018 12/19/2016  PHQ - 2 Score 0 0  PHQ- 9 Score - 0   Assessment & Plan:     Routine Health Maintenance and Physical Exam  Exercise Activities and Dietary recommendations Goals    . DIET - INCREASE WATER INTAKE     Recommend increasing water intake to 4-6 glasses a day.        Immunization History  Administered Date(s) Administered  . Influenza,inj,Quad PF,6+ Mos 06/06/2016, 06/17/2017  . Tdap 07/22/2011    Health Maintenance  Topic Date Due  . INFLUENZA VACCINE  04/23/2018  . PAP SMEAR  08/09/2019  . TETANUS/TDAP  07/21/2021  . HIV Screening  Completed     Discussed health benefits of physical activity, and encouraged her to engage in regular exercise appropriate for her age and condition.    -------------------------------------------------------------------- 1. Annual  physical exam General health stable. Continue Depo-Provera injections every 3 months with good control of menses - prescribed by her GYN (Dr. Marcelline Mates). Completed medical report for attending the [facility]. - POCT Urinalysis Dipstick  2. Generalized idiopathic epilepsy, not intractable, without status epilepticus ([place]) Diagnosed at age 38 and followed by Dr. Melrose Nakayama (neurologist). No recent seizure activity. Controlled by Primidone 250 mg TID with 1/2 tablet at bedtime. Also, takes Trileptal 150 mg BID. Will get labs and share information with Dr. Melrose Nakayama. - CBC with Differential/Platelet - Comprehensive metabolic panel - TSH - Phenobarbital level  3. Intellectual disability History of congenital mental retardation. Lives with mother. Enjoys bowling. Some occasional mood changes but most often quiet and reserved.  4. Mixed hyperlipidemia Tolerating the Simvastatin 20 mg qd without side effects. Taken [employer] BID - Comprehensive metabolic panel - Lipid panel - TSH  5. Episodic mood disorder (HCC) Associated with mental disability and anxiety. Will check routine labs to rule out metabolic disorder/imbalance. - CBC with Differential/Platelet - Comprehensive metabolic panel - TSH    Vernie Murders, PA  [facility]

## 2018-01-02 DIAGNOSIS — F39 Unspecified mood [affective] disorder: Secondary | ICD-10-CM | POA: Diagnosis not present

## 2018-01-02 DIAGNOSIS — G40309 Generalized idiopathic epilepsy and epileptic syndromes, not intractable, without status epilepticus: Secondary | ICD-10-CM | POA: Diagnosis not present

## 2018-01-02 DIAGNOSIS — E782 Mixed hyperlipidemia: Secondary | ICD-10-CM | POA: Diagnosis not present

## 2018-01-03 LAB — COMPREHENSIVE METABOLIC PANEL
ALT: 22 IU/L (ref 0–32)
AST: 13 IU/L (ref 0–40)
Albumin/Globulin Ratio: 1.5 (ref 1.2–2.2)
Albumin: 4.1 g/dL (ref 3.5–5.5)
Alkaline Phosphatase: 74 IU/L (ref 39–117)
BUN/Creatinine Ratio: 23 (ref 9–23)
BUN: 13 mg/dL (ref 6–24)
Bilirubin Total: <0.2 mg/dL (ref 0.0–1.2)
CO2: 17 mmol/L — ABNORMAL LOW (ref 20–29)
Calcium: 9.3 mg/dL (ref 8.7–10.2)
Chloride: 110 mmol/L — ABNORMAL HIGH (ref 96–106)
Creatinine, Ser: 0.56 mg/dL — ABNORMAL LOW (ref 0.57–1.00)
GFR calc Af Amer: 134 mL/min/{1.73_m2} (ref >59)
GFR calc non Af Amer: 116 mL/min/{1.73_m2} (ref >59)
Globulin, Total: 2.7 g/dL (ref 1.5–4.5)
Glucose: 89 mg/dL (ref 65–99)
Potassium: 3.7 mmol/L (ref 3.5–5.2)
Sodium: 140 mmol/L (ref 134–144)
Total Protein: 6.8 g/dL (ref 6.0–8.5)

## 2018-01-03 LAB — CBC WITH DIFFERENTIAL/PLATELET
BASOS: 1 %
Basophils Absolute: 0 10*3/uL (ref 0.0–0.2)
EOS (ABSOLUTE): 0.4 10*3/uL (ref 0.0–0.4)
Eos: 6 %
HEMOGLOBIN: 13.7 g/dL (ref 11.1–15.9)
Hematocrit: 40.7 % (ref 34.0–46.6)
Immature Grans (Abs): 0 10*3/uL (ref 0.0–0.1)
Immature Granulocytes: 0 %
Lymphocytes Absolute: 1.7 10*3/uL (ref 0.7–3.1)
Lymphs: 27 %
MCH: 32.5 pg (ref 26.6–33.0)
MCHC: 33.7 g/dL (ref 31.5–35.7)
MCV: 96 fL (ref 79–97)
MONOCYTES: 8 %
Monocytes Absolute: 0.5 10*3/uL (ref 0.1–0.9)
NEUTROS ABS: 3.7 10*3/uL (ref 1.4–7.0)
Neutrophils: 58 %
Platelets: 236 10*3/uL (ref 150–379)
RBC: 4.22 x10E6/uL (ref 3.77–5.28)
RDW: 13.8 % (ref 12.3–15.4)
WBC: 6.3 10*3/uL (ref 3.4–10.8)

## 2018-01-03 LAB — TSH: TSH: 3.04 u[IU]/mL (ref 0.450–4.500)

## 2018-01-03 LAB — PHENOBARBITAL LEVEL: Phenobarbital, Serum: 20 ug/mL (ref 15–40)

## 2018-01-03 LAB — LIPID PANEL
Chol/HDL Ratio: 3.3 ratio (ref 0.0–4.4)
Cholesterol, Total: 187 mg/dL (ref 100–199)
HDL: 56 mg/dL (ref >39)
LDL Calculated: 106 mg/dL — ABNORMAL HIGH (ref 0–99)
Triglycerides: 124 mg/dL (ref 0–149)
VLDL Cholesterol Cal: 25 mg/dL (ref 5–40)

## 2018-01-05 ENCOUNTER — Telehealth: Payer: Self-pay

## 2018-01-05 NOTE — Telephone Encounter (Signed)
Attempted to contact patient. Busy signal. Will try again later

## 2018-01-05 NOTE — Telephone Encounter (Signed)
-----   Message from Margo Common, [place] sent at 01/05/2018  7:53 AM EDT ----- Blood tests essentially normal. Cholesterol improving - continue Simvastatin. Phenobarb level (Primidone) in the middle of the therapeutic range. If neurologist can't see this report for himself, let us know if you need a printed copy. Recheck cholesterol in 6 months.

## 2018-01-06 ENCOUNTER — Encounter: Payer: Self-pay | Admitting: Family Medicine

## 2018-01-07 NOTE — Telephone Encounter (Signed)
Patient's mother Remo Lipps advised. She requested labs be faxed to Dr. Potter(Neurologist at [facility]) Labs faxed to [phone]. Form for camp is completed and at front desk for pickup.

## 2018-01-19 ENCOUNTER — Encounter: Payer: Self-pay | Admitting: Obstetrics and Gynecology

## 2018-02-17 ENCOUNTER — Ambulatory Visit
Admission: RE | Admit: 2018-02-17 | Discharge: 2018-02-17 | Disposition: A | Discharge status: Home / Self Care | Payer: Medicare Other | Source: Ambulatory Visit | Attending: Obstetrics and Gynecology | Admitting: Obstetrics and Gynecology

## 2018-02-17 DIAGNOSIS — Z1239 Encounter for other screening for malignant neoplasm of breast: Secondary | ICD-10-CM

## 2018-02-17 DIAGNOSIS — Z1231 Encounter for screening mammogram for malignant neoplasm of breast: Secondary | ICD-10-CM | POA: Diagnosis present

## 2018-03-16 ENCOUNTER — Ambulatory Visit (INDEPENDENT_AMBULATORY_CARE_PROVIDER_SITE_OTHER): Payer: Medicare Other | Admitting: Obstetrics and Gynecology

## 2018-03-16 VITALS — BP 101/68 | HR 78 | Wt 164.4 lb

## 2018-03-16 DIAGNOSIS — Z3042 Encounter for surveillance of injectable contraceptive: Secondary | ICD-10-CM

## 2018-03-16 MED ORDER — MEDROXYPROGESTERONE ACETATE 150 MG/ML IM SUSP
150.0000 mg | Freq: Once | INTRAMUSCULAR | Status: AC
  Administered 2018-03-16: 150 mg via INTRAMUSCULAR

## 2018-03-16 NOTE — Progress Notes (Signed)
Date last pap: na Last Depo-Provera: 12/26/17 Side Effects if any: na Serum HCG indicated na Depo-Provera 150 mg IM given by: Keturah Barre, CMA Next appointment due Sept 9-Sept 23, 2019 BP 101/68   Pulse 78   Wt 164 lb 6.4 oz (74.6 kg)   BMI 27.36 kg/m

## 2018-03-18 NOTE — Progress Notes (Signed)
I have reviewed the record and concur with patient management and plan.  [name], MD [facility]     

## 2018-03-25 ENCOUNTER — Other Ambulatory Visit: Payer: Self-pay | Admitting: Family Medicine

## 2018-03-25 DIAGNOSIS — E782 Mixed hyperlipidemia: Secondary | ICD-10-CM

## 2018-06-01 ENCOUNTER — Ambulatory Visit (INDEPENDENT_AMBULATORY_CARE_PROVIDER_SITE_OTHER): Payer: Medicare Other | Admitting: Obstetrics and Gynecology

## 2018-06-01 VITALS — BP 103/64 | HR 88 | Ht 65.0 in | Wt 165.4 lb

## 2018-06-01 DIAGNOSIS — Z3042 Encounter for surveillance of injectable contraceptive: Secondary | ICD-10-CM

## 2018-06-01 MED ORDER — MEDROXYPROGESTERONE ACETATE 150 MG/ML IM SUSP
150.0000 mg | Freq: Once | INTRAMUSCULAR | Status: AC
  Administered 2018-06-01: 150 mg via INTRAMUSCULAR

## 2018-06-01 NOTE — Progress Notes (Signed)
Date last pap: na Last Depo-Provera: 03/16/18 Side Effects if any: na Serum HCG indicated? na Depo-Provera 150 mg IM given by: Keturah Barre, CMA Next appointment due Nov 25-Dec 9 th, 2019 BP 103/64   Pulse 88   Ht 5\' 5"  (1.651 m)   Wt 165 lb 6.4 oz (75 kg)   BMI 27.52 kg/m

## 2018-06-01 NOTE — Progress Notes (Signed)
I have reviewed the record and concur with patient management and plan.  Taren Dymek, MD [facility]     

## 2018-06-30 ENCOUNTER — Other Ambulatory Visit: Payer: Self-pay | Admitting: Family Medicine

## 2018-06-30 DIAGNOSIS — Z1231 Encounter for screening mammogram for malignant neoplasm of breast: Secondary | ICD-10-CM

## 2018-07-22 DIAGNOSIS — R569 Unspecified convulsions: Secondary | ICD-10-CM | POA: Diagnosis not present

## 2018-07-22 DIAGNOSIS — G40309 Generalized idiopathic epilepsy and epileptic syndromes, not intractable, without status epilepticus: Secondary | ICD-10-CM | POA: Diagnosis not present

## 2018-07-24 ENCOUNTER — Telehealth: Payer: Self-pay | Admitting: Family Medicine

## 2018-07-24 NOTE — Telephone Encounter (Signed)
Left vm advising to check with Dr Melrose Nakayama about labs and for mother to contact us if anything else needs to be ordered.  dbs

## 2018-07-24 NOTE — Telephone Encounter (Signed)
Mom called saying her daughter seen Dr. Melrose Nakayama on Wednesday and he did not see where her metabolic labs were done for the levels of drugs she is taking.  Mom wants to know if we did those labs and what the levels were.  If not can we get with Dr. Melrose Nakayama and get the labs ordered that she needs.  CB#  [phone]  Thanks  Con Memos

## 2018-07-24 NOTE — Telephone Encounter (Signed)
Please review

## 2018-07-24 NOTE — Telephone Encounter (Signed)
Got the phenobarbital level but overlooked the Trileptal level. Ask Dr. Melrose Nakayama if he needs anything more than these.

## 2018-07-27 NOTE — Telephone Encounter (Signed)
FYI

## 2018-07-27 NOTE — Telephone Encounter (Signed)
Will do!

## 2018-07-27 NOTE — Telephone Encounter (Signed)
Mom called saying the next time daughter has labs that we need to also draw for a  oxcarbazetine level per Dr. Melrose Nakayama.  teri

## 2018-07-29 ENCOUNTER — Ambulatory Visit (INDEPENDENT_AMBULATORY_CARE_PROVIDER_SITE_OTHER): Payer: Medicare Other

## 2018-07-29 DIAGNOSIS — Z23 Encounter for immunization: Secondary | ICD-10-CM | POA: Diagnosis not present

## 2018-08-12 ENCOUNTER — Other Ambulatory Visit: Payer: Self-pay | Admitting: Obstetrics and Gynecology

## 2018-08-13 NOTE — Progress Notes (Signed)
Date last pap: na Last Depo-Provera: 06/01/18 Side Effects if any: na Serum HCG indicated? na Depo-Provera 150 mg IM given by: AS, CMA Next appointment due 11/02/18-11/16/18 BP 103/69   Pulse 76   Ht 5\' 5"  (1.651 m)   Wt 163 lb (73.9 kg)   BMI 27.12 kg/m

## 2018-08-17 ENCOUNTER — Ambulatory Visit (INDEPENDENT_AMBULATORY_CARE_PROVIDER_SITE_OTHER): Payer: Medicare Other | Admitting: Obstetrics and Gynecology

## 2018-08-17 VITALS — BP 103/69 | HR 76 | Ht 65.0 in | Wt 163.0 lb

## 2018-08-17 DIAGNOSIS — Z3042 Encounter for surveillance of injectable contraceptive: Secondary | ICD-10-CM

## 2018-08-17 MED ORDER — MEDROXYPROGESTERONE ACETATE 150 MG/ML IM SUSP
150.0000 mg | Freq: Once | INTRAMUSCULAR | Status: AC
Start: 2018-08-17 — End: 2018-08-17
  Administered 2018-08-17: 150 mg via INTRAMUSCULAR

## 2018-08-25 ENCOUNTER — Encounter: Payer: Self-pay | Admitting: Family Medicine

## 2018-08-25 ENCOUNTER — Ambulatory Visit (INDEPENDENT_AMBULATORY_CARE_PROVIDER_SITE_OTHER): Payer: Medicare Other | Admitting: Family Medicine

## 2018-08-25 ENCOUNTER — Other Ambulatory Visit: Payer: Self-pay

## 2018-08-25 VITALS — BP 116/80 | HR 79 | Temp 98.5°F | Ht 65.0 in | Wt 164.6 lb

## 2018-08-25 DIAGNOSIS — H60502 Unspecified acute noninfective otitis externa, left ear: Secondary | ICD-10-CM | POA: Diagnosis not present

## 2018-08-25 MED ORDER — NEOMYCIN-POLYMYXIN-HC 3.5-10000-1 OT SOLN: OTIC | 0 refills | Status: DC

## 2018-08-25 NOTE — Progress Notes (Signed)
  Subjective:     Patient ID: Wendy Green, female   DOB: 10-24-1975, 42 y.o.   MRN: [id number] Chief Complaint  Patient presents with  . Ear Pain    odor coming from left ear started on 08/10/18    HPI Mom states she intially treated it with wax softener which improved her sx due to past hx of cerument impaction. Sx returned in the last 24 hours. Denies swimming or precedent cold sx. Accompanied by her mom today.  Review of Systems     Objective:   Physical Exam  Constitutional: She appears well-developed and well-nourished. No distress.  HENT:  Right ear canal with excessive cerumen; TM not well visualized. Left ear canal is patent but is moist with white appearing drainage which partially obscures her TM. No tragal tenderness       Assessment:    1. Acute otitis externa of left ear, unspecified type - neomycin-polymyxin-hydrocortisone (CORTISPORIN) OTIC solution; 4 drops in the affected ear 3-4 x day  Dispense: 10 mL; Refill: 0    Plan:    Call if not improving.

## 2018-08-25 NOTE — Patient Instructions (Signed)
Let me know if your ear is not improving over the next week.

## 2018-10-30 DIAGNOSIS — L821 Other seborrheic keratosis: Secondary | ICD-10-CM | POA: Diagnosis not present

## 2018-10-30 DIAGNOSIS — D2262 Melanocytic nevi of left upper limb, including shoulder: Secondary | ICD-10-CM | POA: Diagnosis not present

## 2018-10-30 DIAGNOSIS — D2272 Melanocytic nevi of left lower limb, including hip: Secondary | ICD-10-CM | POA: Diagnosis not present

## 2018-10-30 DIAGNOSIS — D225 Melanocytic nevi of trunk: Secondary | ICD-10-CM | POA: Diagnosis not present

## 2018-11-09 ENCOUNTER — Ambulatory Visit (INDEPENDENT_AMBULATORY_CARE_PROVIDER_SITE_OTHER): Payer: Medicare Other | Admitting: Obstetrics and Gynecology

## 2018-11-09 VITALS — BP 86/52 | HR 99 | Ht 64.0 in | Wt 163.7 lb

## 2018-11-09 DIAGNOSIS — Z3042 Encounter for surveillance of injectable contraceptive: Secondary | ICD-10-CM

## 2018-11-09 MED ORDER — MEDROXYPROGESTERONE ACETATE 150 MG/ML IM SUSP
150.0000 mg | Freq: Once | INTRAMUSCULAR | Status: AC
  Administered 2018-11-09: 150 mg via INTRAMUSCULAR

## 2018-11-09 NOTE — Progress Notes (Cosign Needed)
Date last pap: na Last Depo-Provera: 08/17/18 Side Effects if any: na Serum HCG indicated? No. Depo-Provera 150 mg IM given by: Keturah Barre, CMA Next appointment due May 5-May 19,2020. BP (!) 86/52   Pulse 99   Ht 5\' 4"  (1.626 m)   Wt 163 lb 11.2 oz (74.3 kg)   BMI 28.10 kg/m   It was discussed with pt and her mother she needs to schedule annual exam, before May, they both voiced understanding.

## 2018-11-13 NOTE — Progress Notes (Signed)
I have reviewed the record and concur with patient management and plan.  Aylinn Rydberg, MD [facility]     

## 2018-12-13 ENCOUNTER — Other Ambulatory Visit: Payer: Self-pay | Admitting: Family Medicine

## 2018-12-13 DIAGNOSIS — E782 Mixed hyperlipidemia: Secondary | ICD-10-CM

## 2019-01-04 ENCOUNTER — Ambulatory Visit: Payer: Self-pay

## 2019-01-25 NOTE — Progress Notes (Signed)
Last depo inj: 11/09/18 UPT: N/A Side effects: NONE Next Depo- Provera injection due: 04/14/19-04/28/19 Annual exam due: 08/09/19

## 2019-01-26 ENCOUNTER — Telehealth: Payer: Self-pay

## 2019-01-26 NOTE — Telephone Encounter (Signed)
Coronavirus (COVID-19) Are you at risk?  Are you at risk for the Coronavirus (COVID-19)?  To be considered HIGH RISK for Coronavirus (COVID-19), you have to meet the following criteria:  . Traveled to [place], [place], [place], [place] or [place]; or in the [place] to [place], [place], [place], or [place]; and have fever, cough, and shortness of breath within the last 2 weeks of travel OR . Been in close contact with a person diagnosed with COVID-19 within the last 2 weeks and have fever, cough, and shortness of breath . IF YOU DO NOT MEET THESE CRITERIA, YOU ARE CONSIDERED LOW RISK FOR COVID-19.  What to do if you are HIGH RISK for COVID-19?  . If you are having a medical emergency, call 911. . Seek medical care right away. Before you go to a doctor's office, urgent care or emergency department, call ahead and tell them about your recent travel, contact with someone diagnosed with COVID-19, and your symptoms. You should receive instructions from your physician's office regarding next steps of care.  . When you arrive at healthcare provider, tell the healthcare staff immediately you have returned from visiting [place], [place], [place], [place] or [place]; or traveled in the [place] to [place], [place], [place], or [place]; in the last two weeks or you have been in close contact with a person diagnosed with COVID-19 in the last 2 weeks.   . Tell the health care staff about your symptoms: fever, cough and shortness of breath. . After you have been seen by a medical provider, you will be either: o Tested for (COVID-19) and discharged home on quarantine except to seek medical care if symptoms worsen, and asked to  - Stay home and avoid contact with others until you get your results (4-5 days)  - Avoid travel on public transportation if possible (such as bus, train, or airplane) or o Sent to the Emergency Department by EMS for evaluation, COVID-19 testing, and possible  admission depending on your condition and test results.  What to do if you are LOW RISK for COVID-19?  Reduce your risk of any infection by using the same precautions used for avoiding the common cold or flu:  . Wash your hands often with soap and warm water for at least 20 seconds.  If soap and water are not readily available, use an alcohol-based hand sanitizer with at least 60% alcohol.  . If coughing or sneezing, cover your mouth and nose by coughing or sneezing into the elbow areas of your shirt or coat, into a tissue or into your sleeve (not your hands). . Avoid shaking hands with others and consider head nods or verbal greetings only. . Avoid touching your eyes, nose, or mouth with unwashed hands.  . Avoid close contact with people who are sick. . Avoid places or events with large numbers of people in one location, like concerts or sporting events. . Carefully consider travel plans you have or are making. . If you are planning any travel outside or inside the [place], visit the CDC's Travelers' Health webpage for the latest health notices. . If you have some symptoms but not all symptoms, continue to monitor at home and seek medical attention if your symptoms worsen. . If you are having a medical emergency, call 911.   ADDITIONAL HEALTHCARE OPTIONS FOR PATIENTS  [facility] / e-Visit: [email or web address]         [facility]: [phone]  Onset   Urgent Care: [facility]: [phone]   Prescreened per her MOM- Remo Lipps. Neg. cm

## 2019-01-27 ENCOUNTER — Other Ambulatory Visit: Payer: Self-pay

## 2019-01-27 ENCOUNTER — Ambulatory Visit (INDEPENDENT_AMBULATORY_CARE_PROVIDER_SITE_OTHER): Payer: Medicare Other | Admitting: Obstetrics and Gynecology

## 2019-01-27 VITALS — BP 110/72 | HR 81 | Ht 64.0 in | Wt 164.2 lb

## 2019-01-27 DIAGNOSIS — Z3042 Encounter for surveillance of injectable contraceptive: Secondary | ICD-10-CM | POA: Diagnosis not present

## 2019-01-27 MED ORDER — MEDROXYPROGESTERONE ACETATE 150 MG/ML IM SUSP
150.0000 mg | Freq: Once | INTRAMUSCULAR | Status: AC
  Administered 2019-01-27: 150 mg via INTRAMUSCULAR

## 2019-01-31 NOTE — Progress Notes (Signed)
I have reviewed the record and concur with patient management and plan.  Creg Gilmer, MD [facility]     

## 2019-03-17 ENCOUNTER — Ambulatory Visit (INDEPENDENT_AMBULATORY_CARE_PROVIDER_SITE_OTHER): Payer: Medicare Other

## 2019-03-17 ENCOUNTER — Other Ambulatory Visit: Payer: Self-pay

## 2019-03-17 DIAGNOSIS — Z0000 Encounter for general adult medical examination without abnormal findings: Secondary | ICD-10-CM

## 2019-03-17 NOTE — Patient Instructions (Signed)
Ms. Wendy Green , Thank you for taking time to come for your Medicare Wellness Visit. I appreciate your ongoing commitment to your health goals. Please review the following plan we discussed and let me know if I can assist you in the future.   Screening recommendations/referrals: Recommended yearly ophthalmology/optometry visit for glaucoma screening and checkup Recommended yearly dental visit for hygiene and checkup  Vaccinations: Influenza vaccine: Up to date Tdap vaccine: Up to date, due 06/2021   Advanced directives: Currently on file.  Conditions/risks identified: Fall prevention discussed today. Continue to increase water intake to 6-8 8 oz glasses a day.   Next appointment: 03/22/19 @ 1:20 PM with [name].  Preventive Care 40-64 Years, Female Preventive care refers to lifestyle choices and visits with your health care provider that can promote health and wellness. What does preventive care include?  A yearly physical exam. This is also called an annual well check.  Dental exams once or twice a year.  Routine eye exams. Ask your health care provider how often you should have your eyes checked.  Personal lifestyle choices, including:  Daily care of your teeth and gums.  Regular physical activity.  Eating a healthy diet.  Avoiding tobacco and drug use.  Limiting alcohol use.  Practicing safe sex.  Taking low-dose aspirin daily starting at age 70.  Taking vitamin and mineral supplements as recommended by your health care provider. What happens during an annual well check? The services and screenings done by your health care provider during your annual well check will depend on your age, overall health, lifestyle risk factors, and family history of disease. Counseling  Your health care provider may ask you questions about your:  Alcohol use.  Tobacco use.  Drug use.  Emotional well-being.  Home and relationship well-being.  Sexual activity.  Eating  habits.  Work and work Statistician.  Method of birth control.  Menstrual cycle.  Pregnancy history. Screening  You may have the following tests or measurements:  Height, weight, and BMI.  Blood pressure.  Lipid and cholesterol levels. These may be checked every 5 years, or more frequently if you are over 61 years old.  Skin check.  Lung cancer screening. You may have this screening every year starting at age 79 if you have a 30-pack-year history of smoking and currently smoke or have quit within the past 15 years.  Fecal occult blood test (FOBT) of the stool. You may have this test every year starting at age 85.  Flexible sigmoidoscopy or colonoscopy. You may have a sigmoidoscopy every 5 years or a colonoscopy every 10 years starting at age 42.  Hepatitis C blood test.  Hepatitis B blood test.  Sexually transmitted disease (STD) testing.  Diabetes screening. This is done by checking your blood sugar (glucose) after you have not eaten for a while (fasting). You may have this done every 1-3 years.  Mammogram. This may be done every 1-2 years. Talk to your health care provider about when you should start having regular mammograms. This may depend on whether you have a family history of breast cancer.  BRCA-related cancer screening. This may be done if you have a family history of breast, ovarian, tubal, or peritoneal cancers.  Pelvic exam and Pap test. This may be done every 3 years starting at age 8. Starting at age 39, this may be done every 5 years if you have a Pap test in combination with an HPV test.  Bone density scan. This is done to  screen for osteoporosis. You may have this scan if you are at high risk for osteoporosis. Discuss your test results, treatment options, and if necessary, the need for more tests with your health care provider. Vaccines  Your health care provider may recommend certain vaccines, such as:  Influenza vaccine. This is recommended every year.   Tetanus, diphtheria, and acellular pertussis (Tdap, Td) vaccine. You may need a Td booster every 10 years.  Zoster vaccine. You may need this after age 33.  Pneumococcal 13-valent conjugate (PCV13) vaccine. You may need this if you have certain conditions and were not previously vaccinated.  Pneumococcal polysaccharide (PPSV23) vaccine. You may need one or two doses if you smoke cigarettes or if you have certain conditions. Talk to your health care provider about which screenings and vaccines you need and how often you need them. This information is not intended to replace advice given to you by your health care provider. Make sure you discuss any questions you have with your health care provider. Document Released: 10/06/2015 Document Revised: 05/29/2016 Document Reviewed: 07/11/2015 Elsevier Interactive Patient Education  2017 [place] Prevention in the Home Falls can cause injuries. They can happen to people of all ages. There are many things you can do to make your home safe and to help prevent falls. What can I do on the outside of my home?  Regularly fix the edges of walkways and driveways and fix any cracks.  Remove anything that might make you trip as you walk through a door, such as a raised step or threshold.  Trim any bushes or trees on the path to your home.  Use bright outdoor lighting.  Clear any walking paths of anything that might make someone trip, such as rocks or tools.  Regularly check to see if handrails are loose or broken. Make sure that both sides of any steps have handrails.  Any raised decks and porches should have guardrails on the edges.  Have any leaves, snow, or ice cleared regularly.  Use sand or salt on walking paths during winter.  Clean up any spills in your garage right away. This includes oil or grease spills. What can I do in the bathroom?  Use night lights.  Install grab bars by the toilet and in the tub and shower. Do  not use towel bars as grab bars.  Use non-skid mats or decals in the tub or shower.  If you need to sit down in the shower, use a plastic, non-slip stool.  Keep the floor dry. Clean up any water that spills on the floor as soon as it happens.  Remove soap buildup in the tub or shower regularly.  Attach bath mats securely with double-sided non-slip rug tape.  Do not have throw rugs and other things on the floor that can make you trip. What can I do in the bedroom?  Use night lights.  Make sure that you have a light by your bed that is easy to reach.  Do not use any sheets or blankets that are too big for your bed. They should not hang down onto the floor.  Have a firm chair that has side arms. You can use this for support while you get dressed.  Do not have throw rugs and other things on the floor that can make you trip. What can I do in the kitchen?  Clean up any spills right away.  Avoid walking on wet floors.  Keep items that you  use a lot in easy-to-reach places.  If you need to reach something above you, use a strong step stool that has a grab bar.  Keep electrical cords out of the way.  Do not use floor polish or wax that makes floors slippery. If you must use wax, use non-skid floor wax.  Do not have throw rugs and other things on the floor that can make you trip. What can I do with my stairs?  Do not leave any items on the stairs.  Make sure that there are handrails on both sides of the stairs and use them. Fix handrails that are broken or loose. Make sure that handrails are as long as the stairways.  Check any carpeting to make sure that it is firmly attached to the stairs. Fix any carpet that is loose or worn.  Avoid having throw rugs at the top or bottom of the stairs. If you do have throw rugs, attach them to the floor with carpet tape.  Make sure that you have a light switch at the top of the stairs and the bottom of the stairs. If you do not have them,  ask someone to add them for you. What else can I do to help prevent falls?  Wear shoes that:  Do not have high heels.  Have rubber bottoms.  Are comfortable and fit you well.  Are closed at the toe. Do not wear sandals.  If you use a stepladder:  Make sure that it is fully opened. Do not climb a closed stepladder.  Make sure that both sides of the stepladder are locked into place.  Ask someone to hold it for you, if possible.  Clearly mark and make sure that you can see:  Any grab bars or handrails.  First and last steps.  Where the edge of each step is.  Use tools that help you move around (mobility aids) if they are needed. These include:  Canes.  Walkers.  Scooters.  Crutches.  Turn on the lights when you go into a dark area. Replace any light bulbs as soon as they burn out.  Set up your furniture so you have a clear path. Avoid moving your furniture around.  If any of your floors are uneven, fix them.  If there are any pets around you, be aware of where they are.  Review your medicines with your doctor. Some medicines can make you feel dizzy. This can increase your chance of falling. Ask your doctor what other things that you can do to help prevent falls. This information is not intended to replace advice given to you by your health care provider. Make sure you discuss any questions you have with your health care provider. Document Released: 07/06/2009 Document Revised: 02/15/2016 Document Reviewed: 10/14/2014 Elsevier Interactive Patient Education  2017 [employer].

## 2019-03-17 NOTE — Progress Notes (Addendum)
Subjective:   Wendy Green is a 43 y.o. female who presents for Medicare Annual (Subsequent) preventive examination.    This visit is being conducted through telemedicine due to the COVID-19 pandemic. This patient has given me verbal consent via doximity to conduct this visit, patient states they are participating from their home address. Some vital signs may be absent or patient reported.    Patient identification: identified by name, DOB, and current address  Review of Systems:  N/A  Cardiac Risk Factors include: dyslipidemia     Objective:     Vitals: There were no vitals taken for this visit.  There is no height or weight on file to calculate BMI. Unable to obtain vitals due to visit being conducted via telephonically.   Advanced Directives 03/17/2019 01/01/2018 07/16/2016 07/09/2016  Does Patient Have a Medical Advance Directive? Yes No No No  Type of Paramedic of [place];Living will - - (No Data)  Copy of [place] in Chart? Yes - validated most recent copy scanned in chart (See row information) - - -  Would patient like information on creating a medical advance directive? - No - Patient declined No - patient declined information No - patient declined information    Tobacco Social History   Tobacco Use  Smoking Status Never Smoker  Smokeless Tobacco Never Used     Counseling given: Not Answered   Clinical Intake:  Pre-visit preparation completed: Yes  Pain Score: 0-No pain     Nutritional Risks: None Diabetes: No  How often do you need to have someone help you when you read instructions, pamphlets, or other written materials from your doctor or pharmacy?: 1 - Never  Interpreter Needed?: No  Information entered by :: [facility], LPN  Past Medical History:  Diagnosis Date  . Abnormal weight gain 02/24/2015  . Cerebral seizure 02/24/2015   dx at age 89.   . Changeable mood 02/24/2015  . Epilepsy ([place])  05/16/2009  . Episodic mood disorder ([place]) 09/11/2015  . Family history of adverse reaction to anesthesia    mother has nausea and vomiting.  Marland Kitchen Headache, migraine 09/11/2015  . Hematuria 06/04/2016  . High cholesterol   . History of kidney stones   . HLD (hyperlipidemia) 05/16/2009  . Intellectual disability 02/24/2015  . Seizure ([place]) 09/11/2015   Past Surgical History:  Procedure Laterality Date  . CYSTOSCOPY W/ RETROGRADES Bilateral 07/16/2016   Procedure: CYSTOSCOPY WITH RETROGRADE PYELOGRAM;  Surgeon: Hollice Espy, MD;  Location: [facility] ORS;  Service: Urology;  Laterality: Bilateral;  . NO PAST SURGERIES     Family History  Problem Relation Age of Onset  . Heart attack Father   . Stroke Maternal Grandmother   . Pneumonia Maternal Grandfather   . Cancer Paternal Grandfather   . Prostate cancer Neg Hx   . Kidney cancer Neg Hx   . Breast cancer Neg Hx   . Ovarian cancer Neg Hx   . Colon cancer Neg Hx   . Diabetes Neg Hx    Social History   Socioeconomic History  . Marital status: Single    Spouse name: Not on file  . Number of children: 0  . Years of education: Not on file  . Highest education level: 12th grade  Occupational History  . Occupation: none  Social Needs  . Financial resource strain: Not hard at all  . Food insecurity    Worry: Never true    Inability: Never true  .  Transportation needs    Medical: No    Non-medical: No  Tobacco Use  . Smoking status: Never Smoker  . Smokeless tobacco: Never Used  Substance and Sexual Activity  . Alcohol use: No    Alcohol/week: 0.0 standard drinks  . Drug use: No  . Sexual activity: Not Currently    Birth control/protection: None, Injection  Lifestyle  . Physical activity    Days per week: 0 days    Minutes per session: 0 min  . Stress: To some extent  Relationships  . Social Herbalist on phone: Patient refused    Gets together: Patient refused    Attends religious service: Patient refused     Active member of club or organization: Patient refused    Attends meetings of clubs or organizations: Patient refused    Relationship status: Patient refused  Other Topics Concern  . Not on file  Social History Narrative  . Not on file    Outpatient Encounter Medications as of 03/17/2019  Medication Sig  . acetaZOLAMIDE (DIAMOX) 250 MG tablet TAKE 1 TABLET BY MOUTH 3 TIMES A DAY  . fluticasone (FLONASE) 50 MCG/ACT nasal spray Place into both nostrils daily.  . folic acid (FOLVITE) 1 MG tablet TAKE 1 TABLET BY MOUTH ONCE DAILY  . KRILL OIL PO Take 1 capsule by mouth 2 (two) times daily.   . medroxyPROGESTERone (DEPO-PROVERA) 150 MG/ML injection INJECT 1 ML (150 MG TOTAL) INTO THE MUSCLE EVERY 3 (THREE) MONTHS.  Marland Kitchen MULTIPLE VITAMIN PO 1 tablet daily.  . OXcarbazepine (TRILEPTAL) 150 MG tablet Take 1 tablet by mouth 2 (two) times daily.  . primidone (MYSOLINE) 250 MG tablet TAKE 1 TABLET BY MOUTH THREE TIMES DAILY AND 1/2 TABLET IN THE EVENING (Patient taking differently: Take 250 mg by mouth 3 (three) times daily. 1 tablet in AM and PM and 1.5 tablet in afternoon)  . simvastatin (ZOCOR) 20 MG tablet TAKE 1 TABLET BY MOUTH EVERYDAY AT BEDTIME  . Wheat Dextrin (BENEFIBER PO) Take by mouth. As needed  . neomycin-polymyxin-hydrocortisone (CORTISPORIN) OTIC solution 4 drops in the affected ear 3-4 x day (Patient not taking: Reported on 01/27/2019)   No facility-administered encounter medications on file as of 03/17/2019.     Activities of Daily Living In your present state of health, do you have any difficulty performing the following activities: 03/17/2019  Hearing? N  Vision? N  Difficulty concentrating or making decisions? N  Walking or climbing stairs? N  Dressing or bathing? N  Doing errands, shopping? Y  Comment Does not drive.  Preparing Food and eating ? N  Using the Toilet? Y  Comment Mother does cooking.  In the past six months, have you accidently leaked urine? N  Do you have  problems with loss of bowel control? N  Managing your Medications? N  Managing your Finances? Y  Comment Mother manages finances.  Housekeeping or managing your Housekeeping? Y  Comment Mother manages housework.  Some recent data might be hidden    Patient Care Team: Chrismon, Vickki Muff, PA as PCP - General (Physician Assistant) Rubie Maid, MD as Referring Physician (Obstetrics and Gynecology) Anabel Bene, MD as Referring Physician (Neurology) Pa, [facility] as Consulting Physician (Dermatology)    Assessment:   This is a routine wellness examination for Wendy Green.  Exercise Activities and Dietary recommendations Current Exercise Habits: The patient does not participate in regular exercise at present, Exercise limited by: None identified  Goals    .  DIET - INCREASE WATER INTAKE     Recommend increasing water intake to 4-6 glasses a day.     Marland Kitchen LIFESTYLE - DECREASE FALLS RISK     Recommend to remove any items from the home that may cause slips or trips.       Fall Risk: Fall Risk  03/17/2019 08/25/2018 01/01/2018 12/19/2016  Falls in the past year? 1 0 No No  Number falls in past yr: 0 - - -  Injury with Fall? 0 - - -  Follow up Falls prevention discussed - - -    FALL RISK PREVENTION PERTAINING TO THE HOME:  Any stairs in or around the home? Yes  If so, are there any without handrails? No   Home free of loose throw rugs in walkways, pet beds, electrical cords, etc? Yes  Adequate lighting in your home to reduce risk of falls? Yes   ASSISTIVE DEVICES UTILIZED TO PREVENT FALLS:  Life alert? No  Use of a cane, walker or w/c? No  Grab bars in the bathroom? No  Shower chair or bench in shower? No  Elevated toilet seat or a handicapped toilet? No    TIMED UP AND GO:  Was the test performed? No .    Depression Screen PHQ 2/9 Scores 03/17/2019 08/25/2018 01/01/2018 12/19/2016  PHQ - 2 Score 0 0 0 0  PHQ- 9 Score - - - 0     Cognitive Function:  Discussed today.         Immunization History  Administered Date(s) Administered  . Influenza,inj,Quad PF,6+ Mos 06/06/2016, 06/17/2017, 07/29/2018  . Tdap 07/22/2011     Tdap: Up to date  Flu Vaccine: Up to date   Screening Tests Health Maintenance  Topic Date Due  . INFLUENZA VACCINE  04/24/2019  . PAP SMEAR-Modifier  08/09/2019  . TETANUS/TDAP  07/21/2021  . HIV Screening  Completed    Additional Screening:  Vision Screening: Recommended annual ophthalmology exams for early detection of glaucoma and other disorders of the eye.  Dental Screening: Recommended annual dental exams for proper oral hygiene  Community Resource Referral:  CRR required this visit?  No       Plan:  I have personally reviewed and addressed the Medicare Annual Wellness questionnaire and have noted the following in the patient's chart:  A. Medical and social history B. Use of alcohol, tobacco or illicit drugs  C. Current medications and supplements D. Functional ability and status E.  Nutritional status F.  Physical activity G. Advance directives H. List of other physicians I.  Hospitalizations, surgeries, and ER visits in previous 12 months J.  [place] such as hearing and vision if needed, cognitive and depression L. Referrals and appointments   In addition, I have reviewed and discussed with patient certain preventive protocols, quality metrics, and best practice recommendations. A written personalized care plan for preventive services as well as general preventive health recommendations were provided to patient. Nurse Health Advisor  Signed,    Josey Forcier [place], [place]  [id number] Nurse Health Advisor   Nurse Notes: None.  Reviewed Nurse Carrolyn Leigh notes and plan. Agree with documentation and will review with family at the next office visit.

## 2019-03-22 ENCOUNTER — Other Ambulatory Visit: Payer: Self-pay

## 2019-03-22 ENCOUNTER — Encounter: Payer: Self-pay | Admitting: Family Medicine

## 2019-03-22 ENCOUNTER — Ambulatory Visit (INDEPENDENT_AMBULATORY_CARE_PROVIDER_SITE_OTHER): Payer: Medicare Other | Admitting: Family Medicine

## 2019-03-22 VITALS — BP 106/69 | HR 79 | Temp 97.5°F | Wt 165.0 lb

## 2019-03-22 DIAGNOSIS — R4586 Emotional lability: Secondary | ICD-10-CM

## 2019-03-22 DIAGNOSIS — G40309 Generalized idiopathic epilepsy and epileptic syndromes, not intractable, without status epilepticus: Secondary | ICD-10-CM

## 2019-03-22 DIAGNOSIS — E782 Mixed hyperlipidemia: Secondary | ICD-10-CM

## 2019-03-22 DIAGNOSIS — F79 Unspecified intellectual disabilities: Secondary | ICD-10-CM

## 2019-03-22 NOTE — Progress Notes (Signed)
Patient: Wendy Green, Female    DOB: 01/26/1976, 43 y.o.   MRN: [id number] Visit Date: 03/22/2019  Today's Provider: Vernie Murders, PA   Chief Complaint  Patient presents with  . Annual Exam   Subjective:    Annual physical exam Wendy Green is a 43 y.o. female who presents today for health maintenance and complete physical. She feels well. She reports exercising none. She reports she is sleeping well.  ----------------------------------------------------------------- Last Pap: 08/08/2016 Last Mammogram: 02/17/2018  Review of Systems  Constitutional: Negative.   HENT: Positive for ear pain, nosebleeds and sneezing.   Eyes: Negative.   Respiratory: Negative.   Cardiovascular: Negative.   Gastrointestinal: Negative.   Endocrine: Positive for polyuria.  Genitourinary: Positive for frequency, vaginal bleeding, vaginal discharge and vaginal pain.  Musculoskeletal: Positive for arthralgias and back pain.  Skin: Negative.   Allergic/Immunologic: Positive for environmental allergies.  Neurological: Positive for headaches.  Hematological: Negative.   Psychiatric/Behavioral: Negative.     Social History She  reports that she has never smoked. She has never used smokeless tobacco. She reports that she does not drink alcohol or use drugs. Social History   Socioeconomic History  . Marital status: Single    Spouse name: Not on file  . Number of children: 0  . Years of education: Not on file  . Highest education level: 12th grade  Occupational History  . Occupation: none  Social Needs  . Financial resource strain: Not hard at all  . Food insecurity    Worry: Never true    Inability: Never true  . Transportation needs    Medical: No    Non-medical: No  Tobacco Use  . Smoking status: Never Smoker  . Smokeless tobacco: Never Used  Substance and Sexual Activity  . Alcohol use: No    Alcohol/week: 0.0 standard drinks  . Drug use: No  . Sexual  activity: Not Currently    Birth control/protection: None, Injection  Lifestyle  . Physical activity    Days per week: 0 days    Minutes per session: 0 min  . Stress: To some extent  Relationships  . Social Herbalist on phone: Patient refused    Gets together: Patient refused    Attends religious service: Patient refused    Active member of club or organization: Patient refused    Attends meetings of clubs or organizations: Patient refused    Relationship status: Patient refused  Other Topics Concern  . Not on file  Social History Narrative  . Not on file   Patient Active Problem List   Diagnosis Date Noted  . Generalized idiopathic epilepsy, not intractable, without status epilepticus ([place]) 07/23/2017  . Hematuria 06/04/2016  . Headache, migraine 09/11/2015  . Episodic mood disorder ([place]) 09/11/2015  . Seizure ([place]) 09/11/2015  . Intellectual disability 02/24/2015  . Changeable mood 02/24/2015  . Cerebral seizure 02/24/2015  . Abnormal weight gain 02/24/2015  . Epilepsy ([place]) 05/16/2009  . Hyperlipidemia, unspecified 05/16/2009   Past Surgical History:  Procedure Laterality Date  . CYSTOSCOPY W/ RETROGRADES Bilateral 07/16/2016   Procedure: CYSTOSCOPY WITH RETROGRADE PYELOGRAM;  Surgeon: Hollice Espy, MD;  Location: [facility] ORS;  Service: Urology;  Laterality: Bilateral;  . NO PAST SURGERIES     Family History  Family Status  Relation Name Status  . Mother  Alive  . Father  Deceased  . MGM  Deceased  . MGF  Deceased  . PGM  Deceased  . PGF  Deceased  . Neg Hx  (Not Specified)   Her family history includes Cancer in her paternal grandfather; Heart attack in her father; Pneumonia in her maternal grandfather; Stroke in her maternal grandmother.     Allergies  Allergen Reactions  . Aspirin    Previous Medications   ACETAZOLAMIDE (DIAMOX) 250 MG TABLET    TAKE 1 TABLET BY MOUTH 3 TIMES A DAY   FLUTICASONE (FLONASE) 50 MCG/ACT NASAL SPRAY    Place into  both nostrils daily.   FOLIC ACID (FOLVITE) 1 MG TABLET    TAKE 1 TABLET BY MOUTH ONCE DAILY   KRILL OIL PO    Take 1 capsule by mouth 2 (two) times daily.    MEDROXYPROGESTERONE (DEPO-PROVERA) 150 MG/ML INJECTION    INJECT 1 ML (150 MG TOTAL) INTO THE MUSCLE EVERY 3 (THREE) MONTHS.   MULTIPLE VITAMIN PO    1 tablet daily.   NEOMYCIN-POLYMYXIN-HYDROCORTISONE (CORTISPORIN) OTIC SOLUTION    4 drops in the affected ear 3-4 x day   OXCARBAZEPINE (TRILEPTAL) 150 MG TABLET    Take 1 tablet by mouth 2 (two) times daily.   PRIMIDONE (MYSOLINE) 250 MG TABLET    TAKE 1 TABLET BY MOUTH THREE TIMES DAILY AND 1/2 TABLET IN THE EVENING   SIMVASTATIN (ZOCOR) 20 MG TABLET    TAKE 1 TABLET BY MOUTH EVERYDAY AT BEDTIME   WHEAT DEXTRIN (BENEFIBER PO)    Take by mouth. As needed   Patient Care Team: Riley Hallum, Vickki Muff, PA as PCP - General (Physician Assistant) Rubie Maid, MD as Referring Physician (Obstetrics and Gynecology) Anabel Bene, MD as Referring Physician (Neurology) Pa, [facility] as Consulting Physician (Dermatology)     Objective:   Vitals: BP 106/69 (BP Location: Right Arm, Patient Position: Sitting, Cuff Size: Normal)   Pulse 79   Temp (!) 97.5 F (36.4 C) (Oral)   Wt 165 lb (74.8 kg)   SpO2 96%   BMI 28.32 kg/m    Physical Exam Constitutional:      Appearance: She is well-developed.  HENT:     Head: Normocephalic and atraumatic.     Right Ear: External ear normal.     Left Ear: External ear normal.     Nose: Nose normal.  Eyes:     General:        Right eye: No discharge.     Conjunctiva/sclera: Conjunctivae normal.     Pupils: Pupils are equal, round, and reactive to light.  Neck:     Musculoskeletal: Normal range of motion and neck supple.     Thyroid: No thyromegaly.     Trachea: No tracheal deviation.  Cardiovascular:     Rate and Rhythm: Normal rate and regular rhythm.     Heart sounds: Normal heart sounds. No murmur.  Pulmonary:     Effort:  Pulmonary effort is normal. No respiratory distress.     Breath sounds: Normal breath sounds. No wheezing or rales.  Chest:     Chest wall: No tenderness.  Abdominal:     General: There is no distension.     Palpations: Abdomen is soft. There is no mass.     Tenderness: There is no abdominal tenderness. There is no guarding or rebound.  Musculoskeletal: Normal range of motion.        General: No tenderness.  Lymphadenopathy:     Cervical: No cervical adenopathy.  Skin:    General: Skin is warm and dry.  Findings: No erythema or rash.  Neurological:     Mental Status: She is alert and oriented to person, place, and time.     Cranial Nerves: No cranial nerve deficit.     Motor: No abnormal muscle tone.     Coordination: Coordination normal.     Deep Tendon Reflexes: Reflexes are normal and symmetric. Reflexes normal.  Psychiatric:        Behavior: Behavior normal.        Thought Content: Thought content normal.     Comments: Changeable moods stable with discontinuation of decongestants. History of mental disability.     Depression Screen PHQ 2/9 Scores 03/17/2019 08/25/2018 01/01/2018 12/19/2016  PHQ - 2 Score 0 0 0 0  PHQ- 9 Score - - - 0    Assessment & Plan:     Routine Health Maintenance and Physical Exam  Exercise Activities and Dietary recommendations Goals    . DIET - INCREASE WATER INTAKE     Recommend increasing water intake to 4-6 glasses a day.     Marland Kitchen LIFESTYLE - DECREASE FALLS RISK     Recommend to remove any items from the home that may cause slips or trips.       Immunization History  Administered Date(s) Administered  . Influenza,inj,Quad PF,6+ Mos 06/06/2016, 06/17/2017, 07/29/2018  . Tdap 07/22/2011    Health Maintenance  Topic Date Due  . INFLUENZA VACCINE  04/24/2019  . PAP SMEAR-Modifier  08/09/2019  . TETANUS/TDAP  07/21/2021  . HIV Screening  Completed   Discussed health benefits of physical activity, and encouraged her to engage in  regular exercise appropriate for her age and condition.    -------------------------------------------------------------------- 1. Nonintractable generalized idiopathic epilepsy without status epilepticus ([place]) Stable general health screening. Continues annual exam, mammogram and PAP with Dr. Marcelline Mates (GYN). Still on Depo-Provera injections every 3 months with good control of menses. Immunizations up to date. Will get routine labs and follow up of seizure medication blood levels. Still sees Dr. Melrose Nakayama (neurologist) regularly. Tolerating Diamox 250 mg TID, Trileptal 150 mg BID and Primidone 250 mg one tablet TID and 1/2 tablet each evening. No recent significant seizure activity. Mother states Dr. Melrose Nakayama recognized some minor seizure activity at night disrupting sleep but no generalized seizures. - CBC with Differential/Platelet - Comprehensive metabolic panel - [id number] - Phenobarbital level  2. Mixed hyperlipidemia Continues Simvastatin 20 mg qd without side effects. Continue to control weight and follow a low fat diet. Recheck follow up labs. - Comprehensive metabolic panel - Lipid panel - TSH  3. Intellectual disability History of congenital mental retardation and seizure disorder. Lives with mother and enjoys reading books.  - CBC with Differential/Platelet - Comprehensive metabolic panel - TSH  4. Changeable mood History of anxiety and irritability that seems to be much improved when she discontinued use of decongestants. Will check routine labs for metabolic disorders. - CBC with Differential/Platelet - Comprehensive metabolic panel - TSH

## 2019-03-24 DIAGNOSIS — E782 Mixed hyperlipidemia: Secondary | ICD-10-CM | POA: Diagnosis not present

## 2019-03-24 DIAGNOSIS — G40309 Generalized idiopathic epilepsy and epileptic syndromes, not intractable, without status epilepticus: Secondary | ICD-10-CM | POA: Diagnosis not present

## 2019-03-24 DIAGNOSIS — R4586 Emotional lability: Secondary | ICD-10-CM | POA: Diagnosis not present

## 2019-03-24 DIAGNOSIS — F79 Unspecified intellectual disabilities: Secondary | ICD-10-CM | POA: Diagnosis not present

## 2019-03-25 LAB — CBC WITH DIFFERENTIAL/PLATELET
Basophils Absolute: 0.1 10*3/uL (ref 0.0–0.2)
Basos: 1 %
EOS (ABSOLUTE): 0.4 10*3/uL (ref 0.0–0.4)
Eos: 6 %
Hematocrit: 41.8 % (ref 34.0–46.6)
Hemoglobin: 14.1 g/dL (ref 11.1–15.9)
Immature Grans (Abs): 0 10*3/uL (ref 0.0–0.1)
Immature Granulocytes: 0 %
Lymphocytes Absolute: 1.7 10*3/uL (ref 0.7–3.1)
Lymphs: 21 %
MCH: 32.9 pg (ref 26.6–33.0)
MCHC: 33.7 g/dL (ref 31.5–35.7)
MCV: 98 fL — ABNORMAL HIGH (ref 79–97)
Monocytes Absolute: 0.6 10*3/uL (ref 0.1–0.9)
Monocytes: 8 %
Neutrophils Absolute: 5 10*3/uL (ref 1.4–7.0)
Neutrophils: 64 %
Platelets: 222 10*3/uL (ref 150–450)
RBC: 4.28 x10E6/uL (ref 3.77–5.28)
RDW: 12.4 % (ref 11.7–15.4)
WBC: 7.8 10*3/uL (ref 3.4–10.8)

## 2019-03-25 LAB — COMPREHENSIVE METABOLIC PANEL
ALT: 25 IU/L (ref 0–32)
AST: 18 IU/L (ref 0–40)
Albumin/Globulin Ratio: 1.8 (ref 1.2–2.2)
Albumin: 4.4 g/dL (ref 3.8–4.8)
Alkaline Phosphatase: 80 IU/L (ref 39–117)
BUN/Creatinine Ratio: 17 (ref 9–23)
BUN: 10 mg/dL (ref 6–24)
Bilirubin Total: <0.2 mg/dL (ref 0.0–1.2)
CO2: 18 mmol/L — ABNORMAL LOW (ref 20–29)
Calcium: 9.3 mg/dL (ref 8.7–10.2)
Chloride: 109 mmol/L — ABNORMAL HIGH (ref 96–106)
Creatinine, Ser: 0.6 mg/dL (ref 0.57–1.00)
GFR calc Af Amer: 130 mL/min/{1.73_m2} (ref >59)
GFR calc non Af Amer: 113 mL/min/{1.73_m2} (ref >59)
Globulin, Total: 2.5 g/dL (ref 1.5–4.5)
Glucose: 87 mg/dL (ref 65–99)
Potassium: 3.9 mmol/L (ref 3.5–5.2)
Sodium: 140 mmol/L (ref 134–144)
Total Protein: 6.9 g/dL (ref 6.0–8.5)

## 2019-03-25 LAB — 10-HYDROXYCARBAZEPINE: Oxcarbazepine SerPl-Mcnc: 2 ug/mL — ABNORMAL LOW (ref 10–35)

## 2019-03-25 LAB — LIPID PANEL
Chol/HDL Ratio: 3.1 ratio (ref 0.0–4.4)
Cholesterol, Total: 195 mg/dL (ref 100–199)
HDL: 62 mg/dL (ref >39)
LDL Calculated: 109 mg/dL — ABNORMAL HIGH (ref 0–99)
Triglycerides: 118 mg/dL (ref 0–149)
VLDL Cholesterol Cal: 24 mg/dL (ref 5–40)

## 2019-03-25 LAB — TSH: TSH: 3.38 u[IU]/mL (ref 0.450–4.500)

## 2019-03-25 LAB — PHENOBARBITAL LEVEL: Phenobarbital, Serum: 24 ug/mL (ref 15–40)

## 2019-03-29 ENCOUNTER — Telehealth: Payer: Self-pay

## 2019-03-29 NOTE — Telephone Encounter (Signed)
Lab results given to patients mother. She asked that labs be faxed to Neurologist.

## 2019-03-29 NOTE — Telephone Encounter (Signed)
-----   Message from Margo Common, [place] sent at 03/25/2019  6:35 PM EDT ----- Trileptal level is low. Be sure to discuss this with Dr. Melrose Nakayama (neurologist) to see if he wants to adjust dosage. If no seizures, he may leave it as it is.

## 2019-04-06 ENCOUNTER — Telehealth: Payer: Self-pay

## 2019-04-06 NOTE — Telephone Encounter (Signed)
Pt prescreened no symptoms.   Coronavirus (COVID-19) Are you at risk?  Are you at risk for the Coronavirus (COVID-19)?  To be considered HIGH RISK for Coronavirus (COVID-19), you have to meet the following criteria:  . Traveled to [place], [place], [place], [place] or [place]; or in the [place] to [place], [place], [place], or [place]; and have fever, cough, and shortness of breath within the last 2 weeks of travel OR . Been in close contact with a person diagnosed with COVID-19 within the last 2 weeks and have fever, cough, and shortness of breath . IF YOU DO NOT MEET THESE CRITERIA, YOU ARE CONSIDERED LOW RISK FOR COVID-19.  What to do if you are HIGH RISK for COVID-19?  Marland Kitchen If you are having a medical emergency, call 911. . Seek medical care right away. Before you go to a doctor's office, urgent care or emergency department, call ahead and tell them about your recent travel, contact with someone diagnosed with COVID-19, and your symptoms. You should receive instructions from your physician's office regarding next steps of care.  . When you arrive at healthcare provider, tell the healthcare staff immediately you have returned from visiting [place], [place], [place], [place] or [place]; or traveled in the [place] to [place], [place], [place], or [place]; in the last two weeks or you have been in close contact with a person diagnosed with COVID-19 in the last 2 weeks.   . Tell the health care staff about your symptoms: fever, cough and shortness of breath. . After you have been seen by a medical provider, you will be either: o Tested for (COVID-19) and discharged home on quarantine except to seek medical care if symptoms worsen, and asked to  - Stay home and avoid contact with others until you get your results (4-5 days)  - Avoid travel on public transportation if possible (such as bus, train, or airplane) or o Sent to the Emergency Department by EMS for evaluation,  COVID-19 testing, and possible admission depending on your condition and test results.  What to do if you are LOW RISK for COVID-19?  Reduce your risk of any infection by using the same precautions used for avoiding the common cold or flu:  Marland Kitchen Wash your hands often with soap and warm water for at least 20 seconds.  If soap and water are not readily available, use an alcohol-based hand sanitizer with at least 60% alcohol.  . If coughing or sneezing, cover your mouth and nose by coughing or sneezing into the elbow areas of your shirt or coat, into a tissue or into your sleeve (not your hands). . Avoid shaking hands with others and consider head nods or verbal greetings only. . Avoid touching your eyes, nose, or mouth with unwashed hands.  . Avoid close contact with people who are sick. . Avoid places or events with large numbers of people in one location, like concerts or sporting events. . Carefully consider travel plans you have or are making. . If you are planning any travel outside or inside the [place], visit the CDC's Travelers' Health webpage for the latest health notices. . If you have some symptoms but not all symptoms, continue to monitor at home and seek medical attention if your symptoms worsen. . If you are having a medical emergency, call 911.   [place] / e-Visit: [email or web address]         [facility]: [facility]: [facility]: [phone]

## 2019-04-07 ENCOUNTER — Encounter: Payer: Self-pay | Admitting: Obstetrics and Gynecology

## 2019-04-07 ENCOUNTER — Ambulatory Visit (INDEPENDENT_AMBULATORY_CARE_PROVIDER_SITE_OTHER): Payer: Medicare Other | Admitting: Obstetrics and Gynecology

## 2019-04-07 ENCOUNTER — Other Ambulatory Visit: Payer: Self-pay

## 2019-04-07 VITALS — BP 110/78 | HR 80 | Ht 64.0 in | Wt 165.4 lb

## 2019-04-07 DIAGNOSIS — Z3042 Encounter for surveillance of injectable contraceptive: Secondary | ICD-10-CM | POA: Diagnosis not present

## 2019-04-07 DIAGNOSIS — R351 Nocturia: Secondary | ICD-10-CM

## 2019-04-07 DIAGNOSIS — Z1239 Encounter for other screening for malignant neoplasm of breast: Secondary | ICD-10-CM

## 2019-04-07 DIAGNOSIS — Z01419 Encounter for gynecological examination (general) (routine) without abnormal findings: Secondary | ICD-10-CM | POA: Diagnosis not present

## 2019-04-07 DIAGNOSIS — M545 Low back pain, unspecified: Secondary | ICD-10-CM

## 2019-04-07 DIAGNOSIS — E663 Overweight: Secondary | ICD-10-CM

## 2019-04-07 LAB — POCT URINALYSIS DIPSTICK
Bilirubin, UA: NEGATIVE
Blood, UA: NEGATIVE
Glucose, UA: NEGATIVE
Ketones, UA: NEGATIVE
Leukocytes, UA: NEGATIVE
Nitrite, UA: NEGATIVE
Protein, UA: NEGATIVE
Spec Grav, UA: <=1.005 — AB (ref 1.010–1.025)
Urobilinogen, UA: 0.2 E.U./dL
pH, UA: 7 (ref 5.0–8.0)

## 2019-04-07 MED ORDER — MEDROXYPROGESTERONE ACETATE 150 MG/ML IM SUSP
150.0000 mg | Freq: Once | INTRAMUSCULAR | Status: AC
  Administered 2019-04-07: 150 mg via INTRAMUSCULAR

## 2019-04-07 NOTE — Patient Instructions (Signed)
Preventive Care 40-43 Years Old, Female Preventive care refers to visits with your health care provider and lifestyle choices that can promote health and wellness. This includes:  A yearly physical exam. This may also be called an annual well check.  Regular dental visits and eye exams.  Immunizations.  Screening for certain conditions.  Healthy lifestyle choices, such as eating a healthy diet, getting regular exercise, not using drugs or products that contain nicotine and tobacco, and limiting alcohol use. What can I expect for my preventive care visit? Physical exam Your health care provider will check your:  Height and weight. This may be used to calculate body mass index (BMI), which tells if you are at a healthy weight.  Heart rate and blood pressure.  Skin for abnormal spots. Counseling Your health care provider may ask you questions about your:  Alcohol, tobacco, and drug use.  Emotional well-being.  Home and relationship well-being.  Sexual activity.  Eating habits.  Work and work environment.  Method of birth control.  Menstrual cycle.  Pregnancy history. What immunizations do I need?  Influenza (flu) vaccine  This is recommended every year. Tetanus, diphtheria, and pertussis (Tdap) vaccine  You may need a Td booster every 10 years. Varicella (chickenpox) vaccine  You may need this if you have not been vaccinated. Zoster (shingles) vaccine  You may need this after age 60. Measles, mumps, and rubella (MMR) vaccine  You may need at least one dose of MMR if you were born in 1957 or later. You may also need a second dose. Pneumococcal conjugate (PCV13) vaccine  You may need this if you have certain conditions and were not previously vaccinated. Pneumococcal polysaccharide (PPSV23) vaccine  You may need one or two doses if you smoke cigarettes or if you have certain conditions. Meningococcal conjugate (MenACWY) vaccine  You may need this if you  have certain conditions. Hepatitis A vaccine  You may need this if you have certain conditions or if you travel or work in places where you may be exposed to hepatitis A. Hepatitis B vaccine  You may need this if you have certain conditions or if you travel or work in places where you may be exposed to hepatitis B. Haemophilus influenzae type b (Hib) vaccine  You may need this if you have certain conditions. Human papillomavirus (HPV) vaccine  If recommended by your health care provider, you may need three doses over 6 months. You may receive vaccines as individual doses or as more than one vaccine together in one shot (combination vaccines). Talk with your health care provider about the risks and benefits of combination vaccines. What tests do I need? Blood tests  Lipid and cholesterol levels. These may be checked every 5 years, or more frequently if you are over 50 years old.  Hepatitis C test.  Hepatitis B test. Screening  Lung cancer screening. You may have this screening every year starting at age 55 if you have a 30-pack-year history of smoking and currently smoke or have quit within the past 15 years.  Colorectal cancer screening. All adults should have this screening starting at age 50 and continuing until age 75. Your health care provider may recommend screening at age 45 if you are at increased risk. You will have tests every 1-10 years, depending on your results and the type of screening test.  Diabetes screening. This is done by checking your blood sugar (glucose) after you have not eaten for a while (fasting). You may have this   done every 1-3 years.  Mammogram. This may be done every 1-2 years. Talk with your health care provider about when you should start having regular mammograms. This may depend on whether you have a family history of breast cancer.  BRCA-related cancer screening. This may be done if you have a family history of breast, ovarian, tubal, or peritoneal  cancers.  Pelvic exam and Pap test. This may be done every 3 years starting at age 23. Starting at age 86, this may be done every 5 years if you have a Pap test in combination with an HPV test. Other tests  Sexually transmitted disease (STD) testing.  Bone density scan. This is done to screen for osteoporosis. You may have this scan if you are at high risk for osteoporosis. Follow these instructions at home: Eating and drinking  Eat a diet that includes fresh fruits and vegetables, whole grains, lean protein, and low-fat dairy.  Take vitamin and mineral supplements as recommended by your health care provider.  Do not drink alcohol if: ? Your health care provider tells you not to drink. ? You are pregnant, may be pregnant, or are planning to become pregnant.  If you drink alcohol: ? Limit how much you have to 0-1 drink a day. ? Be aware of how much alcohol is in your drink. In the [place] one drink equals one 12 oz bottle of beer (355 mL), one 5 oz glass of wine (148 mL), or one 1 oz glass of hard liquor (44 mL). Lifestyle  Take daily care of your teeth and gums.  Stay active. Exercise for at least 30 minutes on 5 or more days each week.  Do not use any products that contain nicotine or tobacco, such as cigarettes, e-cigarettes, and chewing tobacco. If you need help quitting, ask your health care provider.  If you are sexually active, practice safe sex. Use a condom or other form of birth control (contraception) in order to prevent pregnancy and STIs (sexually transmitted infections).  If told by your health care provider, take low-dose aspirin daily starting at age 27. What's next?  Visit your health care provider once a year for a well check visit.  Ask your health care provider how often you should have your eyes and teeth checked.  Stay up to date on all vaccines. This information is not intended to replace advice given to you by your health care provider. Make sure you  discuss any questions you have with your health care provider. Document Released: 10/06/2015 Document Revised: 05/21/2018 Document Reviewed: 05/21/2018 Elsevier Patient Education  2020 [place] self-awareness is knowing how your breasts look and feel. Doing breast self-awareness is important. It allows you to catch a breast problem early while it is still small and can be treated. All women should do breast self-awareness, including women who have had breast implants. Tell your doctor if you notice a change in your breasts. What you need:  A mirror.  A well-lit room. How to do a breast self-exam A breast self-exam is one way to learn what is normal for your breasts and to check for changes. To do a breast self-exam: Look for changes  1. Take off all the clothes above your waist. 2. Stand in front of a mirror in a room with good lighting. 3. Put your hands on your hips. 4. Push your hands down. 5. Look at your breasts and nipples in the mirror to see if one breast or nipple looks  different from the other. Check to see if: ? The shape of one breast is different. ? The size of one breast is different. ? There are wrinkles, dips, and bumps in one breast and not the other. 6. Look at each breast for changes in the skin, such as: ? Redness. ? Scaly areas. 7. Look for changes in your nipples, such as: ? Liquid around the nipples. ? Bleeding. ? Dimpling. ? Redness. ? A change in where the nipples are. Feel for changes  1. Lie on your back on the floor. 2. Feel each breast. To do this, follow these steps: ? Pick a breast to feel. ? Put the arm closest to that breast above your head. ? Use your other arm to feel the nipple area of your breast. Feel the area with the pads of your three middle fingers by making small circles with your fingers. For the first circle, press lightly. For the second circle, press harder. For the third circle, press even harder.  ? Keep making circles with your fingers at the different pressures as you move down your breast. Stop when you feel your ribs. ? Move your fingers a little toward the center of your body. ? Start making circles with your fingers again, this time going up until you reach your collarbone. ? Keep making up-and-down circles until you reach your armpit. Remember to keep using the three pressures. ? Feel the other breast in the same way. 3. Sit or stand in the tub or shower. 4. With soapy water on your skin, feel each breast the same way you did in step 2 when you were lying on the floor. Write down what you find Writing down what you find can help you remember what to tell your doctor. Write down:  What is normal for each breast.  Any changes you find in each breast, including: ? The kind of changes you find. ? Whether you have pain. ? Size and location of any lumps.  When you last had your menstrual period. General tips  Check your breasts every month.  If you are breastfeeding, the best time to check your breasts is after you feed your baby or after you use a breast pump.  If you get menstrual periods, the best time to check your breasts is 5-7 days after your menstrual period is over.  With time, you will become comfortable with the self-exam, and you will begin to know if there are changes in your breasts. Contact a doctor if you:  See a change in the shape or size of your breasts or nipples.  See a change in the skin of your breast or nipples, such as red or scaly skin.  Have fluid coming from your nipples that is not normal.  Find a lump or thick area that was not there before.  Have pain in your breasts.  Have any concerns about your breast health. Summary  Breast self-awareness includes looking for changes in your breasts, as well as feeling for changes within your breasts.  Breast self-awareness should be done in front of a mirror in a well-lit room.  You should  check your breasts every month. If you get menstrual periods, the best time to check your breasts is 5-7 days after your menstrual period is over.  Let your doctor know of any changes you see in your breasts, including changes in size, changes on the skin, pain or tenderness, or fluid from your nipples that is not normal. This  information is not intended to replace advice given to you by your health care provider. Make sure you discuss any questions you have with your health care provider. Document Released: 02/26/2008 Document Revised: 04/28/2018 Document Reviewed: 04/28/2018 Elsevier Patient Education  2020 [employer].

## 2019-04-07 NOTE — Progress Notes (Signed)
Pt is present for annual exam and depo injection. Last depo 01/27/19. Pt's next depo injection Sept 30-Jul 07, 2019. Pt denies any issues. Pt stated that she was doing well.

## 2019-04-07 NOTE — Progress Notes (Signed)
GYNECOLOGY ANNUAL PHYSICAL EXAM PROGRESS NOTE  Subjective:    Wendy Green is a 43 y.o. [place] female with mild mental delay who presents for an annual exam.  The patient has never been sexually active.  The patient wears seatbelts: yes. The patient participates in regular exercise: no. Has the patient ever been transfused or tattooed?: no. The patient reports that there is not domestic violence in her life.    The patient has the following complaints today:  1. Patient reports back pain since yesterday.  Per her mother, she and patient were crawling on the ground yesterday for quite a while in their home trying to find a lost item. She notes patient has complained of back pain in the past, was worked up ~ 3 years ago for kidney stones with no significant findings, however incidental finding of a bulging disc in the back was noted on a CT scan.  2. Patient's mother notes that for the past several nights, patient has gotten up in the middle of the night to urinate, which is unusual for her.  Patient denies hematuria or dysuria.  She admits that she does drink a lot of tea and not enough water.   Gynecologic History  Menarche age: 30 No LMP recorded. Patient has had an injection. Contraception: Depo-Provera injections (for management of heavy menses) History of STI's: Denies. Last Pap: 07/2016. Results were: normal.  Denies h/o abnormal pap smears. Last mammogram: 02/17/2018. Results were: normal   OB History  Gravida Para Term Preterm AB Living  0 0 0 0 0 0  SAB TAB Ectopic Multiple Live Births  0 0 0 0 0    Past Medical History:  Diagnosis Date  . Abnormal weight gain 02/24/2015  . Cerebral seizure 02/24/2015   dx at age 71.   . Changeable mood 02/24/2015  . Epilepsy ([place]) 05/16/2009  . Episodic mood disorder ([place]) 09/11/2015  . Family history of adverse reaction to anesthesia    mother has nausea and vomiting.  Marland Kitchen Headache, migraine 09/11/2015  . Hematuria 06/04/2016  . High  cholesterol   . History of kidney stones   . HLD (hyperlipidemia) 05/16/2009  . Intellectual disability 02/24/2015  . Seizure ([place]) 09/11/2015    Past Surgical History:  Procedure Laterality Date  . CYSTOSCOPY W/ RETROGRADES Bilateral 07/16/2016   Procedure: CYSTOSCOPY WITH RETROGRADE PYELOGRAM;  Surgeon: Hollice Espy, MD;  Location: [facility] ORS;  Service: Urology;  Laterality: Bilateral;  . NO PAST SURGERIES      Family History  Problem Relation Age of Onset  . Heart attack Father   . Stroke Maternal Grandmother   . Pneumonia Maternal Grandfather   . Cancer Paternal Grandfather   . Prostate cancer Neg Hx   . Kidney cancer Neg Hx   . Breast cancer Neg Hx   . Ovarian cancer Neg Hx   . Colon cancer Neg Hx   . Diabetes Neg Hx     Social History   Socioeconomic History  . Marital status: Single    Spouse name: Not on file  . Number of children: 0  . Years of education: Not on file  . Highest education level: 12th grade  Occupational History  . Occupation: none  Social Needs  . Financial resource strain: Not hard at all  . Food insecurity    Worry: Never true    Inability: Never true  . Transportation needs    Medical: No    Non-medical: No  Tobacco Use  .  Smoking status: Never Smoker  . Smokeless tobacco: Never Used  Substance and Sexual Activity  . Alcohol use: No    Alcohol/week: 0.0 standard drinks  . Drug use: No  . Sexual activity: Not Currently    Birth control/protection: None, Injection  Lifestyle  . Physical activity    Days per week: 0 days    Minutes per session: 0 min  . Stress: To some extent  Relationships  . Social Herbalist on phone: Patient refused    Gets together: Patient refused    Attends religious service: Patient refused    Active member of club or organization: Patient refused    Attends meetings of clubs or organizations: Patient refused    Relationship status: Patient refused  . Intimate partner violence    Fear of  current or ex partner: Patient refused    Emotionally abused: Patient refused    Physically abused: Patient refused    Forced sexual activity: Patient refused  Other Topics Concern  . Not on file  Social History Narrative  . Not on file    Current Outpatient Medications on File Prior to Visit  Medication Sig Dispense Refill  . acetaZOLAMIDE (DIAMOX) 250 MG tablet TAKE 1 TABLET BY MOUTH 3 TIMES A DAY 90 tablet 3  . fluticasone (FLONASE) 50 MCG/ACT nasal spray Place into both nostrils daily.    . folic acid (FOLVITE) 1 MG tablet TAKE 1 TABLET BY MOUTH ONCE DAILY    . KRILL OIL PO Take 1 capsule by mouth 2 (two) times daily.     . medroxyPROGESTERone (DEPO-PROVERA) 150 MG/ML injection INJECT 1 ML (150 MG TOTAL) INTO THE MUSCLE EVERY 3 (THREE) MONTHS. 1 mL 3  . MULTIPLE VITAMIN PO 1 tablet daily.    Marland Kitchen neomycin-polymyxin-hydrocortisone (CORTISPORIN) OTIC solution 4 drops in the affected ear 3-4 x day (Patient not taking: Reported on 01/27/2019) 10 mL 0  . OXcarbazepine (TRILEPTAL) 150 MG tablet Take 1 tablet by mouth 2 (two) times daily.    . primidone (MYSOLINE) 250 MG tablet TAKE 1 TABLET BY MOUTH THREE TIMES DAILY AND 1/2 TABLET IN THE EVENING (Patient taking differently: Take 250 mg by mouth 3 (three) times daily. 1 tablet in AM and PM and 1.5 tablet in afternoon) 100 tablet 3  . simvastatin (ZOCOR) 20 MG tablet TAKE 1 TABLET BY MOUTH EVERYDAY AT BEDTIME 90 tablet 2  . Wheat Dextrin (BENEFIBER PO) Take by mouth. As needed     No current facility-administered medications on file prior to visit.     Allergies  Allergen Reactions  . Aspirin      Review of Systems Constitutional: negative for chills, fatigue, fevers and sweats Eyes: negative for irritation, redness and visual disturbance Ears, nose, mouth, throat, and face: negative for hearing loss, nasal congestion, snoring and tinnitus Respiratory: negative for asthma, cough, sputum Cardiovascular: negative for chest pain, dyspnea,  exertional chest pressure/discomfort, irregular heart beat, palpitations and syncope Gastrointestinal: negative for abdominal pain, change in bowel habits, nausea and vomiting Genitourinary: negative for abnormal menstrual periods, genital lesions, sexual problems and vaginal discharge, dysuria and urinary incontinence Integument/breast: negative for breast lump, breast tenderness and nipple discharge Hematologic/lymphatic: negative for bleeding and easy bruising Musculoskeletal: positive for back pain (x 1 day), see HPI.  Negative for muscle weakness Neurological: negative for dizziness, headaches, vertigo and weakness Endocrine: negative for diabetic symptoms including polydipsia, polyuria and skin dryness Allergic/Immunologic: negative for hay fever and urticaria  Objective:  Blood pressure 110/78, pulse 80, height 5\' 4"  (1.626 m), weight 165 lb 6.4 oz (75 kg). Body mass index is 28.39 kg/m.  General Appearance:    Alert, cooperative, no distress, appears stated age, overweight  Head:    Normocephalic, without obvious abnormality, atraumatic  Eyes:    PERRL, conjunctiva/corneas clear, EOM's intact, both eyes  Ears:    Normal external ear canals, both ears  Nose:   Nares normal, septum midline, mucosa normal, no drainage or sinus tenderness  Throat:   Lips, mucosa, and tongue normal; teeth and gums normal  Neck:   Supple, symmetrical, trachea midline, no adenopathy; thyroid: no enlargement/tenderness/nodules; no carotid bruit or JVD  Back:     Symmetric, no curvature, ROM normal, no CVA tenderness  Lungs:     Clear to auscultation bilaterally, respirations unlabored  Chest Wall:    No tenderness or deformity   Heart:    Regular rate and rhythm, S1 and S2 normal, no murmur, rub or gallop  Breast Exam:    No tenderness, masses, or nipple abnormality  Abdomen:     Soft, non-tender, bowel sounds active all four quadrants, no masses, no organomegaly.    Genitalia:    Pelvic:external  genitalia normal, vagina without lesions, discharge, or tenderness, rectovaginal septum  normal. Cervix normal in appearance, no cervical motion tenderness, no adnexal masses or tenderness.  Uterus normal size, shape, mobile, regular contours, nontender.  Rectal:    Normal external sphincter.  No hemorrhoids appreciated. Internal exam not done.   Extremities:   Extremities normal, atraumatic, no cyanosis or edema  Pulses:   2+ and symmetric all extremities  Skin:   Skin color, texture, turgor normal, no rashes or lesions  Lymph nodes:   Cervical, supraclavicular, and axillary nodes normal  Neurologic:   CNII-XII intact, normal strength, sensation and reflexes throughout   .  Labs:  Lab Results  Component Value Date   WBC 7.8 03/24/2019   HGB 14.1 03/24/2019   HCT 41.8 03/24/2019   MCV 98 (H) 03/24/2019   PLT 222 03/24/2019    Lab Results  Component Value Date   CREATININE 0.60 03/24/2019   BUN 10 03/24/2019   NA 140 03/24/2019   K 3.9 03/24/2019   CL 109 (H) 03/24/2019   CO2 18 (L) 03/24/2019    Lab Results  Component Value Date   ALT 25 03/24/2019   AST 18 03/24/2019   ALKPHOS 80 03/24/2019   BILITOT <0.2 03/24/2019    Lab Results  Component Value Date   TSH 3.380 03/24/2019   Lab Results  Component Value Date   CHOL 195 03/24/2019   HDL 62 03/24/2019   LDLCALC 109 (H) 03/24/2019   TRIG 118 03/24/2019   CHOLHDL 3.1 03/24/2019     Assessment:   Encounter for well woman exam with routine gynecological exam  Encounter for management and injection of depo-Provera  Screening for breast cancer Nocturia Overweight (BMI 25.0-29.9) Acute midline low back pain without sciatica    Plan:    - Blood tests: None ordered.  Recent labs reviewed. . - Breast self exam technique reviewed and patient encouraged to perform self-exam monthly. - Pap smear: not performed. Last screening done in 2017. As patient has never been sexually active, is at very low risk of cervical  cancer. Can after 5 years.  - Contraception: Depo-Provera injections.  Has been using for 1.5 years. Advised that after 2 years, if continued use is desired, will need to  have Dexa scan to assess bone density due to risk of reversible bone loss. Injection given today.  - Discussed healthy lifestyle modifications. - Nocturia noted for several nights. Attempted to get urine specimen however patient had difficulties with collection. Sent home with urinary hat and sample cup. - Can return at a later time. Also advised to decrease caffeine and increase water intake.  - Back pain, acute. Likely secondary to activity yesterday. Can take Tylenol/Ibuprofen, ice or heating pads as needed.  If no improvement, can f/u with PCP.  - Follow up in 1 year for annual exam, in 3 months for next Depo Provera injection. Rubie Maid, MD [facility]

## 2019-04-07 NOTE — Progress Notes (Signed)
Subjective:    Wendy Green is a 43 y.o. female who presents for an annual exam. The patient has never been sexually active, periods well controlled on Depo. She has been staying home and avoiding Covid. Eats pretty healthy at home, though a bit of burgers and sloppy joes. Walk when shopping but no exercise. She has some nighttime back pain- CT showed bulging disc, no f/u with specialist so far. For Periods, she is on Depo- spots off and on, but no actual periods. She has her labs done by her PCP. Seizures- have been well controlled on medications. Last seizure was years ago. Followed by neurology for this. Pt lives with mom - just the two of them, dad passed 5 years ago.   Menstrual History: OB History    Gravida  0   Para  0   Term  0   Preterm  0   AB  0   Living  0     SAB  0   TAB  0   Ectopic  0   Multiple  0   Live Births  0           Menarche age: unknown No LMP recorded. Patient has had an injection.  Last pap with HPV cotesting was 07/2016 and was normal, repeat in 5 years     Review of Systems Pertinent items noted in HPI and remainder of comprehensive ROS otherwise negative.   Objective CONSTITUTIONAL: Well-developed, well-nourished female in no acute distress.  PSYCHIATRIC: Normal mood and affect. Normal behavior. Normal judgment and thought content. [place]: Alert and oriented to person, place, and time. Normal muscle tone coordination. No cranial nerve deficit noted. Intellectual disability, unchanged.  HENT:  Normocephalic, atraumatic, External right and left ear normal. Oropharynx is clear and moist EYES: Conjunctivae and EOM are normal. Pupils are equal, round, no scleral icterus NECK: Normal range of motion, supple, no masses.  Normal thyroid.  SKIN: Skin is warm and dry. No rash noted. Not diaphoretic. No erythema. No pallor. CARDIOVASCULAR: Normal heart rate noted, regular rhythm, no murmur. RESPIRATORY: Clear to auscultation  bilaterally. Effort and breath sounds normal, no problems with respiration noted. BREASTS: Symmetric in size. No masses, skin changes, nipple drainage, or lymphadenopathy. ABDOMEN: Soft, normal bowel sounds, no distention noted.  No tenderness, rebound or guarding.  BLADDER: Normal PELVIC:  Bladder no bladder distension noted  Urethra:  normal appearing urethra with no masses, tenderness or lesions  Vulva: normal appearing vulva with no masses, tenderness or lesions  Vagina: not indicated and normal appearing vagina with normal color and discharge, no lesions  Cervix: not indicated and normal appearing cervix without discharge or lesions  Uterus: not indicated and uterus is normal size, shape, consistency and nontender  Adnexa: not indicated and normal adnexa in size, nontender and no masses  RV: External Exam NormaI, No Rectal Masses and Normal Sphincter tone  MUSCULOSKELETAL: Normal range of motion. No tenderness.  No cyanosis, clubbing, or edema.  2+ distal pulses.  LYMPHATIC: No Axillary, Supraclavicular, or Inguinal Adenopathy.    Assessment and plan:   Annual gynecologic examination  Exam normal, no issues. Discussed importance of healthy diet and exercise. Continue to also follow with PCP for routine health management. Immunizations UTD. F/u in 1 year or as needed. Last pap with HPV cotesting was 07/2016 and was normal, repeat in 5 years   Back pain CT 06/2016 showed L3 limbus vertebra. Scattered lumbar spondylosis and degenerative disc disease including a right paracentral  disc protrusion at the T12-L1 level. Patient continues to experience nighttime back pain. If she continues to experience this discomfort, may consider referral to pain or spine specialist.   Mixed hyperlipidemia Continue Simvastatin 20 mg qd as directed. Continue to control weight and follow a low fat diet. Continue to follow with PCP for lipid panel.  Intellectual disability Unchanged. History of congenital  mental retardation.  Nonintractable generalized idiopathic epilepsy without status epilepticus ([place]) History of seizure disorder, well controlled on current regimen. Also follows with neurology- Dr. Melrose Nakayama.   Contraception: Depo-Provera injections Continue these every 3 months- next due Sept 30-Jul 07, 2019. Pt has spotting but no periods on this. Patient has never been sexually active.   Return to [place] or as needed    Marin Roberts, PA-S 04/07/19

## 2019-06-21 ENCOUNTER — Other Ambulatory Visit: Payer: Self-pay | Admitting: Obstetrics and Gynecology

## 2019-06-22 ENCOUNTER — Ambulatory Visit (INDEPENDENT_AMBULATORY_CARE_PROVIDER_SITE_OTHER): Payer: Medicare Other

## 2019-06-22 ENCOUNTER — Other Ambulatory Visit: Payer: Self-pay

## 2019-06-22 DIAGNOSIS — Z23 Encounter for immunization: Secondary | ICD-10-CM

## 2019-06-24 ENCOUNTER — Other Ambulatory Visit: Payer: Self-pay

## 2019-06-24 ENCOUNTER — Ambulatory Visit (INDEPENDENT_AMBULATORY_CARE_PROVIDER_SITE_OTHER): Payer: Medicare Other | Admitting: Obstetrics and Gynecology

## 2019-06-24 VITALS — BP 135/81 | HR 86 | Ht 64.0 in | Wt 165.0 lb

## 2019-06-24 DIAGNOSIS — Z3042 Encounter for surveillance of injectable contraceptive: Secondary | ICD-10-CM

## 2019-06-24 MED ORDER — MEDROXYPROGESTERONE ACETATE 150 MG/ML IM SUSP
150.0000 mg | Freq: Once | INTRAMUSCULAR | Status: AC
  Administered 2019-06-24: 11:00:00 150 mg via INTRAMUSCULAR

## 2019-06-24 NOTE — Progress Notes (Signed)
Date last pap: 11/09/18. Last Depo-Provera: 01/27/19. Side Effects if any: none. Serum HCG indicated? n/a. Depo-Provera 150 mg IM given by: FHampton,LPN. Next appointment due: Dec 17-31, 2020.   BP 135/81   Pulse 86   Ht 5\' 4"  (1.626 m)   Wt 165 lb (74.8 kg)   BMI 28.32 kg/m

## 2019-06-25 ENCOUNTER — Ambulatory Visit: Payer: Medicare Other

## 2019-08-18 ENCOUNTER — Ambulatory Visit
Admission: RE | Admit: 2019-08-18 | Discharge: 2019-08-18 | Disposition: A | Discharge status: Home / Self Care | Payer: Medicare Other | Source: Ambulatory Visit | Attending: Obstetrics and Gynecology | Admitting: Obstetrics and Gynecology

## 2019-08-18 DIAGNOSIS — Z1239 Encounter for other screening for malignant neoplasm of breast: Secondary | ICD-10-CM

## 2019-08-18 DIAGNOSIS — Z1231 Encounter for screening mammogram for malignant neoplasm of breast: Secondary | ICD-10-CM | POA: Diagnosis not present

## 2019-09-01 DIAGNOSIS — R569 Unspecified convulsions: Secondary | ICD-10-CM | POA: Diagnosis not present

## 2019-09-09 ENCOUNTER — Other Ambulatory Visit: Payer: Self-pay | Admitting: Family Medicine

## 2019-09-09 DIAGNOSIS — E782 Mixed hyperlipidemia: Secondary | ICD-10-CM

## 2019-09-10 ENCOUNTER — Ambulatory Visit (INDEPENDENT_AMBULATORY_CARE_PROVIDER_SITE_OTHER): Payer: Medicare Other | Admitting: Obstetrics and Gynecology

## 2019-09-10 ENCOUNTER — Other Ambulatory Visit: Payer: Self-pay

## 2019-09-10 VITALS — BP 106/73 | HR 83 | Ht 64.0 in | Wt 167.0 lb

## 2019-09-10 DIAGNOSIS — Z3042 Encounter for surveillance of injectable contraceptive: Secondary | ICD-10-CM | POA: Diagnosis not present

## 2019-09-10 MED ORDER — MEDROXYPROGESTERONE ACETATE 150 MG/ML IM SUSP
150.0000 mg | Freq: Once | INTRAMUSCULAR | Status: AC
  Administered 2019-09-10: 150 mg via INTRAMUSCULAR

## 2019-09-10 NOTE — Progress Notes (Signed)
Date last pap: NA Last Depo-Provera: 06/24/2019 Side Effects if any: NA Serum HCG indicated? NA Depo-Provera 150 mg IM given by: J. [facility] CMA Next appointment due 11/26/2019-12/10/2019   Today's Vitals   09/10/19 1106  BP: 106/73  Pulse: 83  Weight: 167 lb (75.8 kg)   Body mass index is 28.67 kg/m.

## 2019-10-05 ENCOUNTER — Telehealth: Payer: Self-pay

## 2019-10-05 ENCOUNTER — Telehealth: Payer: Self-pay | Admitting: Obstetrics and Gynecology

## 2019-10-05 DIAGNOSIS — Z793 Long term (current) use of hormonal contraceptives: Secondary | ICD-10-CM

## 2019-10-05 DIAGNOSIS — Z3042 Encounter for surveillance of injectable contraceptive: Secondary | ICD-10-CM

## 2019-10-05 NOTE — Telephone Encounter (Signed)
Advised patient's mother Remo Lipps) and she agrees with [place]. She states that she is waiting for the OBGYN to contact her before having [facility] place the order.FYI

## 2019-10-05 NOTE — Telephone Encounter (Signed)
pt's mom called in the pt's PCP told her to call us for a bone density test. pt's mom said doctor cherry told her that her PCP could do the test but they advised her to call us since she has care here with [place]. Please advise

## 2019-10-05 NOTE — Telephone Encounter (Signed)
That's fine. We can order it. I have placed the order. Someone should contact her in the next week or two. If she does not hear from someone, she should call us back so we can check in on it.   Dr. Marcelline Mates

## 2019-10-05 NOTE — Telephone Encounter (Signed)
I don't understand why OBGYN can't place an order for an image that they are requesting for an issue that they are managing. I can place it, but we have not been managing her birth control. I will need to create an encounter and charge an office visit so there is proper documentation of my assessment. for medicare to pay for it.

## 2019-10-05 NOTE — Telephone Encounter (Signed)
If it is for an issue they are managing then they need to order it. If they will not, she will need to schedule a virtual or telephone visit.

## 2019-10-05 NOTE — Telephone Encounter (Signed)
Spoke with patient's mother Wendy Green) and she states that the nurse at the [facility] office stated that the referral will have to be placed by the patients PCP. They are requesting the referral due to been on depo and patient can started having bone loss. Wendy Green states she will give the OBGYN a call back and advise them and see why they can not place the referral and call back.FYI

## 2019-10-05 NOTE — Telephone Encounter (Signed)
Copied from [place] [id number]. Topic: Referral - Request for Referral >> Oct 05, 2019  8:37 AM Alanda Slim E wrote: Has patient seen PCP for this complaint?  *If NO, is insurance requiring patient see PCP for this issue before PCP can refer them? Referral for which specialty: Bone desity scan  Preferred provider/office:  Reason for referral: Pt gets Depo shot with her OBGYN and they are needing Pt to have a bone density scan before her next appt on March 15th/ please advise

## 2019-10-05 NOTE — Telephone Encounter (Signed)
Dr. Cherry, Please advise. Thanks Royelle Hinchman 

## 2019-10-07 ENCOUNTER — Telehealth: Payer: Self-pay | Admitting: Obstetrics and Gynecology

## 2019-10-07 NOTE — Telephone Encounter (Signed)
Pt's mother is aware that her order has been place and to allow a week or 2 to receive a call from them to schedule an appointment.

## 2019-10-07 NOTE — Telephone Encounter (Signed)
pts mom called in and the pt is needs  A bone density test before her next appointment pt was told to call us. Pts moms is requesting a call back from the nurse

## 2019-10-15 ENCOUNTER — Other Ambulatory Visit: Payer: Self-pay | Admitting: Family Medicine

## 2019-10-15 DIAGNOSIS — E782 Mixed hyperlipidemia: Secondary | ICD-10-CM

## 2019-10-29 DIAGNOSIS — D2272 Melanocytic nevi of left lower limb, including hip: Secondary | ICD-10-CM | POA: Diagnosis not present

## 2019-10-29 DIAGNOSIS — L821 Other seborrheic keratosis: Secondary | ICD-10-CM | POA: Diagnosis not present

## 2019-10-29 DIAGNOSIS — D225 Melanocytic nevi of trunk: Secondary | ICD-10-CM | POA: Diagnosis not present

## 2019-10-29 DIAGNOSIS — D2261 Melanocytic nevi of right upper limb, including shoulder: Secondary | ICD-10-CM | POA: Diagnosis not present

## 2019-10-29 DIAGNOSIS — D2262 Melanocytic nevi of left upper limb, including shoulder: Secondary | ICD-10-CM | POA: Diagnosis not present

## 2019-10-29 DIAGNOSIS — D2271 Melanocytic nevi of right lower limb, including hip: Secondary | ICD-10-CM | POA: Diagnosis not present

## 2019-11-10 ENCOUNTER — Ambulatory Visit
Admission: RE | Admit: 2019-11-10 | Discharge: 2019-11-10 | Disposition: A | Discharge status: Home / Self Care | Payer: Medicare Other | Source: Ambulatory Visit | Attending: Obstetrics and Gynecology | Admitting: Obstetrics and Gynecology

## 2019-11-10 DIAGNOSIS — Z793 Long term (current) use of hormonal contraceptives: Secondary | ICD-10-CM | POA: Diagnosis not present

## 2019-11-10 DIAGNOSIS — Z13820 Encounter for screening for osteoporosis: Secondary | ICD-10-CM | POA: Diagnosis not present

## 2019-12-06 ENCOUNTER — Other Ambulatory Visit: Payer: Self-pay

## 2019-12-06 ENCOUNTER — Ambulatory Visit (INDEPENDENT_AMBULATORY_CARE_PROVIDER_SITE_OTHER): Payer: Medicare Other | Admitting: Obstetrics and Gynecology

## 2019-12-06 VITALS — BP 94/64 | HR 77 | Ht 64.0 in | Wt 166.0 lb

## 2019-12-06 DIAGNOSIS — Z3042 Encounter for surveillance of injectable contraceptive: Secondary | ICD-10-CM

## 2019-12-06 MED ORDER — MEDROXYPROGESTERONE ACETATE 150 MG/ML IM SUSP
150.0000 mg | Freq: Once | INTRAMUSCULAR | Status: AC
  Administered 2019-12-06: 150 mg via INTRAMUSCULAR

## 2019-12-06 NOTE — Progress Notes (Signed)
Date last pap: 08/08/2016 Last Depo-Provera: 09/10/19 . Side Effects if any: none. Serum HCG indicated? n/a. Depo-Provera 150 mg IM given by: FHampton, LPN. Next appointment due May 31-March 06, 2020    BP 94/64   Pulse 77   Ht 5\' 4"  (1.626 m)   Wt 166 lb (75.3 kg)   BMI 28.49 kg/m

## 2019-12-13 ENCOUNTER — Telehealth: Payer: Self-pay | Admitting: Obstetrics and Gynecology

## 2019-12-13 NOTE — Telephone Encounter (Signed)
Pt's mom called in and stated that she was returning a call for the nurse. The mom is requesting a call back.

## 2019-12-16 NOTE — Telephone Encounter (Signed)
Went over test results with pt per [facility] about bone density screening results.

## 2020-02-15 ENCOUNTER — Other Ambulatory Visit: Payer: Self-pay | Admitting: Family Medicine

## 2020-02-15 DIAGNOSIS — E782 Mixed hyperlipidemia: Secondary | ICD-10-CM

## 2020-02-15 MED ORDER — SIMVASTATIN 20 MG PO TABS: ORAL_TABLET | ORAL | 3 refills | Status: DC

## 2020-03-01 ENCOUNTER — Ambulatory Visit (INDEPENDENT_AMBULATORY_CARE_PROVIDER_SITE_OTHER): Payer: Medicare Other | Admitting: Surgical

## 2020-03-01 ENCOUNTER — Other Ambulatory Visit: Payer: Self-pay

## 2020-03-01 DIAGNOSIS — Z3042 Encounter for surveillance of injectable contraceptive: Secondary | ICD-10-CM

## 2020-03-01 MED ORDER — MEDROXYPROGESTERONE ACETATE 150 MG/ML IM SUSP
150.0000 mg | Freq: Once | INTRAMUSCULAR | Status: AC
  Administered 2020-03-01: 150 mg via INTRAMUSCULAR

## 2020-03-01 NOTE — Progress Notes (Signed)
Date last pap: NA  Last Depo-Provera: 12/06/19 Side Effects if any: NA Serum HCG indicated? NA Depo-Provera 150 mg IM given by: J. [facility] CMA  Next appointment due  05/17/20-05/31/20

## 2020-03-16 ENCOUNTER — Telehealth: Payer: Self-pay

## 2020-03-16 NOTE — Telephone Encounter (Signed)
Copied from [place] [id number]. Topic: Appointment Scheduling - Scheduling Inquiry for Clinic >> Mar 16, 2020  2:01 PM Sheran Luz wrote: Patient's mother calling to schedule CPE with [name]. Unable to schedule through [facility], as it will only allow AWV with health advisor.

## 2020-03-16 NOTE — Progress Notes (Addendum)
Subjective:   Wendy Green is a 44 y.o. female who presents for Medicare Annual (Subsequent) preventive examination.  I connected with Tawni Levy today by telephone and verified that I am speaking with the correct person using two identifiers. Location patient: home Location provider: work Persons participating in the virtual visit: patient, provider.   I discussed the limitations, risks, security and privacy concerns of performing an evaluation and management service by telephone and the availability of in person appointments. I also discussed with the patient that there may be a patient responsible charge related to this service. The patient expressed understanding and verbally consented to this telephonic visit.    Interactive audio and video telecommunications were attempted between this provider and patient, however failed, due to patient having technical difficulties OR patient did not have access to video capability.  We continued and completed visit with audio only.  Review of Systems    N/A  Cardiac Risk Factors include: dyslipidemia     Objective:    There were no vitals filed for this visit. There is no height or weight on file to calculate BMI.  Advanced Directives 03/20/2020 03/17/2019 01/01/2018 07/16/2016 07/09/2016  Does Patient Have a Medical Advance Directive? Yes Yes No No No  Type of Paramedic of [place];Living will [place];Living will - - (No Data)  Copy of [place] in Chart? Yes - validated most recent copy scanned in chart (See row information) Yes - validated most recent copy scanned in chart (See row information) - - -  Would patient like information on creating a medical advance directive? - - No - Patient declined No - patient declined information No - patient declined information    Current Medications (verified) Outpatient Encounter Medications as of 03/20/2020  Medication Sig     acetaminophen (TYLENOL) 500 MG tablet Take 500 mg by mouth every 6 (six) hours as needed.   acetaZOLAMIDE (DIAMOX) 250 MG tablet TAKE 1 TABLET BY MOUTH 3 TIMES A DAY   fluticasone (FLONASE) 50 MCG/ACT nasal spray Place into both nostrils daily.   folic acid (FOLVITE) 1 MG tablet TAKE 1 TABLET BY MOUTH ONCE DAILY   KRILL OIL PO Take 1 capsule by mouth 2 (two) times daily.    medroxyPROGESTERone (DEPO-PROVERA) 150 MG/ML injection INJECT 1 ML (150 MG TOTAL) INTO THE MUSCLE EVERY 3 (THREE) MONTHS.   MULTIPLE VITAMIN PO 1 tablet daily.   OXcarbazepine (TRILEPTAL) 150 MG tablet Take 1 tablet by mouth 2 (two) times daily.   primidone (MYSOLINE) 250 MG tablet TAKE 1 TABLET BY MOUTH THREE TIMES DAILY AND 1/2 TABLET IN THE EVENING (Patient taking differently: Take 250 mg by mouth 3 (three) times daily. 1 tablet in AM and PM and 1.5 tablet in afternoon)   simvastatin (ZOCOR) 20 MG tablet TAKE 1 TABLET BY MOUTH EVERYDAY AT BEDTIME   Wheat Dextrin (BENEFIBER PO) Take by mouth. As needed   No facility-administered encounter medications on file as of 03/20/2020.    Allergies (verified) Aspirin   History: Past Medical History:  Diagnosis Date   Abnormal weight gain 02/24/2015   Cerebral seizure ([place]) 02/24/2015   dx at age 62.    Changeable mood 02/24/2015   Epilepsy ([place]) 05/16/2009   Episodic mood disorder ([place]) 09/11/2015   Family history of adverse reaction to anesthesia    mother has nausea and vomiting.   Headache, migraine 09/11/2015   Hematuria 06/04/2016   High cholesterol  History of kidney stones    HLD (hyperlipidemia) 05/16/2009   Intellectual disability 02/24/2015   Seizure ([place]) 09/11/2015   Past Surgical History:  Procedure Laterality Date   CYSTOSCOPY W/ RETROGRADES Bilateral 07/16/2016   Procedure: CYSTOSCOPY WITH RETROGRADE PYELOGRAM;  Surgeon: Hollice Espy, MD;  Location: [facility] ORS;  Service: Urology;  Laterality: Bilateral;   NO PAST SURGERIES     Family History  Problem  Relation Age of Onset   Heart attack Father    Stroke Maternal Grandmother    Pneumonia Maternal Grandfather    Cancer Paternal Grandfather    Prostate cancer Neg Hx    Kidney cancer Neg Hx    Breast cancer Neg Hx    Ovarian cancer Neg Hx    Colon cancer Neg Hx    Diabetes Neg Hx    Social History   Socioeconomic History   Marital status: Single    Spouse name: Not on file   Number of children: 0   Years of education: Not on file   Highest education level: 12th grade  Occupational History   Occupation: none  Tobacco Use   Smoking status: Never Smoker   Smokeless tobacco: Never Used  Scientific laboratory technician Use: Never used  Substance and Sexual Activity   Alcohol use: No    Alcohol/week: 0.0 standard drinks   Drug use: No   Sexual activity: Not Currently    Birth control/protection: None, Injection  Other Topics Concern   Not on file  Social History Narrative   Not on file   Social Determinants of Health   Financial Resource Strain: Low Risk    Difficulty of Paying Living Expenses: Not hard at all  Food Insecurity: No Food Insecurity   Worried About Charity fundraiser in the Last Year: Never true   Ran Out of Food in the Last Year: Never true  Transportation Needs: No Transportation Needs   Lack of Transportation (Medical): No   Lack of Transportation (Non-Medical): No  Physical Activity: Inactive   Days of Exercise per Week: 0 days   Minutes of Exercise per Session: 0 min  Stress: No Stress Concern Present   Feeling of Stress : Only a little  Social Connections: Socially Isolated   Frequency of Communication with Friends and Family: Once a week   Frequency of Social Gatherings with Friends and Family: More than three times a week   Attends Religious Services: Never   Marine scientist or Organizations: No   Attends Music therapist: Never   Marital Status: Never married    Tobacco Counseling Counseling given: Not Answered   Clinical  Intake:  Pre-visit preparation completed: Yes  Pain : No/denies pain     Nutritional Risks: None Diabetes: No  How often do you need to have someone help you when you read instructions, pamphlets, or other written materials from your doctor or pharmacy?: 1 - Never  Diabetic? No  Interpreter Needed?: No  Information entered by :: [facility], LPN   Activities of Daily Living In your present state of health, do you have any difficulty performing the following activities: 03/20/2020  Hearing? N  Vision? N  Comment Wears eye glasses daily.  Difficulty concentrating or making decisions? N  Walking or climbing stairs? N  Dressing or bathing? N  Comment Has assistance with putting on bra.  Doing errands, shopping? Y  Comment Does not drive.  Preparing Food and eating ? N  Using the Toilet? N  In the past six months, have you accidently leaked urine? N  Do you have problems with loss of bowel control? N  Managing your Medications? N  Managing your Finances? Y  Comment Mother manages finances.  Housekeeping or managing your Housekeeping? Y  Comment Does light work.  Some recent data might be hidden    Patient Care Team: Chrismon, Vickki Muff, PA as PCP - General (Physician Assistant) Rubie Maid, MD as Referring Physician (Obstetrics and Gynecology) Anabel Bene, MD as Referring Physician (Neurology) Pa, [facility] as Consulting Physician (Dermatology)  Indicate any recent Medical Services you may have received from other than [facility] providers in the past year (date may be approximate).     Assessment:   This is a routine wellness examination for Sherrill.  Hearing/Vision screen No exam data present  Dietary issues and exercise activities discussed: Current Exercise Habits: The patient does not participate in regular exercise at present, Exercise limited by: None identified  Goals      DIET - INCREASE WATER INTAKE     Recommend increasing water intake  to 4-6 glasses a day.      LIFESTYLE - DECREASE FALLS RISK     Recommend to remove any items from the home that may cause slips or trips.       Depression Screen PHQ 2/9 Scores 03/20/2020 03/17/2019 08/25/2018 01/01/2018 12/19/2016  PHQ - 2 Score 0 0 0 0 0  PHQ- 9 Score - - - - 0    Fall Risk Fall Risk  03/20/2020 03/17/2019 08/25/2018 01/01/2018 12/19/2016  Falls in the past year? 1 1 0 No No  Comment tripped - - - -  Number falls in past yr: 0 0 - - -  Injury with Fall? 0 0 - - -  Follow up Falls prevention discussed Falls prevention discussed - - -    Any stairs in or around the home? Yes  If so, are there any without handrails? No  Home free of loose throw rugs in walkways, pet beds, electrical cords, etc? Yes  Adequate lighting in your home to reduce risk of falls? Yes   ASSISTIVE DEVICES UTILIZED TO PREVENT FALLS:  Life alert? No  Use of a cane, walker or w/c? No  Grab bars in the bathroom? No  Shower chair or bench in shower? No  Elevated toilet seat or a handicapped toilet? No    Cognitive Function:        Immunizations Immunization History  Administered Date(s) Administered   Influenza,inj,Quad PF,6+ Mos 06/06/2016, 06/17/2017, 07/29/2018, 06/22/2019   Tdap 07/22/2011    TDAP status: Up to date Flu Vaccine status: Up to date Covid-19 vaccine status: Completed vaccines  Qualifies for Shingles Vaccine? No     Screening Tests Health Maintenance  Topic Date Due   PAP SMEAR-Modifier  08/09/2019   INFLUENZA VACCINE  04/23/2020   TETANUS/TDAP  07/21/2021   HIV Screening  Completed    Health Maintenance  Health Maintenance Due  Topic Date Due   PAP SMEAR-Modifier  08/09/2019    Lung Cancer Screening: (Low Dose CT Chest recommended if Age 4-80 years, 30 pack-year currently smoking OR have quit w/in 15years.) does not qualify.   Additional Screening:  Vision Screening: Recommended annual ophthalmology exams for early detection of glaucoma and other  disorders of the eye. Is the patient up to date with their annual eye exam?  Yes  Who is the provider or what is the name of the office in which the  patient attends annual eye exams? [facility] If pt is not established with a provider, would they like to be referred to a provider to establish care? No .   Dental Screening: Recommended annual dental exams for proper oral hygiene  Community Resource Referral / Chronic Care Management: CRR required this visit?  No   CCM required this visit?  No      Plan:     I have personally reviewed and noted the following in the patient's chart:   Medical and social history Use of alcohol, tobacco or illicit drugs  Current medications and supplements Functional ability and status Nutritional status Physical activity Advanced directives List of other physicians Hospitalizations, surgeries, and ER visits in previous 12 months Vitals Screenings to include cognitive, depression, and falls Referrals and appointments  In addition, I have reviewed and discussed with patient certain preventive protocols, quality metrics, and best practice recommendations. A written personalized care plan for preventive services as well as general preventive health recommendations were provided to patient.     Fantasy Donald [place], [place]   01/19/7680   Nurse Notes: Pap smear to be completed at next apt with Dr Marcelline Mates.   Reviewed screening by Nurse Health Advisor. Was available for consultation. Agree with recommendations and plan.

## 2020-03-20 ENCOUNTER — Other Ambulatory Visit: Payer: Self-pay

## 2020-03-20 ENCOUNTER — Ambulatory Visit (INDEPENDENT_AMBULATORY_CARE_PROVIDER_SITE_OTHER): Payer: Medicare Other

## 2020-03-20 DIAGNOSIS — Z0000 Encounter for general adult medical examination without abnormal findings: Secondary | ICD-10-CM

## 2020-03-20 NOTE — Patient Instructions (Signed)
Ms. Wendy Green , Thank you for taking time to come for your Medicare Wellness Visit. I appreciate your ongoing commitment to your health goals. Please review the following plan we discussed and let me know if I can assist you in the future.   Screening recommendations/referrals: Recommended yearly ophthalmology/optometry visit for glaucoma screening and checkup Recommended yearly dental visit for hygiene and checkup  Vaccinations: Influenza vaccine: Done 06/22/19 Tdap vaccine: Up to date, due 06/2021   Advanced directives: Currently on file.  Conditions/risks identified: Fall risk prevention discussed today. Recommend to continue to increase water intake to 6-8 8 oz glasses a day.   Next appointment: 04/07/20 @ 10:40 AM with [place], Female Preventive care refers to lifestyle choices and visits with your health care provider that can promote health and wellness. What does preventive care include?  A yearly physical exam. This is also called an annual well check.  Dental exams once or twice a year.  Routine eye exams. Ask your health care provider how often you should have your eyes checked.  Personal lifestyle choices, including:  Daily care of your teeth and gums.  Regular physical activity.  Eating a healthy diet.  Avoiding tobacco and drug use.  Limiting alcohol use.  Practicing safe sex.  Taking low-dose aspirin daily starting at age 89.  Taking vitamin and mineral supplements as recommended by your health care provider. What happens during an annual well check? The services and screenings done by your health care provider during your annual well check will depend on your age, overall health, lifestyle risk factors, and family history of disease. Counseling  Your health care provider may ask you questions about your:  Alcohol use.  Tobacco use.  Drug use.  Emotional well-being.  Home and relationship well-being.  Sexual  activity.  Eating habits.  Work and work Statistician.  Method of birth control.  Menstrual cycle.  Pregnancy history. Screening  You may have the following tests or measurements:  Height, weight, and BMI.  Blood pressure.  Lipid and cholesterol levels. These may be checked every 5 years, or more frequently if you are over 4 years old.  Skin check.  Lung cancer screening. You may have this screening every year starting at age 18 if you have a 30-pack-year history of smoking and currently smoke or have quit within the past 15 years.  Fecal occult blood test (FOBT) of the stool. You may have this test every year starting at age 41.  Flexible sigmoidoscopy or colonoscopy. You may have a sigmoidoscopy every 5 years or a colonoscopy every 10 years starting at age 46.  Hepatitis C blood test.  Hepatitis B blood test.  Sexually transmitted disease (STD) testing.  Diabetes screening. This is done by checking your blood sugar (glucose) after you have not eaten for a while (fasting). You may have this done every 1-3 years.  Mammogram. This may be done every 1-2 years. Talk to your health care provider about when you should start having regular mammograms. This may depend on whether you have a family history of breast cancer.  BRCA-related cancer screening. This may be done if you have a family history of breast, ovarian, tubal, or peritoneal cancers.  Pelvic exam and Pap test. This may be done every 3 years starting at age 52. Starting at age 40, this may be done every 5 years if you have a Pap test in combination with an HPV test.  Bone density scan. This is  done to screen for osteoporosis. You may have this scan if you are at high risk for osteoporosis. Discuss your test results, treatment options, and if necessary, the need for more tests with your health care provider. Vaccines  Your health care provider may recommend certain vaccines, such as:  Influenza vaccine. This is  recommended every year.  Tetanus, diphtheria, and acellular pertussis (Tdap, Td) vaccine. You may need a Td booster every 10 years.  Zoster vaccine. You may need this after age 18.  Pneumococcal 13-valent conjugate (PCV13) vaccine. You may need this if you have certain conditions and were not previously vaccinated.  Pneumococcal polysaccharide (PPSV23) vaccine. You may need one or two doses if you smoke cigarettes or if you have certain conditions. Talk to your health care provider about which screenings and vaccines you need and how often you need them. This information is not intended to replace advice given to you by your health care provider. Make sure you discuss any questions you have with your health care provider. Document Released: 10/06/2015 Document Revised: 05/29/2016 Document Reviewed: 07/11/2015 Elsevier Interactive Patient Education  2017 [place] Prevention in the Home Falls can cause injuries. They can happen to people of all ages. There are many things you can do to make your home safe and to help prevent falls. What can I do on the outside of my home?  Regularly fix the edges of walkways and driveways and fix any cracks.  Remove anything that might make you trip as you walk through a door, such as a raised step or threshold.  Trim any bushes or trees on the path to your home.  Use bright outdoor lighting.  Clear any walking paths of anything that might make someone trip, such as rocks or tools.  Regularly check to see if handrails are loose or broken. Make sure that both sides of any steps have handrails.  Any raised decks and porches should have guardrails on the edges.  Have any leaves, snow, or ice cleared regularly.  Use sand or salt on walking paths during winter.  Clean up any spills in your garage right away. This includes oil or grease spills. What can I do in the bathroom?  Use night lights.  Install grab bars by the toilet and in  the tub and shower. Do not use towel bars as grab bars.  Use non-skid mats or decals in the tub or shower.  If you need to sit down in the shower, use a plastic, non-slip stool.  Keep the floor dry. Clean up any water that spills on the floor as soon as it happens.  Remove soap buildup in the tub or shower regularly.  Attach bath mats securely with double-sided non-slip rug tape.  Do not have throw rugs and other things on the floor that can make you trip. What can I do in the bedroom?  Use night lights.  Make sure that you have a light by your bed that is easy to reach.  Do not use any sheets or blankets that are too big for your bed. They should not hang down onto the floor.  Have a firm chair that has side arms. You can use this for support while you get dressed.  Do not have throw rugs and other things on the floor that can make you trip. What can I do in the kitchen?  Clean up any spills right away.  Avoid walking on wet floors.  Keep items  that you use a lot in easy-to-reach places.  If you need to reach something above you, use a strong step stool that has a grab bar.  Keep electrical cords out of the way.  Do not use floor polish or wax that makes floors slippery. If you must use wax, use non-skid floor wax.  Do not have throw rugs and other things on the floor that can make you trip. What can I do with my stairs?  Do not leave any items on the stairs.  Make sure that there are handrails on both sides of the stairs and use them. Fix handrails that are broken or loose. Make sure that handrails are as long as the stairways.  Check any carpeting to make sure that it is firmly attached to the stairs. Fix any carpet that is loose or worn.  Avoid having throw rugs at the top or bottom of the stairs. If you do have throw rugs, attach them to the floor with carpet tape.  Make sure that you have a light switch at the top of the stairs and the bottom of the stairs. If  you do not have them, ask someone to add them for you. What else can I do to help prevent falls?  Wear shoes that:  Do not have high heels.  Have rubber bottoms.  Are comfortable and fit you well.  Are closed at the toe. Do not wear sandals.  If you use a stepladder:  Make sure that it is fully opened. Do not climb a closed stepladder.  Make sure that both sides of the stepladder are locked into place.  Ask someone to hold it for you, if possible.  Clearly mark and make sure that you can see:  Any grab bars or handrails.  First and last steps.  Where the edge of each step is.  Use tools that help you move around (mobility aids) if they are needed. These include:  Canes.  Walkers.  Scooters.  Crutches.  Turn on the lights when you go into a dark area. Replace any light bulbs as soon as they burn out.  Set up your furniture so you have a clear path. Avoid moving your furniture around.  If any of your floors are uneven, fix them.  If there are any pets around you, be aware of where they are.  Review your medicines with your doctor. Some medicines can make you feel dizzy. This can increase your chance of falling. Ask your doctor what other things that you can do to help prevent falls. This information is not intended to replace advice given to you by your health care provider. Make sure you discuss any questions you have with your health care provider. Document Released: 07/06/2009 Document Revised: 02/15/2016 Document Reviewed: 10/14/2014 Elsevier Interactive Patient Education  2017 [employer].

## 2020-04-07 ENCOUNTER — Encounter: Payer: Medicare Other | Admitting: Family Medicine

## 2020-04-10 NOTE — Patient Instructions (Signed)
Preventive Care 40-44 Years Old, Female Preventive care refers to visits with your health care provider and lifestyle choices that can promote health and wellness. This includes:  A yearly physical exam. This may also be called an annual well check.  Regular dental visits and eye exams.  Immunizations.  Screening for certain conditions.  Healthy lifestyle choices, such as eating a healthy diet, getting regular exercise, not using drugs or products that contain nicotine and tobacco, and limiting alcohol use. What can I expect for my preventive care visit? Physical exam Your health care provider will check your:  Height and weight. This may be used to calculate body mass index (BMI), which tells if you are at a healthy weight.  Heart rate and blood pressure.  Skin for abnormal spots. Counseling Your health care provider may ask you questions about your:  Alcohol, tobacco, and drug use.  Emotional well-being.  Home and relationship well-being.  Sexual activity.  Eating habits.  Work and work environment.  Method of birth control.  Menstrual cycle.  Pregnancy history. What immunizations do I need?  Influenza (flu) vaccine  This is recommended every year. Tetanus, diphtheria, and pertussis (Tdap) vaccine  You may need a Td booster every 10 years. Varicella (chickenpox) vaccine  You may need this if you have not been vaccinated. Zoster (shingles) vaccine  You may need this after age 60. Measles, mumps, and rubella (MMR) vaccine  You may need at least one dose of MMR if you were born in 1957 or later. You may also need a second dose. Pneumococcal conjugate (PCV13) vaccine  You may need this if you have certain conditions and were not previously vaccinated. Pneumococcal polysaccharide (PPSV23) vaccine  You may need one or two doses if you smoke cigarettes or if you have certain conditions. Meningococcal conjugate (MenACWY) vaccine  You may need this if you  have certain conditions. Hepatitis A vaccine  You may need this if you have certain conditions or if you travel or work in places where you may be exposed to hepatitis A. Hepatitis B vaccine  You may need this if you have certain conditions or if you travel or work in places where you may be exposed to hepatitis B. Haemophilus influenzae type b (Hib) vaccine  You may need this if you have certain conditions. Human papillomavirus (HPV) vaccine  If recommended by your health care provider, you may need three doses over 6 months. You may receive vaccines as individual doses or as more than one vaccine together in one shot (combination vaccines). Talk with your health care provider about the risks and benefits of combination vaccines. What tests do I need? Blood tests  Lipid and cholesterol levels. These may be checked every 5 years, or more frequently if you are over 50 years old.  Hepatitis C test.  Hepatitis B test. Screening  Lung cancer screening. You may have this screening every year starting at age 55 if you have a 30-pack-year history of smoking and currently smoke or have quit within the past 15 years.  Colorectal cancer screening. All adults should have this screening starting at age 50 and continuing until age 75. Your health care provider may recommend screening at age 45 if you are at increased risk. You will have tests every 1-10 years, depending on your results and the type of screening test.  Diabetes screening. This is done by checking your blood sugar (glucose) after you have not eaten for a while (fasting). You may have this   done every 1-3 years.  Mammogram. This may be done every 1-2 years. Talk with your health care provider about when you should start having regular mammograms. This may depend on whether you have a family history of breast cancer.  BRCA-related cancer screening. This may be done if you have a family history of breast, ovarian, tubal, or peritoneal  cancers.  Pelvic exam and Pap test. This may be done every 3 years starting at age 60. Starting at age 7, this may be done every 5 years if you have a Pap test in combination with an HPV test. Other tests  Sexually transmitted disease (STD) testing.  Bone density scan. This is done to screen for osteoporosis. You may have this scan if you are at high risk for osteoporosis. Follow these instructions at home: Eating and drinking  Eat a diet that includes fresh fruits and vegetables, whole grains, lean protein, and low-fat dairy.  Take vitamin and mineral supplements as recommended by your health care provider.  Do not drink alcohol if: ? Your health care provider tells you not to drink. ? You are pregnant, may be pregnant, or are planning to become pregnant.  If you drink alcohol: ? Limit how much you have to 0-1 drink a day. ? Be aware of how much alcohol is in your drink. In the [place] one drink equals one 12 oz bottle of beer (355 mL), one 5 oz glass of wine (148 mL), or one 1 oz glass of hard liquor (44 mL). Lifestyle  Take daily care of your teeth and gums.  Stay active. Exercise for at least 30 minutes on 5 or more days each week.  Do not use any products that contain nicotine or tobacco, such as cigarettes, e-cigarettes, and chewing tobacco. If you need help quitting, ask your health care provider.  If you are sexually active, practice safe sex. Use a condom or other form of birth control (contraception) in order to prevent pregnancy and STIs (sexually transmitted infections).  If told by your health care provider, take low-dose aspirin daily starting at age 48. What's next?  Visit your health care provider once a year for a well check visit.  Ask your health care provider how often you should have your eyes and teeth checked.  Stay up to date on all vaccines. This information is not intended to replace advice given to you by your health care provider. Make sure you  discuss any questions you have with your health care provider. Document Revised: 05/21/2018 Document Reviewed: 05/21/2018 Elsevier Patient Education  2020 [place] Breast self-awareness is knowing how your breasts look and feel. Doing breast self-awareness is important. It allows you to catch a breast problem early while it is still small and can be treated. All women should do breast self-awareness, including women who have had breast implants. Tell your doctor if you notice a change in your breasts. What you need:  A mirror.  A well-lit room. How to do a breast self-exam A breast self-exam is one way to learn what is normal for your breasts and to check for changes. To do a breast self-exam: Look for changes  1. Take off all the clothes above your waist. 2. Stand in front of a mirror in a room with good lighting. 3. Put your hands on your hips. 4. Push your hands down. 5. Look at your breasts and nipples in the mirror to see if one breast or nipple looks different from the  other. Check to see if: ? The shape of one breast is different. ? The size of one breast is different. ? There are wrinkles, dips, and bumps in one breast and not the other. 6. Look at each breast for changes in the skin, such as: ? Redness. ? Scaly areas. 7. Look for changes in your nipples, such as: ? Liquid around the nipples. ? Bleeding. ? Dimpling. ? Redness. ? A change in where the nipples are. Feel for changes  1. Lie on your back on the floor. 2. Feel each breast. To do this, follow these steps: ? Pick a breast to feel. ? Put the arm closest to that breast above your head. ? Use your other arm to feel the nipple area of your breast. Feel the area with the pads of your three middle fingers by making small circles with your fingers. For the first circle, press lightly. For the second circle, press harder. For the third circle, press even harder. ? Keep making circles with  your fingers at the different pressures as you move down your breast. Stop when you feel your ribs. ? Move your fingers a little toward the center of your body. ? Start making circles with your fingers again, this time going up until you reach your collarbone. ? Keep making up-and-down circles until you reach your armpit. Remember to keep using the three pressures. ? Feel the other breast in the same way. 3. Sit or stand in the tub or shower. 4. With soapy water on your skin, feel each breast the same way you did in step 2 when you were lying on the floor. Write down what you find Writing down what you find can help you remember what to tell your doctor. Write down:  What is normal for each breast.  Any changes you find in each breast, including: ? The kind of changes you find. ? Whether you have pain. ? Size and location of any lumps.  When you last had your menstrual period. General tips  Check your breasts every month.  If you are breastfeeding, the best time to check your breasts is after you feed your baby or after you use a breast pump.  If you get menstrual periods, the best time to check your breasts is 5-7 days after your menstrual period is over.  With time, you will become comfortable with the self-exam, and you will begin to know if there are changes in your breasts. Contact a doctor if you:  See a change in the shape or size of your breasts or nipples.  See a change in the skin of your breast or nipples, such as red or scaly skin.  Have fluid coming from your nipples that is not normal.  Find a lump or thick area that was not there before.  Have pain in your breasts.  Have any concerns about your breast health. Summary  Breast self-awareness includes looking for changes in your breasts, as well as feeling for changes within your breasts.  Breast self-awareness should be done in front of a mirror in a well-lit room.  You should check your breasts every month.  If you get menstrual periods, the best time to check your breasts is 5-7 days after your menstrual period is over.  Let your doctor know of any changes you see in your breasts, including changes in size, changes on the skin, pain or tenderness, or fluid from your nipples that is not normal. This information is not  intended to replace advice given to you by your health care provider. Make sure you discuss any questions you have with your health care provider. Document Revised: 04/28/2018 Document Reviewed: 04/28/2018 Elsevier Patient Education  [place].

## 2020-04-10 NOTE — Progress Notes (Signed)
Pt present for annual exam. Pt stated noticing some bloody spotting off and on only when she wipes. Pt had covid vaccine.

## 2020-04-11 ENCOUNTER — Ambulatory Visit (INDEPENDENT_AMBULATORY_CARE_PROVIDER_SITE_OTHER): Payer: Medicare Other | Admitting: Obstetrics and Gynecology

## 2020-04-11 ENCOUNTER — Encounter: Payer: Self-pay | Admitting: Obstetrics and Gynecology

## 2020-04-11 VITALS — BP 103/60 | HR 86 | Ht 64.0 in | Wt 169.5 lb

## 2020-04-11 DIAGNOSIS — Z1231 Encounter for screening mammogram for malignant neoplasm of breast: Secondary | ICD-10-CM

## 2020-04-11 DIAGNOSIS — E663 Overweight: Secondary | ICD-10-CM

## 2020-04-11 DIAGNOSIS — Z3042 Encounter for surveillance of injectable contraceptive: Secondary | ICD-10-CM | POA: Diagnosis not present

## 2020-04-11 DIAGNOSIS — Z01419 Encounter for gynecological examination (general) (routine) without abnormal findings: Secondary | ICD-10-CM | POA: Diagnosis not present

## 2020-04-11 MED ORDER — MEDROXYPROGESTERONE ACETATE 150 MG/ML IM SUSP: 150.0000 mg | INTRAMUSCULAR | 3 refills | Status: DC

## 2020-04-11 NOTE — Progress Notes (Deleted)
Duplicate

## 2020-04-11 NOTE — Progress Notes (Signed)
GYNECOLOGY ANNUAL PHYSICAL EXAM PROGRESS NOTE  Subjective:    Wendy Green is a 44 y.o. [place] female with mild mental delay who presents for an annual exam.  The patient has never been sexually active.  The patient wears seatbelts: yes. The patient participates in regular exercise: no. Has the patient ever been transfused or tattooed?: no. The patient reports that there is not domestic violence in her life.    The patient has the following complaints today:  1. None   Gynecologic History  Menarche age: 69 No LMP recorded. Patient has had an injection. Contraception: Depo-Provera injections (for management of heavy menses) History of STI's: Denies. Last Pap: 07/2016. Results were: normal.  Denies h/o abnormal pap smears. Last mammogram: 02/17/2018. Results were: normal Dexa Scan: 11/10/2019.  Results were: normal.    OB History  Gravida Para Term Preterm AB Living  0 0 0 0 0 0  SAB TAB Ectopic Multiple Live Births  0 0 0 0 0    Past Medical History:  Diagnosis Date  . Abnormal weight gain 02/24/2015  . Cerebral seizure ([place]) 02/24/2015   dx at age 62.   . Changeable mood 02/24/2015  . Epilepsy ([place]) 05/16/2009  . Episodic mood disorder ([place]) 09/11/2015  . Family history of adverse reaction to anesthesia    mother has nausea and vomiting.  Marland Kitchen Headache, migraine 09/11/2015  . Hematuria 06/04/2016  . High cholesterol   . History of kidney stones   . HLD (hyperlipidemia) 05/16/2009  . Intellectual disability 02/24/2015  . Seborrheic dermatitis of scalp   . Seizure ([place]) 09/11/2015    Past Surgical History:  Procedure Laterality Date  . CYSTOSCOPY W/ RETROGRADES Bilateral 07/16/2016   Procedure: CYSTOSCOPY WITH RETROGRADE PYELOGRAM;  Surgeon: Hollice Espy, MD;  Location: [facility] ORS;  Service: Urology;  Laterality: Bilateral;  . NO PAST SURGERIES      Family History  Problem Relation Age of Onset  . Heart attack Father   . Stroke Maternal Grandmother   . Pneumonia  Maternal Grandfather   . Cancer Paternal Grandfather   . Prostate cancer Neg Hx   . Kidney cancer Neg Hx   . Breast cancer Neg Hx   . Ovarian cancer Neg Hx   . Colon cancer Neg Hx   . Diabetes Neg Hx     Social History   Socioeconomic History  . Marital status: Single    Spouse name: Not on file  . Number of children: 0  . Years of education: Not on file  . Highest education level: 12th grade  Occupational History  . Occupation: none  Tobacco Use  . Smoking status: Never Smoker  . Smokeless tobacco: Never Used  Vaping Use  . Vaping Use: Never used  Substance and Sexual Activity  . Alcohol use: No    Alcohol/week: 0.0 standard drinks  . Drug use: No  . Sexual activity: Not Currently    Birth control/protection: None, Injection  Other Topics Concern  . Not on file  Social History Narrative  . Not on file   Social Determinants of Health   Financial Resource Strain: Low Risk   . Difficulty of Paying Living Expenses: Not hard at all  Food Insecurity: No Food Insecurity  . Worried About Charity fundraiser in the Last Year: Never true  . Ran Out of Food in the Last Year: Never true  Transportation Needs: No Transportation Needs  . Lack of Transportation (Medical): No  . Lack  of Transportation (Non-Medical): No  Physical Activity: Inactive  . Days of Exercise per Week: 0 days  . Minutes of Exercise per Session: 0 min  Stress: No Stress Concern Present  . Feeling of Stress : Only a little  Social Connections: Socially Isolated  . Frequency of Communication with Friends and Family: Once a week  . Frequency of Social Gatherings with Friends and Family: More than three times a week  . Attends Religious Services: Never  . Active Member of Clubs or Organizations: No  . Attends Archivist Meetings: Never  . Marital Status: Never married  Intimate Partner Violence: Not At Risk  . Fear of Current or Ex-Partner: No  . Emotionally Abused: No  . Physically Abused:  No  . Sexually Abused: No    Current Outpatient Medications on File Prior to Visit  Medication Sig Dispense Refill  . acetaminophen (TYLENOL) 500 MG tablet Take 500 mg by mouth every 6 (six) hours as needed.    Marland Kitchen acetaZOLAMIDE (DIAMOX) 250 MG tablet TAKE 1 TABLET BY MOUTH 3 TIMES A DAY 90 tablet 3  . fluocinonide gel (LIDEX) 9.38 % Apply 1 application topically 2 (two) times daily.    . fluticasone (FLONASE) 50 MCG/ACT nasal spray Place into both nostrils daily.    . folic acid (FOLVITE) 1 MG tablet TAKE 1 TABLET BY MOUTH ONCE DAILY    . ketoconazole (NIZORAL) 2 % shampoo Apply 1 application topically 2 (two) times a week. 2-3 times a week    . KRILL OIL PO Take 1 capsule by mouth 2 (two) times daily.     . medroxyPROGESTERone (DEPO-PROVERA) 150 MG/ML injection INJECT 1 ML (150 MG TOTAL) INTO THE MUSCLE EVERY 3 (THREE) MONTHS. 1 mL 3  . MULTIPLE VITAMIN PO 1 tablet daily.    . OXcarbazepine (TRILEPTAL) 150 MG tablet Take 1 tablet by mouth 2 (two) times daily.    . primidone (MYSOLINE) 250 MG tablet TAKE 1 TABLET BY MOUTH THREE TIMES DAILY AND 1/2 TABLET IN THE EVENING (Patient taking differently: Take 250 mg by mouth 3 (three) times daily. 1 tablet in AM and PM and 1.5 tablet in afternoon) 100 tablet 3  . simvastatin (ZOCOR) 20 MG tablet TAKE 1 TABLET BY MOUTH EVERYDAY AT BEDTIME 90 tablet 3  . Wheat Dextrin (BENEFIBER PO) Take by mouth. As needed     No current facility-administered medications on file prior to visit.    Allergies  Allergen Reactions  . Aspirin      Review of Systems Constitutional: negative for chills, fatigue, fevers and sweats Eyes: negative for irritation, redness and visual disturbance Ears, nose, mouth, throat, and face: negative for hearing loss, nasal congestion, snoring and tinnitus Respiratory: negative for asthma, cough, sputum Cardiovascular: negative for chest pain, dyspnea, exertional chest pressure/discomfort, irregular heart beat, palpitations and  syncope Gastrointestinal: negative for abdominal pain, change in bowel habits, nausea and vomiting Genitourinary: negative for abnormal menstrual periods, genital lesions, sexual problems and vaginal discharge, dysuria and urinary incontinence Integument/breast: negative for breast lump, breast tenderness and nipple discharge Hematologic/lymphatic: negative for bleeding and easy bruising Musculoskeletal: Negative for muscle or joint pain.  Neurological: negative for dizziness, headaches, vertigo and weakness Endocrine: negative for diabetic symptoms including polydipsia, polyuria and skin dryness Allergic/Immunologic: negative for hay fever and urticaria       Objective:  Blood pressure 103/60, pulse 86, height 5\' 4"  (1.626 m), weight 169 lb 8 oz (76.9 kg). Body mass index is 29.09 kg/m.  General Appearance:    Alert, cooperative, no distress, appears stated age, overweight  Head:    Normocephalic, without obvious abnormality, atraumatic  Eyes:    PERRL, conjunctiva/corneas clear, EOM's intact, both eyes  Ears:    Normal external ear canals, both ears  Nose:   Nares normal, septum midline, mucosa normal, no drainage or sinus tenderness  Throat:   Lips, mucosa, and tongue normal; teeth and gums normal  Neck:   Supple, symmetrical, trachea midline, no adenopathy; thyroid: no enlargement/tenderness/nodules; no carotid bruit or JVD  Back:     Symmetric, no curvature, ROM normal, no CVA tenderness  Lungs:     Clear to auscultation bilaterally, respirations unlabored  Chest Wall:    No tenderness or deformity   Heart:    Regular rate and rhythm, S1 and S2 normal, no murmur, rub or gallop  Breast Exam:    No tenderness, masses, or nipple abnormality  Abdomen:     Soft, non-tender, bowel sounds active all four quadrants, no masses, no organomegaly.    Genitalia:    Pelvic:external genitalia normal, vagina without lesions, discharge, or tenderness, rectovaginal septum  normal. Cervix normal in  appearance, no cervical motion tenderness, no adnexal masses or tenderness.  Uterus normal size, shape, mobile, regular contours, nontender.  Rectal:    Normal external sphincter.  No hemorrhoids appreciated. Internal exam not done.   Extremities:   Extremities normal, atraumatic, no cyanosis or edema  Pulses:   2+ and symmetric all extremities  Skin:   Skin color, texture, turgor normal, no rashes or lesions  Lymph nodes:   Cervical, supraclavicular, and axillary nodes normal  Neurologic:   CNII-XII intact, normal strength, sensation and reflexes throughout   .  Labs:  Lab Results  Component Value Date   WBC 7.8 03/24/2019   HGB 14.1 03/24/2019   HCT 41.8 03/24/2019   MCV 98 (H) 03/24/2019   PLT 222 03/24/2019    Lab Results  Component Value Date   CREATININE 0.60 03/24/2019   BUN 10 03/24/2019   NA 140 03/24/2019   K 3.9 03/24/2019   CL 109 (H) 03/24/2019   CO2 18 (L) 03/24/2019    Lab Results  Component Value Date   ALT 25 03/24/2019   AST 18 03/24/2019   ALKPHOS 80 03/24/2019   BILITOT <0.2 03/24/2019    Lab Results  Component Value Date   TSH 3.380 03/24/2019   Lab Results  Component Value Date   CHOL 195 03/24/2019   HDL 62 03/24/2019   LDLCALC 109 (H) 03/24/2019   TRIG 118 03/24/2019   CHOLHDL 3.1 03/24/2019     Assessment:   Encounter for well woman exam with routine gynecological exam  Surveillance of depo-Provera  Screening for breast cancer Overweight (BMI 25.0-29.9)  Plan:    - Blood tests: None ordered.  Plans to have labs with PCP in August.  - Breast self exam technique reviewed and patient encouraged to perform self-exam monthly. - Pap smear: not performed. Last screening done in 2017. As patient has never been sexually active, is at very low risk of cervical cancer. Can after 5 years.  - Contraception: Depo-Provera injections.  Has been using for 2.5 years.  Dexa scan performed for bone density due to risk of reversible bone loss,  normal. Can continue use. Next injection due in October.  - Discussed healthy lifestyle modifications. - Follow up in 1 year for annual exam.  - Had COVID vaccination series.  I have seen and examined the patient with Doristine Locks, [place] PA-S.  I have reviewed the record and concur with patient management and plan.   Rubie Maid, MD [facility]   Rubie Maid, MD [facility]

## 2020-04-17 ENCOUNTER — Encounter: Payer: Medicare Other | Admitting: Family Medicine

## 2020-05-15 ENCOUNTER — Other Ambulatory Visit: Payer: Self-pay

## 2020-05-15 ENCOUNTER — Ambulatory Visit (INDEPENDENT_AMBULATORY_CARE_PROVIDER_SITE_OTHER): Payer: Medicare Other | Admitting: Physician Assistant

## 2020-05-15 ENCOUNTER — Encounter: Payer: Self-pay | Admitting: Physician Assistant

## 2020-05-15 VITALS — BP 106/74 | HR 82 | Temp 98.2°F | Resp 16 | Ht 64.25 in | Wt 168.0 lb

## 2020-05-15 DIAGNOSIS — G40309 Generalized idiopathic epilepsy and epileptic syndromes, not intractable, without status epilepticus: Secondary | ICD-10-CM

## 2020-05-15 DIAGNOSIS — Z23 Encounter for immunization: Secondary | ICD-10-CM | POA: Diagnosis not present

## 2020-05-15 DIAGNOSIS — E782 Mixed hyperlipidemia: Secondary | ICD-10-CM | POA: Diagnosis not present

## 2020-05-15 DIAGNOSIS — F79 Unspecified intellectual disabilities: Secondary | ICD-10-CM

## 2020-05-15 MED ORDER — SIMVASTATIN 20 MG PO TABS: ORAL_TABLET | ORAL | 3 refills | Status: DC

## 2020-05-15 NOTE — Patient Instructions (Signed)
Health Maintenance, Female Adopting a healthy lifestyle and getting preventive care are important in promoting health and wellness. Ask your health care provider about:  The right schedule for you to have regular tests and exams.  Things you can do on your own to prevent diseases and keep yourself healthy. What should I know about diet, weight, and exercise? Eat a healthy diet   Eat a diet that includes plenty of vegetables, fruits, low-fat dairy products, and lean protein.  Do not eat a lot of foods that are high in solid fats, added sugars, or sodium. Maintain a healthy weight Body mass index (BMI) is used to identify weight problems. It estimates body fat based on height and weight. Your health care provider can help determine your BMI and help you achieve or maintain a healthy weight. Get regular exercise Get regular exercise. This is one of the most important things you can do for your health. Most adults should:  Exercise for at least 150 minutes each week. The exercise should increase your heart rate and make you sweat (moderate-intensity exercise).  Do strengthening exercises at least twice a week. This is in addition to the moderate-intensity exercise.  Spend less time sitting. Even light physical activity can be beneficial. Watch cholesterol and blood lipids Have your blood tested for lipids and cholesterol at 44 years of age, then have this test every 5 years. Have your cholesterol levels checked more often if:  Your lipid or cholesterol levels are high.  You are older than 44 years of age.  You are at high risk for heart disease. What should I know about cancer screening? Depending on your health history and family history, you may need to have cancer screening at various ages. This may include screening for:  Breast cancer.  Cervical cancer.  Colorectal cancer.  Skin cancer.  Lung cancer. What should I know about heart disease, diabetes, and high blood  pressure? Blood pressure and heart disease  High blood pressure causes heart disease and increases the risk of stroke. This is more likely to develop in people who have high blood pressure readings, are of African descent, or are overweight.  Have your blood pressure checked: ? Every 3-5 years if you are 18-39 years of age. ? Every year if you are 40 years old or older. Diabetes Have regular diabetes screenings. This checks your fasting blood sugar level. Have the screening done:  Once every three years after age 40 if you are at a normal weight and have a low risk for diabetes.  More often and at a younger age if you are overweight or have a high risk for diabetes. What should I know about preventing infection? Hepatitis B If you have a higher risk for hepatitis B, you should be screened for this virus. Talk with your health care provider to find out if you are at risk for hepatitis B infection. Hepatitis C Testing is recommended for:  Everyone born from 1945 through 1965.  Anyone with known risk factors for hepatitis C. Sexually transmitted infections (STIs)  Get screened for STIs, including gonorrhea and chlamydia, if: ? You are sexually active and are younger than 44 years of age. ? You are older than 44 years of age and your health care provider tells you that you are at risk for this type of infection. ? Your sexual activity has changed since you were last screened, and you are at increased risk for chlamydia or gonorrhea. Ask your health care provider if   you are at risk.  Ask your health care provider about whether you are at high risk for HIV. Your health care provider may recommend a prescription medicine to help prevent HIV infection. If you choose to take medicine to prevent HIV, you should first get tested for HIV. You should then be tested every 3 months for as long as you are taking the medicine. Pregnancy  If you are about to stop having your period (premenopausal) and  you may become pregnant, seek counseling before you get pregnant.  Take 400 to 800 micrograms (mcg) of folic acid every day if you become pregnant.  Ask for birth control (contraception) if you want to prevent pregnancy. Osteoporosis and menopause Osteoporosis is a disease in which the bones lose minerals and strength with aging. This can result in bone fractures. If you are 65 years old or older, or if you are at risk for osteoporosis and fractures, ask your health care provider if you should:  Be screened for bone loss.  Take a calcium or vitamin D supplement to lower your risk of fractures.  Be given hormone replacement therapy (HRT) to treat symptoms of menopause. Follow these instructions at home: Lifestyle  Do not use any products that contain nicotine or tobacco, such as cigarettes, e-cigarettes, and chewing tobacco. If you need help quitting, ask your health care provider.  Do not use street drugs.  Do not share needles.  Ask your health care provider for help if you need support or information about quitting drugs. Alcohol use  Do not drink alcohol if: ? Your health care provider tells you not to drink. ? You are pregnant, may be pregnant, or are planning to become pregnant.  If you drink alcohol: ? Limit how much you use to 0-1 drink a day. ? Limit intake if you are breastfeeding.  Be aware of how much alcohol is in your drink. In the [place] one drink equals one 12 oz bottle of beer (355 mL), one 5 oz glass of wine (148 mL), or one 1 oz glass of hard liquor (44 mL). General instructions  Schedule regular health, dental, and eye exams.  Stay current with your vaccines.  Tell your health care provider if: ? You often feel depressed. ? You have ever been abused or do not feel safe at home. Summary  Adopting a healthy lifestyle and getting preventive care are important in promoting health and wellness.  Follow your health care provider's instructions about healthy  diet, exercising, and getting tested or screened for diseases.  Follow your health care provider's instructions on monitoring your cholesterol and blood pressure. This information is not intended to replace advice given to you by your health care provider. Make sure you discuss any questions you have with your health care provider. Document Revised: 09/02/2018 Document Reviewed: 09/02/2018 Elsevier Patient Education  2020 Elsevier Inc.  

## 2020-05-15 NOTE — Progress Notes (Signed)
Established patient visit   Patient: Wendy Green   DOB: 1976/07/04   44 y.o. Female  MRN: [id number] Visit Date: 05/15/2020  Today's healthcare provider: Mar Daring, [name]   Chief Complaint  Patient presents with  . Follow-up   Subjective    HPI Patient had AWV with [facility] 03/20/20 Lipid/Cholesterol, Follow-up  Last lipid panel Other pertinent labs  Lab Results  Component Value Date   CHOL 181 05/16/2020   HDL 59 05/16/2020   LDLCALC 97 05/16/2020   TRIG 143 05/16/2020   CHOLHDL 3.1 03/24/2019   Lab Results  Component Value Date   ALT 25 05/16/2020   AST 19 05/16/2020   PLT 209 05/16/2020   TSH 4.300 05/16/2020     She was last seen for this 14 months ago.  Management since that visit includes continue current medication, Simvastatin 20 mg. Continue to control weight and follow a low fat diet.  She reports good compliance with treatment. She is not having side effects.   Symptoms: No chest pain No chest pressure/discomfort  No dyspnea No lower extremity edema  No numbness or tingling of extremity No orthopnea  No palpitations No paroxysmal nocturnal dyspnea  No speech difficulty No syncope    The 10-year ASCVD risk score Mikey Bussing DC Jr., et al., 2013) is: 0.4%  ------------------------------------------------------------------------------------------------- Intellectual Disability: History of congenital mental retardation and seizure disorder.   Patient Active Problem List   Diagnosis Date Noted  . Generalized idiopathic epilepsy, not intractable, without status epilepticus ([place]) 07/23/2017  . Hematuria 06/04/2016  . Headache, migraine 09/11/2015  . Episodic mood disorder ([place]) 09/11/2015  . Intellectual disability 02/24/2015  . Changeable mood 02/24/2015  . Cerebral seizure ([place]) 02/24/2015  . Abnormal weight gain 02/24/2015  . Epilepsy ([place]) 05/16/2009  . Hyperlipidemia, unspecified 05/16/2009   Past Medical History:  Diagnosis Date   . Abnormal weight gain 02/24/2015  . Cerebral seizure ([place]) 02/24/2015   dx at age 57.   . Changeable mood 02/24/2015  . Epilepsy ([place]) 05/16/2009  . Episodic mood disorder ([place]) 09/11/2015  . Family history of adverse reaction to anesthesia    mother has nausea and vomiting.  Marland Kitchen Headache, migraine 09/11/2015  . Hematuria 06/04/2016  . High cholesterol   . History of kidney stones   . HLD (hyperlipidemia) 05/16/2009  . Intellectual disability 02/24/2015  . Seborrheic dermatitis of scalp   . Seizure ([place]) 09/11/2015       Medications: Outpatient Medications Prior to Visit  Medication Sig  . acetaminophen (TYLENOL) 500 MG tablet Take 500 mg by mouth every 6 (six) hours as needed.  Marland Kitchen acetaZOLAMIDE (DIAMOX) 250 MG tablet TAKE 1 TABLET BY MOUTH 3 TIMES A DAY  . fluocinonide gel (LIDEX) 1.66 % Apply 1 application topically 2 (two) times daily.  . fluticasone (FLONASE) 50 MCG/ACT nasal spray Place into both nostrils daily.  . folic acid (FOLVITE) 1 MG tablet TAKE 1 TABLET BY MOUTH ONCE DAILY  . ketoconazole (NIZORAL) 2 % shampoo Apply 1 application topically 2 (two) times a week. 2-3 times a week  . KRILL OIL PO Take 1 capsule by mouth 2 (two) times daily.   . medroxyPROGESTERone (DEPO-PROVERA) 150 MG/ML injection Inject 1 mL (150 mg total) into the muscle every 3 (three) months.  . MULTIPLE VITAMIN PO 1 tablet daily.  . OXcarbazepine (TRILEPTAL) 150 MG tablet Take 1 tablet by mouth 2 (two) times daily.  . primidone (MYSOLINE) 250 MG tablet TAKE 1 TABLET BY  MOUTH THREE TIMES DAILY AND 1/2 TABLET IN THE EVENING (Patient taking differently: Take 250 mg by mouth 3 (three) times daily. 1 tablet in AM and PM and 1.5 tablet in afternoon)  . Wheat Dextrin (BENEFIBER PO) Take by mouth. As needed  . [DISCONTINUED] simvastatin (ZOCOR) 20 MG tablet TAKE 1 TABLET BY MOUTH EVERYDAY AT BEDTIME   No facility-administered medications prior to visit.    Review of Systems  Constitutional: Negative.   HENT:  Positive for drooling.   Eyes: Negative.   Respiratory: Negative.   Cardiovascular: Negative.   Gastrointestinal: Negative.   Endocrine: Negative.   Genitourinary: Negative.   Musculoskeletal: Positive for back pain and gait problem.  Skin: Negative.   Allergic/Immunologic: Positive for environmental allergies.  Neurological: Positive for seizures.  Hematological: Negative.   Psychiatric/Behavioral: Negative.     Last CBC Lab Results  Component Value Date   WBC 7.2 05/16/2020   HGB 14.4 05/16/2020   HCT 41.7 05/16/2020   MCV 97 05/16/2020   MCH 33.6 (H) 05/16/2020   RDW 12.8 05/16/2020   PLT 209 14/78/2956   Last metabolic panel Lab Results  Component Value Date   GLUCOSE 85 05/16/2020   NA 139 05/16/2020   K 3.8 05/16/2020   CL 108 (H) 05/16/2020   CO2 16 (L) 05/16/2020   BUN 10 05/16/2020   CREATININE 0.60 05/16/2020   GFRNONAA 112 05/16/2020   GFRAA 129 05/16/2020   CALCIUM 9.1 05/16/2020   PROT 7.0 05/16/2020   ALBUMIN 4.3 05/16/2020   LABGLOB 2.7 05/16/2020   AGRATIO 1.6 05/16/2020   BILITOT 0.3 05/16/2020   ALKPHOS 66 05/16/2020   AST 19 05/16/2020   ALT 25 05/16/2020   ANIONGAP 7 07/09/2016      Objective    BP 106/74 (BP Location: Left Arm, Patient Position: Sitting, Cuff Size: Large)   Pulse 82   Temp 98.2 F (36.8 C) (Oral)   Resp 16   Ht 5' 4.25" (1.632 m)   Wt 168 lb (76.2 kg)   BMI 28.61 kg/m  BP Readings from Last 3 Encounters:  05/15/20 106/74  04/11/20 103/60  12/06/19 94/64   Wt Readings from Last 3 Encounters:  05/15/20 168 lb (76.2 kg)  04/11/20 169 lb 8 oz (76.9 kg)  12/06/19 166 lb (75.3 kg)      Physical Exam Vitals reviewed.  Constitutional:      General: She is not in acute distress.    Appearance: Normal appearance. She is well-developed, well-groomed and overweight. She is not diaphoretic.  HENT:     Head: Normocephalic and atraumatic.     Right Ear: Tympanic membrane, ear canal and external ear normal.      Left Ear: Tympanic membrane, ear canal and external ear normal.     Nose: Nose normal.     Mouth/Throat:     Mouth: Mucous membranes are moist.     Pharynx: Oropharynx is clear. No oropharyngeal exudate.  Eyes:     General: No scleral icterus.       Right eye: No discharge.        Left eye: No discharge.     Extraocular Movements: Extraocular movements intact.     Conjunctiva/sclera: Conjunctivae normal.     Pupils: Pupils are equal, round, and reactive to light.  Neck:     Thyroid: No thyromegaly.     Vascular: No carotid bruit or JVD.     Trachea: No tracheal deviation.  Cardiovascular:     Rate  and Rhythm: Normal rate and regular rhythm.     Pulses: Normal pulses.     Heart sounds: Normal heart sounds. No murmur heard.  No friction rub. No gallop.   Pulmonary:     Effort: Pulmonary effort is normal. No respiratory distress.     Breath sounds: Normal breath sounds. No wheezing or rales.  Chest:     Chest wall: No tenderness.  Abdominal:     General: Abdomen is flat. Bowel sounds are normal. There is no distension.     Palpations: Abdomen is soft. There is no mass.     Tenderness: There is no abdominal tenderness. There is no guarding or rebound.  Musculoskeletal:        General: No tenderness. Normal range of motion.     Cervical back: Normal range of motion and neck supple.     Right lower leg: No edema.     Left lower leg: No edema.  Lymphadenopathy:     Cervical: No cervical adenopathy.  Skin:    General: Skin is warm and dry.     Capillary Refill: Capillary refill takes less than 2 seconds.     Findings: No rash.  Neurological:     General: No focal deficit present.     Mental Status: She is alert and oriented to person, place, and time. Mental status is at baseline.  Psychiatric:        Mood and Affect: Mood normal.        Behavior: Behavior normal. Behavior is cooperative.        Thought Content: Thought content normal.        Judgment: Judgment normal.       Results for orders placed or performed in visit on 05/15/20  CBC w/Diff/Platelet  Result Value Ref Range   WBC 7.2 3.4 - 10.8 x10E3/uL   RBC 4.29 3.77 - 5.28 x10E6/uL   Hemoglobin 14.4 11.1 - 15.9 g/dL   Hematocrit 41.7 34.0 - 46.6 %   MCV 97 79 - 97 fL   MCH 33.6 (H) 26.6 - 33.0 pg   MCHC 34.5 31 - 35 g/dL   RDW 12.8 11.7 - 15.4 %   Platelets 209 150 - 450 x10E3/uL   Neutrophils 63 Not Estab. %   Lymphs 23 Not Estab. %   Monocytes 9 Not Estab. %   Eos 4 Not Estab. %   Basos 1 Not Estab. %   Neutrophils Absolute 4.6 1 - 7 x10E3/uL   Lymphocytes Absolute 1.6 0 - 3 x10E3/uL   Monocytes Absolute 0.6 0 - 0 x10E3/uL   EOS (ABSOLUTE) 0.3 0.0 - 0.4 x10E3/uL   Basophils Absolute 0.0 0 - 0 x10E3/uL   Immature Granulocytes 0 Not Estab. %   Immature Grans (Abs) 0.0 0.0 - 0.1 x10E3/uL  Comprehensive Metabolic Panel (CMET)  Result Value Ref Range   Glucose 85 65 - 99 mg/dL   BUN 10 6 - 24 mg/dL   Creatinine, Ser 0.60 0.57 - 1.00 mg/dL   GFR calc non Af Amer 112 >59 mL/min/1.73   GFR calc Af Amer 129 >59 mL/min/1.73   BUN/Creatinine Ratio 17 9 - 23   Sodium 139 134 - 144 mmol/L   Potassium 3.8 3.5 - 5.2 mmol/L   Chloride 108 (H) 96 - 106 mmol/L   CO2 16 (L) 20 - 29 mmol/L   Calcium 9.1 8.7 - 10.2 mg/dL   Total Protein 7.0 6.0 - 8.5 g/dL   Albumin 4.3 3.8 - 4.8  g/dL   Globulin, Total 2.7 1.5 - 4.5 g/dL   Albumin/Globulin Ratio 1.6 1.2 - 2.2   Bilirubin Total 0.3 0.0 - 1.2 mg/dL   Alkaline Phosphatase 66 48 - 121 IU/L   AST 19 0 - 40 IU/L   ALT 25 0 - 32 IU/L  Lipid Panel With LDL/HDL Ratio  Result Value Ref Range   Cholesterol, Total 181 100 - 199 mg/dL   Triglycerides 143 0 - 149 mg/dL   HDL 59 >39 mg/dL   VLDL Cholesterol Cal 25 5 - 40 mg/dL   LDL Chol Calc (NIH) 97 0 - 99 mg/dL   LDL/HDL Ratio 1.6 0.0 - 3.2 ratio  TSH  Result Value Ref Range   TSH 4.300 0.450 - 4.500 uIU/mL  Phenobarbital level  Result Value Ref Range   Phenobarbital, Serum 23 15 - 40 ug/mL   10-Hydroxycarbazepine  Result Value Ref Range   Oxcarbazepine SerPl-[facility] WILL FOLLOW   B12 and Folate Panel  Result Value Ref Range   Vitamin B-12 644 232 - 1,245 pg/mL   Folate 19.5 >3.0 ng/mL    Assessment & Plan     1. Mixed hyperlipidemia Stable. Diagnosis pulled for medication refill. Continue current medical treatment plan. Will check labs as below and f/u pending results. - CBC w/Diff/Platelet - Comprehensive Metabolic Panel (CMET) - Lipid Panel With LDL/HDL Ratio - TSH - simvastatin (ZOCOR) 20 MG tablet; TAKE 1 TABLET BY MOUTH EVERYDAY AT BEDTIME  Dispense: 90 tablet; Refill: 3  2. Nonintractable generalized idiopathic epilepsy without status epilepticus ([place]) Has been stable. Followed by Dr. Melrose Nakayama, Neurology. Will check labs as below and f/u pending results. - CBC w/Diff/Platelet - Comprehensive Metabolic Panel (CMET) - Lipid Panel With LDL/HDL Ratio - TSH - Phenobarbital level - 10-Hydroxycarbazepine - B12 and Folate Panel  3. Intellectual disability Will check labs as below and f/u pending results. - CBC w/Diff/Platelet - Comprehensive Metabolic Panel (CMET) - Lipid Panel With LDL/HDL Ratio - TSH  4. Need for influenza vaccination Flu vaccine given today without complication. Patient sat upright for 15 minutes to check for adverse reaction before being released. - Flu Vaccine QUAD 6+ mos PF IM (Fluarix Quad PF)   Return in about 1 year (around 05/15/2021) for CPE.      Reynolds Bowl, [name], have reviewed all documentation for this visit. The documentation on 05/17/20 for the exam, diagnosis, procedures, and orders are all accurate and complete.   Rubye Beach  [facility] [phone] (phone) [phone] (fax)  [place]

## 2020-05-16 DIAGNOSIS — E782 Mixed hyperlipidemia: Secondary | ICD-10-CM | POA: Diagnosis not present

## 2020-05-16 DIAGNOSIS — G40309 Generalized idiopathic epilepsy and epileptic syndromes, not intractable, without status epilepticus: Secondary | ICD-10-CM | POA: Diagnosis not present

## 2020-05-16 DIAGNOSIS — F79 Unspecified intellectual disabilities: Secondary | ICD-10-CM | POA: Diagnosis not present

## 2020-05-19 ENCOUNTER — Telehealth: Payer: Self-pay

## 2020-05-19 LAB — LIPID PANEL WITH LDL/HDL RATIO
Cholesterol, Total: 181 mg/dL (ref 100–199)
HDL: 59 mg/dL (ref >39)
LDL Chol Calc (NIH): 97 mg/dL (ref 0–99)
LDL/HDL Ratio: 1.6 ratio (ref 0.0–3.2)
Triglycerides: 143 mg/dL (ref 0–149)
VLDL Cholesterol Cal: 25 mg/dL (ref 5–40)

## 2020-05-19 LAB — TSH: TSH: 4.3 u[IU]/mL (ref 0.450–4.500)

## 2020-05-19 LAB — COMPREHENSIVE METABOLIC PANEL
ALT: 25 IU/L (ref 0–32)
AST: 19 IU/L (ref 0–40)
Albumin/Globulin Ratio: 1.6 (ref 1.2–2.2)
Albumin: 4.3 g/dL (ref 3.8–4.8)
Alkaline Phosphatase: 66 IU/L (ref 48–121)
BUN/Creatinine Ratio: 17 (ref 9–23)
BUN: 10 mg/dL (ref 6–24)
Bilirubin Total: 0.3 mg/dL (ref 0.0–1.2)
CO2: 16 mmol/L — ABNORMAL LOW (ref 20–29)
Calcium: 9.1 mg/dL (ref 8.7–10.2)
Chloride: 108 mmol/L — ABNORMAL HIGH (ref 96–106)
Creatinine, Ser: 0.6 mg/dL (ref 0.57–1.00)
GFR calc Af Amer: 129 mL/min/{1.73_m2} (ref >59)
GFR calc non Af Amer: 112 mL/min/{1.73_m2} (ref >59)
Globulin, Total: 2.7 g/dL (ref 1.5–4.5)
Glucose: 85 mg/dL (ref 65–99)
Potassium: 3.8 mmol/L (ref 3.5–5.2)
Sodium: 139 mmol/L (ref 134–144)
Total Protein: 7 g/dL (ref 6.0–8.5)

## 2020-05-19 LAB — CBC WITH DIFFERENTIAL/PLATELET
Basophils Absolute: 0 10*3/uL (ref 0.0–0.2)
Basos: 1 %
EOS (ABSOLUTE): 0.3 10*3/uL (ref 0.0–0.4)
Eos: 4 %
Hematocrit: 41.7 % (ref 34.0–46.6)
Hemoglobin: 14.4 g/dL (ref 11.1–15.9)
Immature Grans (Abs): 0 10*3/uL (ref 0.0–0.1)
Immature Granulocytes: 0 %
Lymphocytes Absolute: 1.6 10*3/uL (ref 0.7–3.1)
Lymphs: 23 %
MCH: 33.6 pg — ABNORMAL HIGH (ref 26.6–33.0)
MCHC: 34.5 g/dL (ref 31.5–35.7)
MCV: 97 fL (ref 79–97)
Monocytes Absolute: 0.6 10*3/uL (ref 0.1–0.9)
Monocytes: 9 %
Neutrophils Absolute: 4.6 10*3/uL (ref 1.4–7.0)
Neutrophils: 63 %
Platelets: 209 10*3/uL (ref 150–450)
RBC: 4.29 x10E6/uL (ref 3.77–5.28)
RDW: 12.8 % (ref 11.7–15.4)
WBC: 7.2 10*3/uL (ref 3.4–10.8)

## 2020-05-19 LAB — B12 AND FOLATE PANEL
Folate: 19.5 ng/mL (ref >3.0)
Vitamin B-12: 644 pg/mL (ref 232–1245)

## 2020-05-19 LAB — 10-HYDROXYCARBAZEPINE: Oxcarbazepine SerPl-Mcnc: 2 ug/mL — ABNORMAL LOW (ref 10–35)

## 2020-05-19 LAB — PHENOBARBITAL LEVEL: Phenobarbital, Serum: 23 ug/mL (ref 15–40)

## 2020-05-19 NOTE — Telephone Encounter (Signed)
Patient advised as directed below. Faxed labs to Dr. Melrose Nakayama Neurologist

## 2020-05-19 NOTE — Telephone Encounter (Signed)
Patient advised as directed below. 

## 2020-05-19 NOTE — Telephone Encounter (Signed)
-----   Message from Mar Daring, [place] sent at 05/19/2020  9:14 AM EDT ----- Blood count is normal. Kidney and liver function is normal. Sodium, potassium, and calcium is normal. Cholesterol is normal. Thyroid is normal. Phenobarbital is normal. Oxcarbazepine level is at 2 which is stable compared to last year. B12 is normal. I will forward leabs to Dr. Melrose Nakayama as well.

## 2020-05-30 ENCOUNTER — Other Ambulatory Visit: Payer: Self-pay

## 2020-05-30 ENCOUNTER — Encounter: Payer: Self-pay | Admitting: Obstetrics and Gynecology

## 2020-05-30 ENCOUNTER — Ambulatory Visit: Payer: Medicare Other

## 2020-05-30 ENCOUNTER — Ambulatory Visit (INDEPENDENT_AMBULATORY_CARE_PROVIDER_SITE_OTHER): Payer: Medicare Other | Admitting: Obstetrics and Gynecology

## 2020-05-30 DIAGNOSIS — Z3042 Encounter for surveillance of injectable contraceptive: Secondary | ICD-10-CM | POA: Diagnosis not present

## 2020-05-30 MED ORDER — MEDROXYPROGESTERONE ACETATE 150 MG/ML IM SUSP
150.0000 mg | Freq: Once | INTRAMUSCULAR | Status: AC
  Administered 2020-05-30: 150 mg via INTRAMUSCULAR

## 2020-05-30 NOTE — Progress Notes (Signed)
Date last pap: 08/08/2016 Last Depo-Provera: 03/01/2020 Side Effects if any: none Serum HCG indicated? N/A Depo-Provera 150 mg IM given by: Abby Potash, LPN Next appointment due Nov 23-Aug 29, 2020

## 2020-06-29 IMAGING — MG DIGITAL SCREENING BILAT W/ TOMO W/ CAD
6 of 12 series · 6 of 36 positions shown · non-contrast
Comparison: Previous exam(s).

CLINICAL DATA: Screening.

EXAM:
DIGITAL SCREENING BILATERAL MAMMOGRAM WITH TOMO AND CAD

[L CC synth-2D]
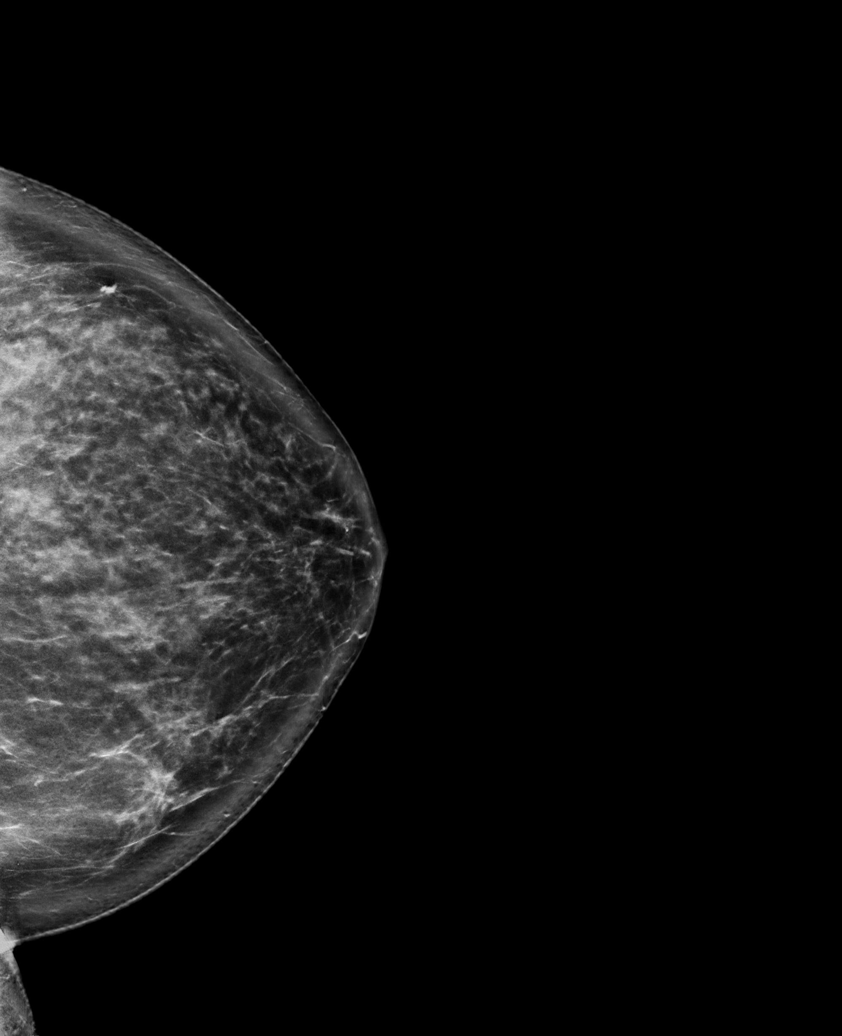

[R CC synth-2D (1 of 2)]
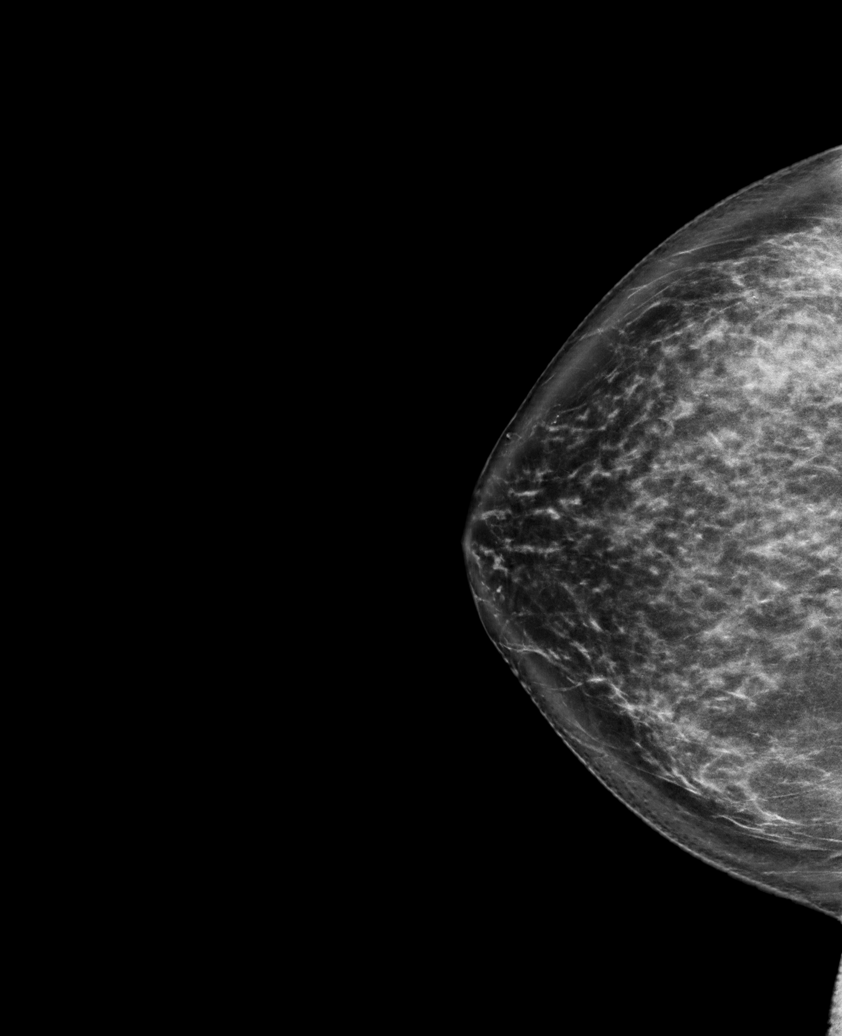

[R CC synth-2D (2 of 2)]
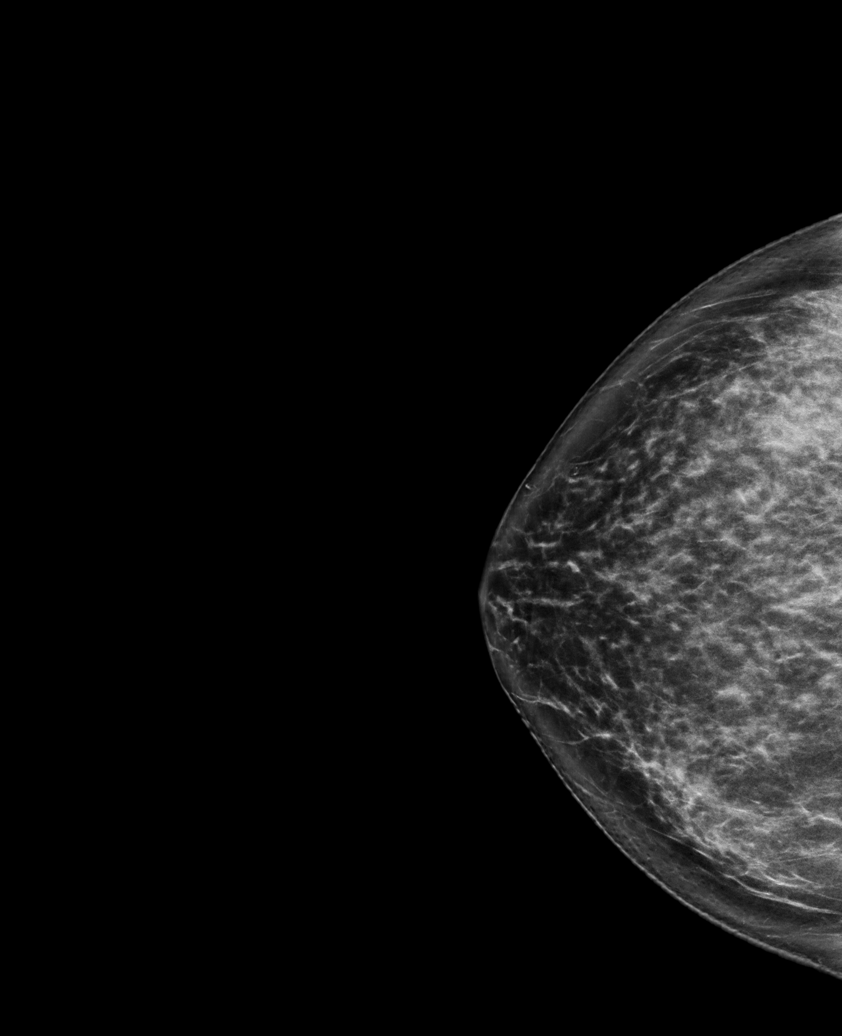

[R MLO synth-2D (1 of 2)]
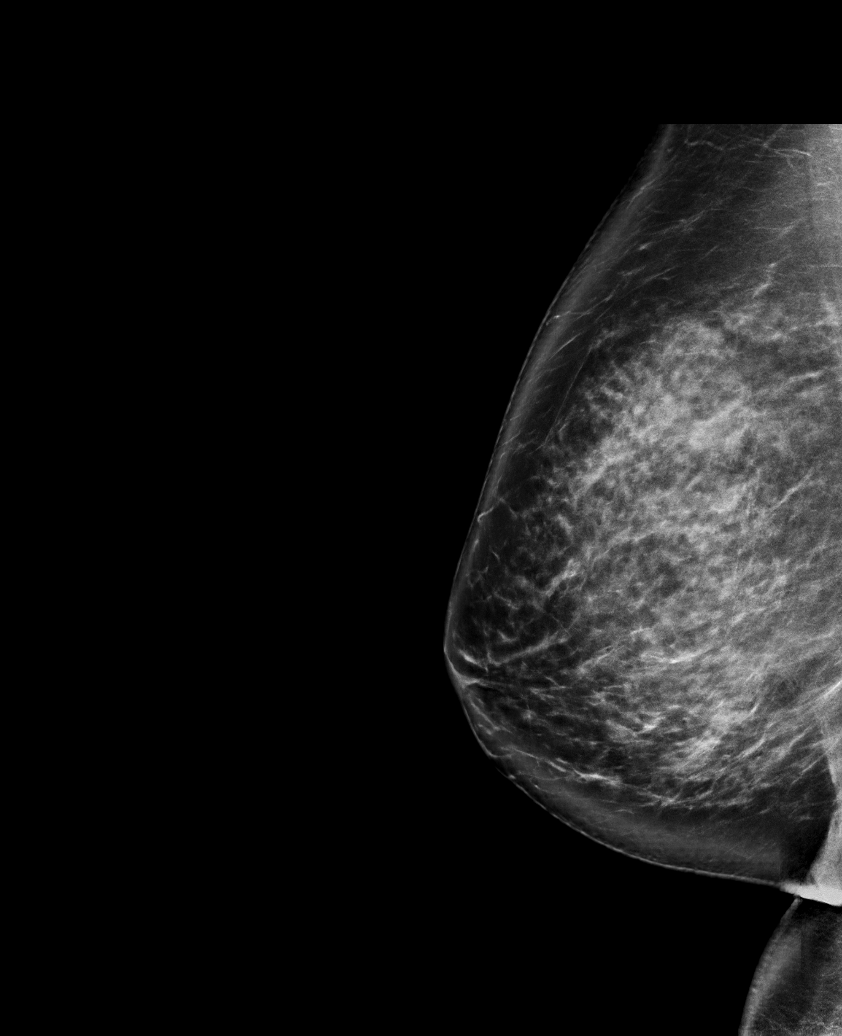

[R MLO synth-2D (2 of 2)]
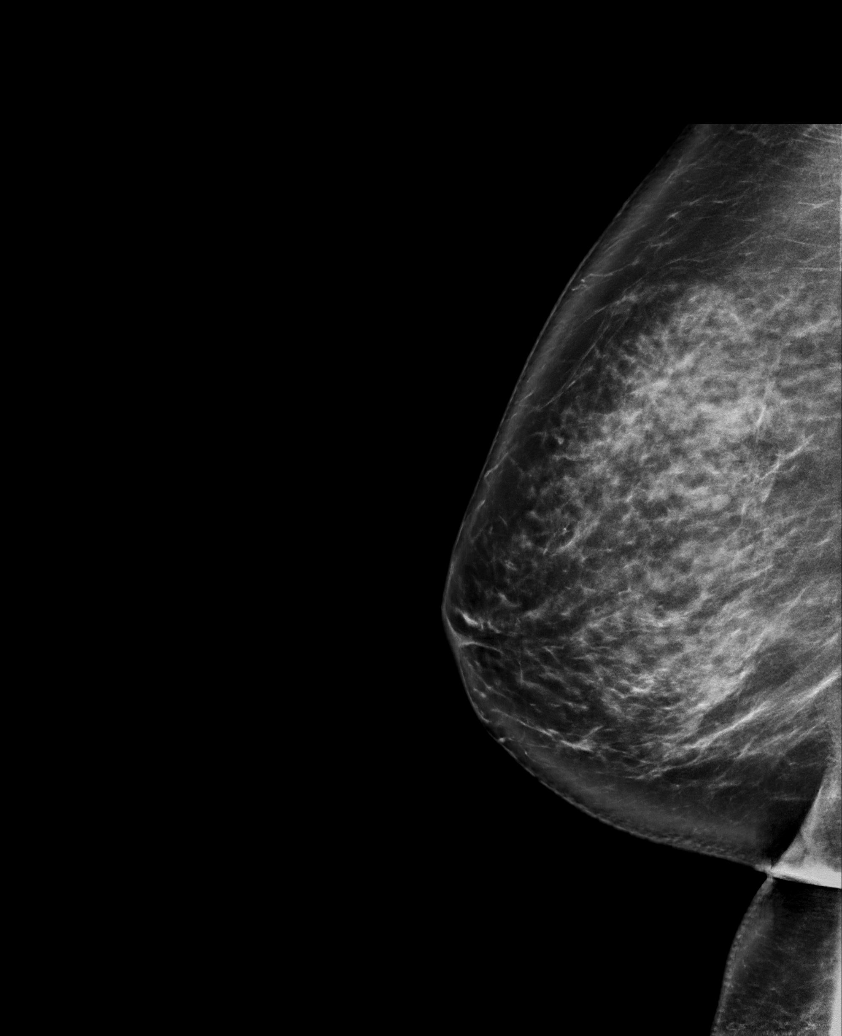

[L MLO synth-2D]
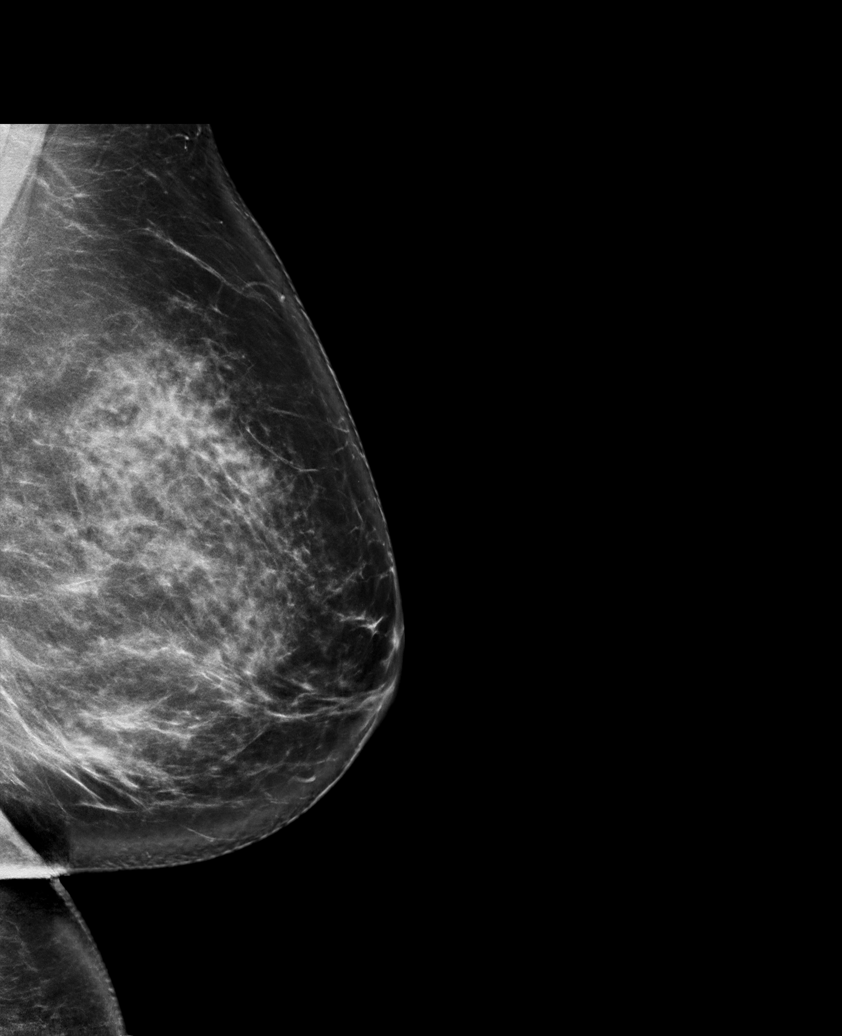

[6 of 36 positions shown; findings below may reference images not displayed]

ACR Breast Density Category c: The breast tissue is heterogeneously
dense, which may obscure small masses.
FINDINGS: There are no findings suspicious for malignancy. Images were
processed with CAD.
IMPRESSION: No mammographic evidence of malignancy. A result letter of this
screening mammogram will be mailed directly to the patient.

RECOMMENDATION:
Screening mammogram in one year. (Code:[id number])

BI-RADS CATEGORY  1: Negative.

## 2020-07-03 DIAGNOSIS — H53001 Unspecified amblyopia, right eye: Secondary | ICD-10-CM | POA: Diagnosis not present

## 2020-07-21 DIAGNOSIS — Z23 Encounter for immunization: Secondary | ICD-10-CM | POA: Diagnosis not present

## 2020-08-11 NOTE — Progress Notes (Signed)
Last depo inj: 05/30/20 UPT: N/A Side effects: none Next Depo- Provera injection due: 11/10/20-11/24/20 Annual exam due: 03/2021 Administered [id number], LPN

## 2020-08-21 ENCOUNTER — Other Ambulatory Visit: Payer: Self-pay

## 2020-08-21 ENCOUNTER — Ambulatory Visit
Admission: RE | Admit: 2020-08-21 | Discharge: 2020-08-21 | Disposition: A | Discharge status: Home / Self Care | Payer: Medicare Other | Source: Ambulatory Visit | Attending: Obstetrics and Gynecology | Admitting: Obstetrics and Gynecology

## 2020-08-21 DIAGNOSIS — Z1231 Encounter for screening mammogram for malignant neoplasm of breast: Secondary | ICD-10-CM | POA: Diagnosis not present

## 2020-08-22 ENCOUNTER — Ambulatory Visit: Payer: Medicare Other

## 2020-08-25 ENCOUNTER — Other Ambulatory Visit: Payer: Self-pay

## 2020-08-25 ENCOUNTER — Ambulatory Visit (INDEPENDENT_AMBULATORY_CARE_PROVIDER_SITE_OTHER): Payer: Medicare Other

## 2020-08-25 DIAGNOSIS — Z3042 Encounter for surveillance of injectable contraceptive: Secondary | ICD-10-CM | POA: Diagnosis not present

## 2020-08-25 MED ORDER — MEDROXYPROGESTERONE ACETATE 150 MG/ML IM SUSP
150.0000 mg | Freq: Once | INTRAMUSCULAR | Status: AC
  Administered 2020-08-25: 150 mg via INTRAMUSCULAR

## 2020-08-28 DIAGNOSIS — R569 Unspecified convulsions: Secondary | ICD-10-CM | POA: Diagnosis not present

## 2020-11-01 DIAGNOSIS — D225 Melanocytic nevi of trunk: Secondary | ICD-10-CM | POA: Diagnosis not present

## 2020-11-01 DIAGNOSIS — D2271 Melanocytic nevi of right lower limb, including hip: Secondary | ICD-10-CM | POA: Diagnosis not present

## 2020-11-01 DIAGNOSIS — D2262 Melanocytic nevi of left upper limb, including shoulder: Secondary | ICD-10-CM | POA: Diagnosis not present

## 2020-11-01 DIAGNOSIS — L821 Other seborrheic keratosis: Secondary | ICD-10-CM | POA: Diagnosis not present

## 2020-11-01 DIAGNOSIS — L218 Other seborrheic dermatitis: Secondary | ICD-10-CM | POA: Diagnosis not present

## 2020-11-01 DIAGNOSIS — D2272 Melanocytic nevi of left lower limb, including hip: Secondary | ICD-10-CM | POA: Diagnosis not present

## 2020-11-01 DIAGNOSIS — D2261 Melanocytic nevi of right upper limb, including shoulder: Secondary | ICD-10-CM | POA: Diagnosis not present

## 2020-11-22 ENCOUNTER — Ambulatory Visit: Payer: Medicare Other

## 2020-11-23 ENCOUNTER — Ambulatory Visit: Payer: Medicare Other

## 2020-11-24 ENCOUNTER — Other Ambulatory Visit: Payer: Self-pay

## 2020-11-24 ENCOUNTER — Ambulatory Visit (INDEPENDENT_AMBULATORY_CARE_PROVIDER_SITE_OTHER): Payer: Medicare Other | Admitting: Obstetrics and Gynecology

## 2020-11-24 DIAGNOSIS — Z3042 Encounter for surveillance of injectable contraceptive: Secondary | ICD-10-CM

## 2020-11-24 MED ORDER — MEDROXYPROGESTERONE ACETATE 150 MG/ML IM SUSP
150.0000 mg | Freq: Once | INTRAMUSCULAR | Status: AC
  Administered 2020-11-24: 150 mg via INTRAMUSCULAR

## 2020-11-24 NOTE — Progress Notes (Signed)
Date last pap: 08/08/2016. Last Depo-Provera: 08/25/2020 . Side Effects if any: NONE. Serum HCG indicated? N/A. Depo-Provera 150 mg IM given by: Wyatt Haste, CMA. Next appointment due May 20th - June 3rd.  Patient supplied.  Clista Bernhardt, CMA (AAMA)

## 2021-02-23 ENCOUNTER — Ambulatory Visit (INDEPENDENT_AMBULATORY_CARE_PROVIDER_SITE_OTHER): Payer: Medicare Other

## 2021-02-23 ENCOUNTER — Other Ambulatory Visit: Payer: Self-pay

## 2021-02-23 DIAGNOSIS — Z3042 Encounter for surveillance of injectable contraceptive: Secondary | ICD-10-CM

## 2021-02-23 MED ORDER — MEDROXYPROGESTERONE ACETATE 150 MG/ML IM SUSP
150.0000 mg | Freq: Once | INTRAMUSCULAR | Status: AC
  Administered 2021-02-23: 150 mg via INTRAMUSCULAR

## 2021-02-23 NOTE — Progress Notes (Signed)
Date last pap: 08/08/2016 Last Depo-Provera: 11/24/2020 Side Effects if any: none Serum HCG indicated? n/a Depo-Provera 150 mg IM given by: Abby Potash, LPN Next appointment due  August 19-Sept 2, 2022.   Pt supplied medication.

## 2021-03-06 ENCOUNTER — Other Ambulatory Visit: Payer: Self-pay | Admitting: Physician Assistant

## 2021-03-06 ENCOUNTER — Other Ambulatory Visit: Payer: Self-pay | Admitting: Obstetrics and Gynecology

## 2021-03-06 DIAGNOSIS — E782 Mixed hyperlipidemia: Secondary | ICD-10-CM

## 2021-04-04 DIAGNOSIS — B351 Tinea unguium: Secondary | ICD-10-CM | POA: Diagnosis not present

## 2021-04-11 NOTE — Progress Notes (Signed)
GYNECOLOGY ANNUAL PHYSICAL EXAM PROGRESS NOTE Subjective:   Wendy Green is a 45 y.o. [place] female with mild mental delay who presents for an annual exam.  The patient has never been sexually active.  The patient wears seatbelts: yes. The patient participates in regular exercise: no. Has the patient ever been transfused or tattooed?: no. The patient reports that there is not domestic violence in her life.     The patient has the following complaints today: 1. Vaginal bleeding and spotting while on depo injection. 2. Reports being treated currently for a toenail infection     Gynecologic History  Menarche age: 60 No LMP recorded. Patient has had an injection. Contraception: Depo-Provera injections (for management of heavy menses) History of STI's: Denies. Last Pap: 08/08/2016. Results were: normal.  Denies h/o abnormal pap smears. Last mammogram: 08/21/2020 Results were: normal Dexa Scan: 11/10/2019.  Results were: normal.    OB History  Gravida Para Term Preterm AB Living  0 0 0 0 0 0  SAB IAB Ectopic Multiple Live Births  0 0 0 0 0    Past Medical History:  Diagnosis Date   Abnormal weight gain 02/24/2015   Cerebral seizure ([place]) 02/24/2015   dx at age 4.    Changeable mood 02/24/2015   Epilepsy ([place]) 05/16/2009   Episodic mood disorder ([place]) 09/11/2015   Family history of adverse reaction to anesthesia    mother has nausea and vomiting.   Headache, migraine 09/11/2015   Hematuria 06/04/2016   High cholesterol    History of kidney stones    HLD (hyperlipidemia) 05/16/2009   Intellectual disability 02/24/2015   Seborrheic dermatitis of scalp    Seizure ([place]) 09/11/2015    Past Surgical History:  Procedure Laterality Date   CYSTOSCOPY W/ RETROGRADES Bilateral 07/16/2016   Procedure: CYSTOSCOPY WITH RETROGRADE PYELOGRAM;  Surgeon: Hollice Espy, MD;  Location: [facility] ORS;  Service: Urology;  Laterality: Bilateral;   NO PAST SURGERIES      Family History  Problem  Relation Age of Onset   Heart attack Father    Stroke Maternal Grandmother    Pneumonia Maternal Grandfather    Cancer Paternal Grandfather    Prostate cancer Neg Hx    Kidney cancer Neg Hx    Breast cancer Neg Hx    Ovarian cancer Neg Hx    Colon cancer Neg Hx    Diabetes Neg Hx     Social History   Socioeconomic History   Marital status: Single    Spouse name: Not on file   Number of children: 0   Years of education: Not on file   Highest education level: 12th grade  Occupational History   Occupation: none  Tobacco Use   Smoking status: Never   Smokeless tobacco: Never  Vaping Use   Vaping Use: Never used  Substance and Sexual Activity   Alcohol use: No    Alcohol/week: 0.0 standard drinks   Drug use: No   Sexual activity: Not Currently    Birth control/protection: None, Injection  Other Topics Concern   Not on file  Social History Narrative   Not on file   Social Determinants of Health   Financial Resource Strain: Not on file  Food Insecurity: Not on file  Transportation Needs: Not on file  Physical Activity: Not on file  Stress: Not on file  Social Connections: Not on file  Intimate Partner Violence: Not on file    Current Outpatient Medications on File Prior  to Visit  Medication Sig Dispense Refill   acetaminophen (TYLENOL) 500 MG tablet Take 500 mg by mouth every 6 (six) hours as needed.     acetaZOLAMIDE (DIAMOX) 250 MG tablet TAKE 1 TABLET BY MOUTH 3 TIMES A DAY 90 tablet 3   fluticasone (FLONASE) 50 MCG/ACT nasal spray Place into both nostrils daily.     folic acid (FOLVITE) 1 MG tablet TAKE 1 TABLET BY MOUTH ONCE DAILY     KRILL OIL PO Take 1 capsule by mouth 2 (two) times daily.      medroxyPROGESTERone (DEPO-PROVERA) 150 MG/ML injection INJECT 1 ML (150 MG TOTAL) INTO THE MUSCLE EVERY 3 (THREE) MONTHS. 1 mL 0   MULTIPLE VITAMIN PO 1 tablet daily.     OXcarbazepine (TRILEPTAL PO) Take by mouth. Twice a day     simvastatin (ZOCOR) 20 MG tablet  Take 1 tablet (20 mg total) by mouth at bedtime. Please schedule an office visit before anymore refills. 30 tablet 0   Wheat Dextrin (BENEFIBER PO) Take by mouth. As needed     No current facility-administered medications on file prior to visit.    Allergies  Allergen Reactions   Aspirin      Review of Systems Constitutional: negative for chills, fatigue, fevers and sweats Eyes: negative for irritation, redness and visual disturbance Ears, nose, mouth, throat, and face: negative for hearing loss, nasal congestion, snoring and tinnitus Respiratory: negative for asthma, cough, sputum Cardiovascular: negative for chest pain, dyspnea, exertional chest pressure/discomfort, irregular heart beat, palpitations and syncope Gastrointestinal: negative for abdominal pain, change in bowel habits, nausea and vomiting Genitourinary: negative for abnormal menstrual periods, genital lesions, sexual problems and vaginal discharge, dysuria and urinary incontinence Integument/breast: negative for breast lump, breast tenderness and nipple discharge Hematologic/lymphatic: negative for bleeding and easy bruising Musculoskeletal:negative for back pain and muscle weakness Neurological: negative for dizziness, headaches, vertigo and weakness Endocrine: negative for diabetic symptoms including polydipsia, polyuria and skin dryness Allergic/Immunologic: negative for hay fever and urticaria      Objective:  Blood pressure 104/73, pulse 85, height 5' 4.25" (1.632 m), weight 172 lb 11.2 oz (78.3 kg).  Body mass index is 29.41 kg/m.  General Appearance:    Alert, cooperative, no distress, appears stated age  Head:    Normocephalic, without obvious abnormality, atraumatic  Eyes:    PERRL, conjunctiva/corneas clear, EOM's intact, both eyes  Ears:    Normal external ear canals, both ears  Nose:   Nares normal, septum midline, mucosa normal, no drainage or sinus tenderness  Throat:   Lips, mucosa, and tongue  normal; teeth and gums normal  Neck:   Supple, symmetrical, trachea midline, no adenopathy; thyroid: no enlargement/tenderness/nodules; no carotid bruit or JVD  Back:     Symmetric, no curvature, ROM normal, no CVA tenderness  Lungs:     Clear to auscultation bilaterally, respirations unlabored  Chest Wall:    No tenderness or deformity   Heart:    Regular rate and rhythm, S1 and S2 normal, no murmur, rub or gallop  Breast Exam:    No tenderness, masses, or nipple abnormality  Abdomen:     Soft, non-tender, bowel sounds active all four quadrants, no masses, no organomegaly.    Genitalia:    Pelvic:external genitalia normal, vagina without lesions, discharge, or tenderness, rectovaginal septum  normal. Cervix normal in appearance, no cervical motion tenderness, no adnexal masses or tenderness.  Uterus normal size, shape, mobile, regular contours, nontender.  Rectal:    Normal  external sphincter.  No hemorrhoids appreciated. Internal exam not done.   Extremities:   Extremities normal, atraumatic, no cyanosis or edema  Pulses:   2+ and symmetric all extremities  Skin:   Skin color, texture, turgor normal, no rashes or lesions  Lymph nodes:   Cervical, supraclavicular, and axillary nodes normal  Neurologic:   CNII-XII intact, normal strength, sensation and reflexes throughout   .  Labs:  Lab Results  Component Value Date   WBC 7.2 05/16/2020   HGB 14.4 05/16/2020   HCT 41.7 05/16/2020   MCV 97 05/16/2020   PLT 209 05/16/2020    Lab Results  Component Value Date   CREATININE 0.60 05/16/2020   BUN 10 05/16/2020   NA 139 05/16/2020   K 3.8 05/16/2020   CL 108 (H) 05/16/2020   CO2 16 (L) 05/16/2020    Lab Results  Component Value Date   ALT 25 05/16/2020   AST 19 05/16/2020   ALKPHOS 66 05/16/2020   BILITOT 0.3 05/16/2020    Lab Results  Component Value Date   TSH 4.300 05/16/2020     Assessment:   1. Encounter for well woman exam with routine gynecological exam   2.  Breast cancer screening by mammogram   3. Pap smear for cervical cancer screening   4. Depo-Provera contraceptive status   5. Long term (current) use of hormonal contraceptives    6. Overweight (BMI 25.0-29.9)   7. Generalized idiopathic epilepsy, not intractable, without status epilepticus ([place])    Plan:  Blood tests: None ordered, has labs by PCP. Breast self exam technique reviewed and patient encouraged to perform self-exam monthly. Contraception: Depo-Provera injections.  Has long term use, over 3 years.  Has had Dexa Scan performed last year, normal.  Discussed healthy lifestyle modifications. Mammogram ordered.  Pap smear ordered. Will need to begin colon cancer screening at age 83.  Seizure disorder managed by Neurologist.  COVID vaccination status: 3 vaccine completed Follow up in 1 year for annual exam   Rubie Maid, MD [facility]

## 2021-04-11 NOTE — Patient Instructions (Addendum)
Preventive Care 45-45 Years Old, Female Preventive care refers to lifestyle choices and visits with your health care provider that can promote health and wellness. This includes: A yearly physical exam. This is also called an annual wellness visit. Regular dental and eye exams. Immunizations. Screening for certain conditions. Healthy lifestyle choices, such as: Eating a healthy diet. Getting regular exercise. Not using drugs or products that contain nicotine and tobacco. Limiting alcohol use. What can I expect for my preventive care visit? Physical exam Your health care provider will check your: Height and weight. These may be used to calculate your BMI (body mass index). BMI is a measurement that tells if you are at a healthy weight. Heart rate and blood pressure. Body temperature. Skin for abnormal spots. Counseling Your health care provider may ask you questions about your: Past medical problems. Family's medical history. Alcohol, tobacco, and drug use. Emotional well-being. Home life and relationship well-being. Sexual activity. Diet, exercise, and sleep habits. Work and work Statistician. Access to firearms. Method of birth control. Menstrual cycle. Pregnancy history. What immunizations do I need?  Vaccines are usually given at various ages, according to a schedule. Your health care provider will recommend vaccines for you based on your age, medicalhistory, and lifestyle or other factors, such as travel or where you work. What tests do I need? Blood tests Lipid and cholesterol levels. These may be checked every 5 years, or more often if you are over 45 years old. Hepatitis C test. Hepatitis B test. Screening Lung cancer screening. You may have this screening every year starting at age 45 if you have a 30-pack-year history of smoking and currently smoke or have quit within the past 15 years. Colorectal cancer screening. All adults should have this screening starting at  age 45 and continuing until age 45. Your health care provider may recommend screening at age 45 if you are at increased risk. You will have tests every 1-10 years, depending on your results and the type of screening test. Diabetes screening. This is done by checking your blood sugar (glucose) after you have not eaten for a while (fasting). You may have this done every 1-3 years. Mammogram. This may be done every 1-2 years. Talk with your health care provider about when you should start having regular mammograms. This may depend on whether you have a family history of breast cancer. BRCA-related cancer screening. This may be done if you have a family history of breast, ovarian, tubal, or peritoneal cancers. Pelvic exam and Pap test. This may be done every 3 years starting at age 45. Starting at age 45, this may be done every 5 years if you have a Pap test in combination with an HPV test. Other tests STD (sexually transmitted disease) testing, if you are at risk. Bone density scan. This is done to screen for osteoporosis. You may have this scan if you are at high risk for osteoporosis. Talk with your health care provider about your test results, treatment options,and if necessary, the need for more tests. Follow these instructions at home: Eating and drinking  Eat a diet that includes fresh fruits and vegetables, whole grains, lean protein, and low-fat dairy products. Take vitamin and mineral supplements as recommended by your health care provider. Do not drink alcohol if: Your health care provider tells you not to drink. You are pregnant, may be pregnant, or are planning to become pregnant. If you drink alcohol: Limit how much you have to 0-1 drink a day. Be aware  of how much alcohol is in your drink. In the [place] one drink equals one 12 oz bottle of beer (355 mL), one 5 oz glass of wine (148 mL), or one 1 oz glass of hard liquor (44 mL).  Lifestyle Take daily care of your teeth and  gums. Brush your teeth every morning and night with fluoride toothpaste. Floss one time each day. Stay active. Exercise for at least 30 minutes 5 or more days each week. Do not use any products that contain nicotine or tobacco, such as cigarettes, e-cigarettes, and chewing tobacco. If you need help quitting, ask your health care provider. Do not use drugs. If you are sexually active, practice safe sex. Use a condom or other form of protection to prevent STIs (sexually transmitted infections). If you do not wish to become pregnant, use a form of birth control. If you plan to become pregnant, see your health care provider for a prepregnancy visit. If told by your health care provider, take low-dose aspirin daily starting at age 45. Find healthy ways to cope with stress, such as: Meditation, yoga, or listening to music. Journaling. Talking to a trusted person. Spending time with friends and family. Safety Always wear your seat belt while driving or riding in a vehicle. Do not drive: If you have been drinking alcohol. Do not ride with someone who has been drinking. When you are tired or distracted. While texting. Wear a helmet and other protective equipment during sports activities. If you have firearms in your house, make sure you follow all gun safety procedures. What's next? Visit your health care provider once a year for an annual wellness visit. Ask your health care provider how often you should have your eyes and teeth checked. Stay up to date on all vaccines. This information is not intended to replace advice given to you by your health care provider. Make sure you discuss any questions you have with your healthcare provider. Document Revised: 06/13/2020 Document Reviewed: 05/21/2018 Elsevier Patient Education  2022 [place] Breast self-awareness means being familiar with how your breasts look and feel. It involves checking your breasts regularly and  reporting any changes to yourhealth care provider. Practicing breast self-awareness is important. Sometimes changes may not be harmful (are benign), but sometimes a change in your breasts can be a sign of a serious medical problem. It is important to learn how to do this procedure correctly so that you can catch problems early, when treatment is more likely to be successful. All women should practice breast self-awareness, including women who have hadbreast implants. What you need: A mirror. A well-lit room. How to do a breast self-exam A breast self-exam is one way to learn what is normal for your breasts andwhether your breasts are changing. To do a breast self-exam: Look for changes  Remove all the clothing above your waist. Stand in front of a mirror in a room with good lighting. Put your hands on your hips. Push your hands firmly downward. Compare your breasts in the mirror. Look for differences between them (asymmetry), such as: Differences in shape. Differences in size. Puckers, dips, and bumps in one breast and not the other. Look at each breast for changes in the skin, such as: Redness. Scaly areas. Look for changes in your nipples, such as: Discharge. Bleeding. Dimpling. Redness. A change in position.  Feel for changes Carefully feel your breasts for lumps and changes. It is best to do this while lying on your back  on the floor, and again while sitting or standing in the tub or shower with soapy water on your skin. Feel each breast in the following way: Place the arm on the side of the breast you are examining above your head. Feel your breast with the other hand. Start in the nipple area and make -inch (2 cm) overlapping circles to feel your breast. Use the pads of your three middle fingers to do this. Apply light pressure, then medium pressure, then firm pressure. The light pressure will allow you to feel the tissue closest to the skin. The medium pressure will allow you  to feel the tissue that is a little deeper. The firm pressure will allow you to feel the tissue close to the ribs. Continue the overlapping circles, moving downward over the breast until you feel your ribs below your breast. Move one finger-width toward the center of the body. Continue to use the -inch (2 cm) overlapping circles to feel your breast as you move slowly up toward your collarbone. Continue the up-and-down exam using all three pressures until you reach your armpit.  Write down what you find Writing down what you find can help you remember what to discuss with your health care provider. Write down: What is normal for each breast. Any changes that you find in each breast, including: The kind of changes you find. Any pain or tenderness. Size and location of any lumps. Where you are in your menstrual cycle, if you are still menstruating. General tips and recommendations Examine your breasts every month. If you are breastfeeding, the best time to examine your breasts is after a feeding or after using a breast pump. If you menstruate, the best time to examine your breasts is 5-7 days after your period. Breasts are generally lumpier during menstrual periods, and it may be more difficult to notice changes. With time and practice, you will become more familiar with the variations in your breasts and more comfortable with the exam. Contact a health care provider if you: See a change in the shape or size of your breasts or nipples. See a change in the skin of your breast or nipples, such as a reddened or scaly area. Have unusual discharge from your nipples. Find a lump or thick area that was not there before. Have pain in your breasts. Have any concerns related to your breast health. Summary Breast self-awareness includes looking for physical changes in your breasts, as well as feeling for any changes within your breasts. Breast self-awareness should be performed in front of a mirror in  a well-lit room. You should examine your breasts every month. If you menstruate, the best time to examine your breasts is 5-7 days after your menstrual period. Let your health care provider know of any changes you notice in your breasts, including changes in size, changes on the skin, pain or tenderness, or unusual fluid from your nipples. This information is not intended to replace advice given to you by your health care provider. Make sure you discuss any questions you have with your healthcare provider. Document Revised: 04/28/2018 Document Reviewed: 04/28/2018 Elsevier Patient Education  [place].

## 2021-04-12 ENCOUNTER — Encounter: Payer: Medicare Other | Admitting: Obstetrics and Gynecology

## 2021-04-13 ENCOUNTER — Ambulatory Visit (INDEPENDENT_AMBULATORY_CARE_PROVIDER_SITE_OTHER): Payer: Medicare Other | Admitting: Obstetrics and Gynecology

## 2021-04-13 ENCOUNTER — Encounter: Payer: Self-pay | Admitting: Obstetrics and Gynecology

## 2021-04-13 ENCOUNTER — Other Ambulatory Visit (HOSPITAL_COMMUNITY)
Admission: RE | Admit: 2021-04-13 | Discharge: 2021-04-13 | Disposition: A | Discharge status: Home / Self Care | Payer: Medicare Other | Source: Ambulatory Visit | Attending: Obstetrics and Gynecology | Admitting: Obstetrics and Gynecology

## 2021-04-13 ENCOUNTER — Encounter: Payer: Medicare Other | Admitting: Obstetrics and Gynecology

## 2021-04-13 ENCOUNTER — Other Ambulatory Visit: Payer: Self-pay

## 2021-04-13 VITALS — BP 104/73 | HR 85 | Ht 64.25 in | Wt 172.7 lb

## 2021-04-13 DIAGNOSIS — Z1231 Encounter for screening mammogram for malignant neoplasm of breast: Secondary | ICD-10-CM

## 2021-04-13 DIAGNOSIS — Z793 Long term (current) use of hormonal contraceptives: Secondary | ICD-10-CM | POA: Diagnosis not present

## 2021-04-13 DIAGNOSIS — E663 Overweight: Secondary | ICD-10-CM

## 2021-04-13 DIAGNOSIS — Z01419 Encounter for gynecological examination (general) (routine) without abnormal findings: Secondary | ICD-10-CM

## 2021-04-13 DIAGNOSIS — Z1151 Encounter for screening for human papillomavirus (HPV): Secondary | ICD-10-CM | POA: Insufficient documentation

## 2021-04-13 DIAGNOSIS — G40309 Generalized idiopathic epilepsy and epileptic syndromes, not intractable, without status epilepticus: Secondary | ICD-10-CM

## 2021-04-13 DIAGNOSIS — Z124 Encounter for screening for malignant neoplasm of cervix: Secondary | ICD-10-CM | POA: Insufficient documentation

## 2021-04-13 DIAGNOSIS — Z3042 Encounter for surveillance of injectable contraceptive: Secondary | ICD-10-CM | POA: Diagnosis not present

## 2021-04-13 MED ORDER — MEDROXYPROGESTERONE ACETATE 150 MG/ML IM SUSP: 150.0000 mg | INTRAMUSCULAR | 3 refills | Status: DC

## 2021-04-20 LAB — CYTOLOGY - PAP
Adequacy: ABSENT
Comment: NEGATIVE
Diagnosis: NEGATIVE
High risk HPV: NEGATIVE

## 2021-05-29 ENCOUNTER — Other Ambulatory Visit: Payer: Self-pay

## 2021-05-29 ENCOUNTER — Ambulatory Visit (INDEPENDENT_AMBULATORY_CARE_PROVIDER_SITE_OTHER): Payer: Medicare Other | Admitting: Obstetrics and Gynecology

## 2021-05-29 DIAGNOSIS — Z3042 Encounter for surveillance of injectable contraceptive: Secondary | ICD-10-CM | POA: Diagnosis not present

## 2021-05-29 MED ORDER — MEDROXYPROGESTERONE ACETATE 150 MG/ML IM SUSP
150.0000 mg | Freq: Once | INTRAMUSCULAR | Status: AC
  Administered 2021-05-29: 150 mg via INTRAMUSCULAR

## 2021-05-29 NOTE — Progress Notes (Signed)
Pt presents for routine depo injection. Pt verbalized no questions/concerns. No adverse reactions.

## 2021-06-19 ENCOUNTER — Telehealth: Payer: Self-pay | Admitting: Obstetrics and Gynecology

## 2021-06-19 NOTE — Telephone Encounter (Signed)
Pt's mother called with question about a recent bill./ Pt was seen on 9-06 and received her depo injection, pt mother states that they got charged for both the visit and the medication- she states that they pay for the medication at pharmacy and bring with them to their appointment. Please Advise.   I made patient's mother aware that you Practice Admin was out of office this week and would look into this.

## 2021-06-23 ENCOUNTER — Other Ambulatory Visit: Payer: Self-pay | Admitting: Family Medicine

## 2021-06-23 DIAGNOSIS — E782 Mixed hyperlipidemia: Secondary | ICD-10-CM

## 2021-06-23 NOTE — Telephone Encounter (Signed)
Requested medication (s) are due for refill today: yes  Requested medication (s) are on the active medication list: yes  Last refill:  03/08/21 #30  Future visit scheduled: called pt's mother and she stated that "the office is working to get her daughter an appt"- offered to make appt- but mother decided to wait to hear who her PCP will be  Notes to clinic:  see above   Requested Prescriptions  Pending Prescriptions Disp Refills   simvastatin (ZOCOR) 20 MG tablet [Pharmacy Med Name: SIMVASTATIN 20 MG TABLET] 30 tablet 0    Sig: TAKE 1 TABLET BY MOUTH AT BEDTIME. PLEASE SCHEDULE AN OFFICE VISIT BEFORE ANYMORE REFILLS.     Cardiovascular:  Antilipid - Statins Failed - 06/23/2021 11:06 AM      Failed - Total Cholesterol in normal range and within 360 days    Cholesterol, Total  Date Value Ref Range Status  05/16/2020 181 100 - 199 mg/dL Final          Failed - LDL in normal range and within 360 days    LDL Cholesterol (Calc)  Date Value Ref Range Status  06/19/2017 115 (H) mg/dL (calc) Final    Comment:    Reference range: <100 . Desirable range <100 mg/dL for primary prevention;   <70 mg/dL for patients with CHD or diabetic patients  with > or = 2 CHD risk factors. Marland Kitchen LDL-C is now calculated using the Martin-Hopkins  calculation, which is a validated novel method providing  better accuracy than the Friedewald equation in the  estimation of LDL-C.  Cresenciano Genre et al. Annamaria Helling. 3614;431(54): 2061-2068  (http://education.[employer].com/faq/FAQ164)    LDL Chol Calc (NIH)  Date Value Ref Range Status  05/16/2020 97 0 - 99 mg/dL Final          Failed - HDL in normal range and within 360 days    HDL  Date Value Ref Range Status  05/16/2020 59 >39 mg/dL Final          Failed - Triglycerides in normal range and within 360 days    Triglycerides  Date Value Ref Range Status  05/16/2020 143 0 - 149 mg/dL Final          Failed - Valid encounter within last 12 months     Recent Outpatient Visits           2 years ago Nonintractable generalized idiopathic epilepsy without status epilepticus [facility])   [place], Vickki Muff, [name]   2 years ago Acute otitis externa of left ear, unspecified type   [place], [place]   3 years ago Annual physical exam   [facility], Vickki Muff, [name]   3 years ago Mixed hyperlipidemia   [facility], Vickki Muff, [name]   3 years ago [place], Vickki Muff, [place]              Passed - Patient is not pregnant

## 2021-06-29 NOTE — Telephone Encounter (Signed)
Pts Mom aware I have emailed (kyndal) billing to have the depo removed.   She is aware it may take 45 days to cycle through.

## 2021-07-02 NOTE — Addendum Note (Signed)
Addended by: Matilde Sprang on: 07/02/2021 06:15 PM   Modules accepted: Orders

## 2021-07-02 NOTE — Telephone Encounter (Signed)
Pt has one pill of simvastatin (ZOCOR) 20 MG tablet left and would like a courtesy refill for enough to get her to her 10.21.22 appt/ please advise and send to [facility] [address]

## 2021-07-03 NOTE — Telephone Encounter (Signed)
Requested medication (s) are due for refill today: Yes  Requested medication (s) are on the active medication list: Yes  Last refill:  03/08/21  Future visit scheduled: Yes  Notes to clinic:  Protocol indicates pt. Needs lab work.    Requested Prescriptions  Pending Prescriptions Disp Refills   simvastatin (ZOCOR) 20 MG tablet 30 tablet 0    Sig: Take 1 tablet (20 mg total) by mouth at bedtime. Please schedule an office visit before anymore refills.     Cardiovascular:  Antilipid - Statins Failed - 07/02/2021  6:15 PM      Failed - Total Cholesterol in normal range and within 360 days    Cholesterol, Total  Date Value Ref Range Status  05/16/2020 181 100 - 199 mg/dL Final          Failed - LDL in normal range and within 360 days    LDL Cholesterol (Calc)  Date Value Ref Range Status  06/19/2017 115 (H) mg/dL (calc) Final    Comment:    Reference range: <100 . Desirable range <100 mg/dL for primary prevention;   <70 mg/dL for patients with CHD or diabetic patients  with > or = 2 CHD risk factors. Marland Kitchen LDL-C is now calculated using the Martin-Hopkins  calculation, which is a validated novel method providing  better accuracy than the Friedewald equation in the  estimation of LDL-C.  Cresenciano Genre et al. Annamaria Helling. 5885;027(74): 2061-2068  (http://education.[facility].com/faq/FAQ164)    LDL Chol Calc (NIH)  Date Value Ref Range Status  05/16/2020 97 0 - 99 mg/dL Final          Failed - HDL in normal range and within 360 days    HDL  Date Value Ref Range Status  05/16/2020 59 >39 mg/dL Final          Failed - Triglycerides in normal range and within 360 days    Triglycerides  Date Value Ref Range Status  05/16/2020 143 0 - 149 mg/dL Final          Failed - Valid encounter within last 12 months    Recent Outpatient Visits           2 years ago Nonintractable generalized idiopathic epilepsy without status epilepticus [facility])   [employer],  Vickki Muff, [name]   2 years ago Acute otitis externa of left ear, unspecified type   [place], [place], [place]   3 years ago Annual physical exam   [employer], Vickki Muff, [name]   3 years ago Mixed hyperlipidemia   [employer], Vickki Muff, [name]   3 years ago [place], [name]              Passed - Patient is not pregnant      Refused Prescriptions Disp Refills   simvastatin (ZOCOR) 20 MG tablet [Pharmacy Med Name: SIMVASTATIN 20 MG TABLET] 30 tablet 0    Sig: TAKE 1 TABLET BY MOUTH AT BEDTIME. PLEASE SCHEDULE AN OFFICE VISIT BEFORE ANYMORE REFILLS.     Cardiovascular:  Antilipid - Statins Failed - 07/02/2021  6:15 PM      Failed - Total Cholesterol in normal range and within 360 days    Cholesterol, Total  Date Value Ref Range Status  05/16/2020 181 100 - 199 mg/dL Final          Failed - LDL in normal range and within 360 days  LDL Cholesterol (Calc)  Date Value Ref Range Status  06/19/2017 115 (H) mg/dL (calc) Final    Comment:    Reference range: <100 . Desirable range <100 mg/dL for primary prevention;   <70 mg/dL for patients with CHD or diabetic patients  with > or = 2 CHD risk factors. Marland Kitchen LDL-C is now calculated using the Martin-Hopkins  calculation, which is a validated novel method providing  better accuracy than the Friedewald equation in the  estimation of LDL-C.  Cresenciano Genre et al. Annamaria Helling. 4097;353(29): 2061-2068  (http://education.[facility].com/faq/FAQ164)    LDL Chol Calc (NIH)  Date Value Ref Range Status  05/16/2020 97 0 - 99 mg/dL Final          Failed - HDL in normal range and within 360 days    HDL  Date Value Ref Range Status  05/16/2020 59 >39 mg/dL Final          Failed - Triglycerides in normal range and within 360 days    Triglycerides  Date Value Ref Range Status  05/16/2020 143 0 - 149 mg/dL Final          Failed  - Valid encounter within last 12 months    Recent Outpatient Visits           2 years ago Nonintractable generalized idiopathic epilepsy without status epilepticus [facility])   [place], Vickki Muff, [name]   2 years ago Acute otitis externa of left ear, unspecified type   [place], [place]   3 years ago Annual physical exam   [employer], Vickki Muff, [name]   3 years ago Mixed hyperlipidemia   [employer], Vickki Muff, [name]   3 years ago [place], Vickki Muff, [place]              Passed - Patient is not pregnant

## 2021-07-04 ENCOUNTER — Telehealth: Payer: Self-pay

## 2021-07-04 DIAGNOSIS — E782 Mixed hyperlipidemia: Secondary | ICD-10-CM

## 2021-07-04 MED ORDER — SIMVASTATIN 20 MG PO TABS: 20.0000 mg | ORAL_TABLET | Freq: Every day | ORAL | 0 refills | Status: DC

## 2021-07-04 NOTE — Telephone Encounter (Signed)
Patient has been out of her Simvastatin for a week now. Mother put in refill request 06/23/21 and has not heard anything.   Patient has appt for CPE on 07/13/21 with Ria Comment.   Can you please call in 1 months worth so she wont have to go without for 3 weeks?  [facility] [address]

## 2021-07-04 NOTE — Telephone Encounter (Signed)
Rx was sent to pharmacy. 

## 2021-07-13 ENCOUNTER — Ambulatory Visit (INDEPENDENT_AMBULATORY_CARE_PROVIDER_SITE_OTHER): Payer: Medicare Other | Admitting: Physician Assistant

## 2021-07-13 ENCOUNTER — Ambulatory Visit
Admission: RE | Admit: 2021-07-13 | Discharge: 2021-07-13 | Disposition: A | Discharge status: Home / Self Care | Payer: Medicare Other | Attending: Physician Assistant | Admitting: Physician Assistant

## 2021-07-13 ENCOUNTER — Other Ambulatory Visit: Payer: Self-pay

## 2021-07-13 ENCOUNTER — Encounter: Payer: Self-pay | Admitting: Physician Assistant

## 2021-07-13 ENCOUNTER — Ambulatory Visit
Admission: RE | Admit: 2021-07-13 | Discharge: 2021-07-13 | Disposition: A | Discharge status: Home / Self Care | Payer: Medicare Other | Source: Ambulatory Visit | Attending: Physician Assistant | Admitting: Physician Assistant

## 2021-07-13 ENCOUNTER — Encounter: Payer: Medicare Other | Admitting: Family Medicine

## 2021-07-13 VITALS — BP 115/77 | HR 101 | Temp 97.2°F | Resp 16 | Wt 172.7 lb

## 2021-07-13 DIAGNOSIS — Z0000 Encounter for general adult medical examination without abnormal findings: Secondary | ICD-10-CM | POA: Diagnosis not present

## 2021-07-13 DIAGNOSIS — Z23 Encounter for immunization: Secondary | ICD-10-CM | POA: Diagnosis not present

## 2021-07-13 DIAGNOSIS — E782 Mixed hyperlipidemia: Secondary | ICD-10-CM | POA: Diagnosis not present

## 2021-07-13 DIAGNOSIS — R519 Headache, unspecified: Secondary | ICD-10-CM | POA: Diagnosis not present

## 2021-07-13 DIAGNOSIS — G40309 Generalized idiopathic epilepsy and epileptic syndromes, not intractable, without status epilepticus: Secondary | ICD-10-CM | POA: Diagnosis not present

## 2021-07-13 DIAGNOSIS — W1830XA Fall on same level, unspecified, initial encounter: Secondary | ICD-10-CM | POA: Diagnosis not present

## 2021-07-13 MED ORDER — SIMVASTATIN 20 MG PO TABS: 20.0000 mg | ORAL_TABLET | Freq: Every day | ORAL | 0 refills | Status: DC

## 2021-07-13 NOTE — Assessment & Plan Note (Signed)
Continue monitoring med levels, Dr. Melrose Nakayama manages medications.

## 2021-07-13 NOTE — Addendum Note (Signed)
Addended byMikey Kirschner on: 07/13/2021 11:34 AM   Modules accepted: Orders

## 2021-07-13 NOTE — Progress Notes (Addendum)
AddendumAnderson Malta had a mechanical fall outside the office after our encounter. Her mother was with her as well as a nurse from another office, who witnessed the fall. Her mother states she was holding her arm, and then suddenly Gale tripped and fell right on her face. Reports not using her arms to brace her fall. Reports she started bleeding from her face so they came back upstairs to be evaluated. Reports she was bleeding from her mouth.   Zamira denies any LOC, dizziness, vision changes. Denies any pain to extremities. Able to ambulate after fall.  PE: Pt was initially crying, but soothed, NAD. Abrasions to right philtrum, right face below eye, and right side of forehead. No lacerations or active bleeding. Very minimal blood in mouth, front teeth intact and not loose, tongue without laceration. No tenderness to nose, no periocular tenderness, no forehead tenderness. PEERL, extra ocular movements intact.  Encouraged patient initially to go to the ED for evaluation after fall, pt declined. Initially resistant to outpatient imaging to evaluate for facial fracture.  Pt was evaluated by my supervising physician as well, Dr. Brita Romp, and she was encouraged and agreed to get imaging, accompanied by her mother.  Will reach out to patient after STAT imaging to r/o facial fracture. Advised tylenol for pain and ice for bruising/edema.    Annual Wellness Visit     Patient: Wendy Green, Female    DOB: [birth year], 45 y.o.   MRN: [id number] Visit Date: 07/13/2021  Today's Provider: [name], [name]   Chief Complaint  Patient presents with   Medicare Wellness   Subjective      HPI GEORGIA DELSIGNORE is a 45 y.o. female who presents today for her Annual Wellness Visit. She reports consuming a general diet. The patient does not participate in regular exercise at present. She generally feels well. She reports sleeping well. She does not have additional problems to discuss  today. She presents today with a family member.  Last Reported Pap-04/13/21 HPV negative Mammogram- ordered by Dr. Vivien Rota on 04/13/21   Medications: Outpatient Medications Prior to Visit  Medication Sig   acetaminophen (TYLENOL) 500 MG tablet Take 500 mg by mouth every 6 (six) hours as needed.   acetaZOLAMIDE (DIAMOX) 250 MG tablet TAKE 1 TABLET BY MOUTH 3 TIMES A DAY   fluconazole (DIFLUCAN) 200 MG tablet Take 200 mg by mouth once a week.   fluticasone (FLONASE) 50 MCG/ACT nasal spray Place into both nostrils daily.   folic acid (FOLVITE) 1 MG tablet TAKE 1 TABLET BY MOUTH ONCE DAILY   KRILL OIL PO Take 1 capsule by mouth 2 (two) times daily.    medroxyPROGESTERone (DEPO-PROVERA) 150 MG/ML injection Inject 1 mL (150 mg total) into the muscle every 3 (three) months.   MULTIPLE VITAMIN PO 1 tablet daily.   OXcarbazepine (TRILEPTAL PO) Take by mouth. Twice a day   primidone (MYSOLINE) 250 MG tablet Take by mouth.   Wheat Dextrin (BENEFIBER PO) Take by mouth. As needed   [DISCONTINUED] simvastatin (ZOCOR) 20 MG tablet Take 1 tablet (20 mg total) by mouth at bedtime. Please schedule an office visit before anymore refills.   No facility-administered medications prior to visit.    Allergies  Allergen Reactions   Aspirin     Patient Care Team: [name], [name] as PCP - General (Physician Assistant) Rubie Maid, MD as Referring Physician (Obstetrics and Gynecology) Anabel Bene, MD as Referring Physician (Neurology) Pa, [facility] as Consulting Physician (Dermatology)  Review  of Systems  Constitutional: Negative.   Eyes: Negative.   Cardiovascular: Negative.   Gastrointestinal: Negative.   Endocrine: Negative.   Genitourinary: Negative.   Musculoskeletal: Negative.   Skin: Negative.   Allergic/Immunologic: Negative.   Neurological: Negative.   Hematological: Negative.   Psychiatric/Behavioral: Negative.    All other systems reviewed and are negative.     Objective    Vitals: BP 115/77   Pulse (!) 101   Temp (!) 97.2 F (36.2 C) (Oral)   Resp 16   Wt 172 lb 11.2 oz (78.3 kg)   SpO2 97%   BMI 29.41 kg/m   Physical Exam Constitutional:      General: She is awake.     Appearance: She is well-developed.  HENT:     Head: Normocephalic.     Right Ear: Tympanic membrane normal.     Left Ear: Tympanic membrane normal.  Eyes:     Conjunctiva/sclera: Conjunctivae normal.     Pupils: Pupils are equal, round, and reactive to light.  Neck:     Thyroid: No thyroid mass or thyromegaly.  Cardiovascular:     Rate and Rhythm: Normal rate and regular rhythm.     Heart sounds: Normal heart sounds.  Pulmonary:     Effort: Pulmonary effort is normal.     Breath sounds: Normal breath sounds.  Abdominal:     Palpations: Abdomen is soft.     Tenderness: There is no abdominal tenderness.  Musculoskeletal:     Right lower leg: No swelling.     Left lower leg: No swelling.  Lymphadenopathy:     Cervical: No cervical adenopathy.  Skin:    General: Skin is warm.  Neurological:     Mental Status: She is alert and oriented to person, place, and time.  Psychiatric:        Attention and Perception: Attention normal.        Mood and Affect: Mood normal.        Speech: Speech normal.        Behavior: Behavior is cooperative.    Most recent functional status assessment: In your present state of health, do you have any difficulty performing the following activities: 07/13/2021  Hearing? N  Vision? Y  Difficulty concentrating or making decisions? N  Walking or climbing stairs? Y  Dressing or bathing? N  Doing errands, shopping? Y  Some recent data might be hidden   Most recent fall risk assessment: Fall Risk  07/13/2021  Falls in the past year? 0  Comment -  Number falls in past yr: 0  Injury with Fall? 0  Follow up -    Most recent depression screenings: PHQ 2/9 Scores 07/13/2021 03/20/2020  PHQ - 2 Score 0 0  PHQ- 9 Score 0 -   Most  recent cognitive screening: No flowsheet data found. Most recent Audit-C alcohol use screening Alcohol Use Disorder Test (AUDIT) 07/13/2021  1. How often do you have a drink containing alcohol? 0  2. How many drinks containing alcohol do you have on a typical day when you are drinking? 0  3. How often do you have six or more drinks on one occasion? 0  AUDIT-C Score 0  Alcohol Brief Interventions/Follow-up -   A score of 3 or more in women, and 4 or more in men indicates increased risk for alcohol abuse, EXCEPT if all of the points are from question 1   No results found for any visits on 07/13/21.  Assessment &  Plan     Annual wellness visit done today including the all of the following: Reviewed patient's Family Medical History Reviewed and updated list of patient's medical providers Assessment of cognitive impairment was done Assessed patient's functional ability Established a written schedule for health screening [place] Completed and Reviewed  Exercise Activities and Dietary recommendations  Goals      DIET - INCREASE WATER INTAKE     Recommend increasing water intake to 4-6 glasses a day.      LIFESTYLE - DECREASE FALLS RISK     Recommend to remove any items from the home that may cause slips or trips.        Immunization History  Administered Date(s) Administered   Influenza,inj,Quad PF,6+ Mos 06/06/2016, 06/17/2017, 07/29/2018, 06/22/2019, 05/15/2020, 07/13/2021   PFIZER(Purple Top)SARS-COV-2 Vaccination 12/21/2019, 01/11/2020, 07/21/2020   Tdap 07/22/2011    Health Maintenance  Topic Date Due   COVID-19 Vaccine (4 - Booster for Pfizer series) 09/15/2020   COLONOSCOPY (Pts 45-3yrs Insurance coverage will need to be confirmed)  Never done   TETANUS/TDAP  07/21/2021   PAP SMEAR-Modifier  04/13/2026   INFLUENZA VACCINE  Completed   HIV Screening  Completed   Pneumococcal Vaccine 85-59 Years old  Aged Out   HPV VACCINES  Aged Out      Discussed health benefits of physical activity, and encouraged her to engage in regular exercise appropriate for her age and condition. We discussed adding in leafy greens/cruciferous veggies to her diet and increasing physical activity. Discussed taking longer outings and adding walks when going to [employer], grocery store, etc.     Problem List Items Addressed This Visit       Nervous and Auditory   Generalized idiopathic epilepsy, not intractable, without status epilepticus ([place])    Continue monitoring med levels, Dr. Melrose Nakayama manages medications.      Relevant Medications   primidone (MYSOLINE) 250 MG tablet   RESOLVED: Epilepsy ([place])   Relevant Medications   primidone (MYSOLINE) 250 MG tablet   Other Relevant Orders   CBC   10-Hydroxycarbazepine   Phenobarbital level     Other   Hyperlipidemia, unspecified    Chronic and stable on medication Check lipids       Relevant Medications   simvastatin (ZOCOR) 20 MG tablet   Other Relevant Orders   CBC   Comprehensive metabolic panel   Lipid panel   Other Visit Diagnoses     Encounter for physical examination    -  Primary   Relevant Orders   CBC   Comprehensive metabolic panel   Lipid panel   Need for influenza vaccination       Relevant Orders   Flu Vaccine QUAD 70mo+IM (Fluarix, Fluzone & Alfiuria Quad PF) (Completed)   Fall from ground level       Relevant Orders   DG Facial Bones Complete      GYN ordered mammography screening and colonoscopy screening. Pt and her guardian will schedule within the year.  Return in about 1 year (around 07/13/2022) for CPE.     I, [name], [name] have reviewed all documentation for this visit. The documentation on  07/13/2021 or the exam, diagnosis, procedures, and orders are all accurate and complete.    [name], [name]  [facility] [phone] (phone) [phone] (fax)  [place]

## 2021-07-13 NOTE — Addendum Note (Signed)
Addended by: Minette Headland on: 07/13/2021 10:43 AM   Modules accepted: Orders

## 2021-07-13 NOTE — Progress Notes (Signed)
Patients mother has been advised. KW

## 2021-07-13 NOTE — Assessment & Plan Note (Signed)
Chronic and stable on medication Check lipids

## 2021-07-17 LAB — COMPREHENSIVE METABOLIC PANEL
ALT: 25 IU/L (ref 0–32)
AST: 14 IU/L (ref 0–40)
Albumin/Globulin Ratio: 1.5 (ref 1.2–2.2)
Albumin: 4.3 g/dL (ref 3.8–4.8)
Alkaline Phosphatase: 81 IU/L (ref 44–121)
BUN/Creatinine Ratio: 13 (ref 9–23)
BUN: 8 mg/dL (ref 6–24)
Bilirubin Total: <0.2 mg/dL (ref 0.0–1.2)
CO2: 19 mmol/L — ABNORMAL LOW (ref 20–29)
Calcium: 9.9 mg/dL (ref 8.7–10.2)
Chloride: 110 mmol/L — ABNORMAL HIGH (ref 96–106)
Creatinine, Ser: 0.61 mg/dL (ref 0.57–1.00)
Globulin, Total: 2.8 g/dL (ref 1.5–4.5)
Glucose: 99 mg/dL (ref 70–99)
Potassium: 3.9 mmol/L (ref 3.5–5.2)
Sodium: 141 mmol/L (ref 134–144)
Total Protein: 7.1 g/dL (ref 6.0–8.5)
eGFR: 112 mL/min/{1.73_m2} (ref >59)

## 2021-07-17 LAB — LIPID PANEL
Chol/HDL Ratio: 3.5 ratio (ref 0.0–4.4)
Cholesterol, Total: 193 mg/dL (ref 100–199)
HDL: 55 mg/dL (ref >39)
LDL Chol Calc (NIH): 110 mg/dL — ABNORMAL HIGH (ref 0–99)
Triglycerides: 158 mg/dL — ABNORMAL HIGH (ref 0–149)
VLDL Cholesterol Cal: 28 mg/dL (ref 5–40)

## 2021-07-17 LAB — 10-HYDROXYCARBAZEPINE: Oxcarbazepine SerPl-Mcnc: 3 ug/mL — ABNORMAL LOW (ref 10–35)

## 2021-07-17 LAB — CBC
Hematocrit: 43.5 % (ref 34.0–46.6)
Hemoglobin: 14.5 g/dL (ref 11.1–15.9)
MCH: 32.3 pg (ref 26.6–33.0)
MCHC: 33.3 g/dL (ref 31.5–35.7)
MCV: 97 fL (ref 79–97)
Platelets: 210 10*3/uL (ref 150–450)
RBC: 4.49 x10E6/uL (ref 3.77–5.28)
RDW: 12.4 % (ref 11.7–15.4)
WBC: 6 10*3/uL (ref 3.4–10.8)

## 2021-07-17 LAB — PHENOBARBITAL LEVEL: Phenobarbital, Serum: 24 ug/mL (ref 15–40)

## 2021-07-27 ENCOUNTER — Other Ambulatory Visit: Payer: Self-pay | Admitting: Family Medicine

## 2021-07-27 DIAGNOSIS — E782 Mixed hyperlipidemia: Secondary | ICD-10-CM

## 2021-07-28 NOTE — Telephone Encounter (Signed)
Requested Prescriptions  Pending Prescriptions Disp Refills  . simvastatin (ZOCOR) 20 MG tablet [Pharmacy Med Name: SIMVASTATIN 20 MG TABLET] 90 tablet 3    Sig: TAKE 1 TABLET BY MOUTH AT BEDTIME. PLEASE SCHEDULE AN OFFICE VISIT BEFORE ANYMORE REFILLS.     Cardiovascular:  Antilipid - Statins Failed - 07/27/2021 12:31 PM      Failed - LDL in normal range and within 360 days    LDL Cholesterol (Calc)  Date Value Ref Range Status  06/19/2017 115 (H) mg/dL (calc) Final    Comment:    Reference range: <100 . Desirable range <100 mg/dL for primary prevention;   <70 mg/dL for patients with CHD or diabetic patients  with > or = 2 CHD risk factors. Marland Kitchen LDL-C is now calculated using the Martin-Hopkins  calculation, which is a validated novel method providing  better accuracy than the Friedewald equation in the  estimation of LDL-C.  Cresenciano Genre et al. Annamaria Helling. 7342;876(81): 2061-2068  (http://education.[facility].com/faq/FAQ164)    LDL Chol Calc (NIH)  Date Value Ref Range Status  07/13/2021 110 (H) 0 - 99 mg/dL Final         Failed - Triglycerides in normal range and within 360 days    Triglycerides  Date Value Ref Range Status  07/13/2021 158 (H) 0 - 149 mg/dL Final         Passed - Total Cholesterol in normal range and within 360 days    Cholesterol, Total  Date Value Ref Range Status  07/13/2021 193 100 - 199 mg/dL Final         Passed - HDL in normal range and within 360 days    HDL  Date Value Ref Range Status  07/13/2021 55 >39 mg/dL Final         Passed - Patient is not pregnant      Passed - Valid encounter within last 12 months    Recent Outpatient Visits          2 years ago Nonintractable generalized idiopathic epilepsy without status epilepticus [facility])   [employer], Vickki Muff, [name]   2 years ago Acute otitis externa of left ear, unspecified type   [place], [place], [place]   3 years ago Annual physical exam    [employer], Vickki Muff, [name]   3 years ago Mixed hyperlipidemia   [employer], Vickki Muff, [name]   3 years ago [place], Vickki Muff, [place]

## 2021-08-08 ENCOUNTER — Ambulatory Visit: Payer: Self-pay | Admitting: *Deleted

## 2021-08-08 NOTE — Telephone Encounter (Signed)
Summary: cold symptoms   Patient experiencing coughing, head aches, congestion for 4 days  seeking clinical advice     Call to patient's mother- patient is with her and able to help answer questions. Patient  and mother both have respiratory symptoms: cough, congestion, headache. Symptoms started Sunday and have gotten worse. Patient is treating with cough drops and tussin. Advised mother- COVID home test to rule that out as reason for symptoms- please call office if + COVID result. Advised per disposition- appointment needed- no open slot in office- will send note. Advised UC for symptoms if office does not have appointment. Reason for Disposition  [1] Continuous (nonstop) coughing interferes with work or school AND [2] no improvement using cough treatment per Care Advice  Answer Assessment - Initial Assessment Questions 1. ONSET: "When did the cough begin?"      Sunday- gotten worse 2. SEVERITY: "How bad is the cough today?"      Coughing at night- and during day 3. SPUTUM: "Describe the color of your sputum" (none, dry cough; clear, white, yellow, green)     No sputum 4. HEMOPTYSIS: "Are you coughing up any blood?" If so ask: "How much?" (flecks, streaks, tablespoons, etc.)     Blood in nasal discharge- since Sunday 5. DIFFICULTY BREATHING: "Are you having difficulty breathing?" If Yes, ask: "How bad is it?" (e.g., mild, moderate, severe)    - MILD: No SOB at rest, mild SOB with walking, speaks normally in sentences, can lie down, no retractions, pulse < 100.    - MODERATE: SOB at rest, SOB with minimal exertion and prefers to sit, cannot lie down flat, speaks in phrases, mild retractions, audible wheezing, pulse 100-120.    - SEVERE: Very SOB at rest, speaks in single words, struggling to breathe, sitting hunched forward, retractions, pulse > 120      no 6. FEVER: "Do you have a fever?" If Yes, ask: "What is your temperature, how was it measured, and when did it start?"     No fever 7.  CARDIAC HISTORY: "Do you have any history of heart disease?" (e.g., heart attack, congestive heart failure)      no 8. LUNG HISTORY: "Do you have any history of lung disease?"  (e.g., pulmonary embolus, asthma, emphysema)     no 9. PE RISK FACTORS: "Do you have a history of blood clots?" (or: recent major surgery, recent prolonged travel, bedridden)     no 10. OTHER SYMPTOMS: "Do you have any other symptoms?" (e.g., runny nose, wheezing, chest pain)       Nasal congestion, headache 11. PREGNANCY: "Is there any chance you are pregnant?" "When was your last menstrual period?"       No- Depo Provera 12. TRAVEL: "Have you traveled out of the country in the last month?" (e.g., travel history, exposures)       na  Protocols used: Cough - Acute Non-Productive-A-AH

## 2021-08-15 ENCOUNTER — Ambulatory Visit: Payer: Medicare Other

## 2021-08-20 ENCOUNTER — Other Ambulatory Visit: Payer: Self-pay

## 2021-08-20 ENCOUNTER — Ambulatory Visit (INDEPENDENT_AMBULATORY_CARE_PROVIDER_SITE_OTHER): Payer: Medicare Other | Admitting: Obstetrics and Gynecology

## 2021-08-20 DIAGNOSIS — Z3042 Encounter for surveillance of injectable contraceptive: Secondary | ICD-10-CM

## 2021-08-20 MED ORDER — MEDROXYPROGESTERONE ACETATE 150 MG/ML IM SUSP
150.0000 mg | INTRAMUSCULAR | Status: DC
  Administered 2021-08-20 – 2022-07-24 (×3): 150 mg via INTRAMUSCULAR

## 2021-08-20 NOTE — Progress Notes (Signed)
Date last pap: 04/13/2021. Last Depo-Provera: 05/29/2021. Side Effects if any: None. Serum HCG indicated? N/A. Depo-Provera 150 mg IM given by: Cristy Folks, CMA. Next appointment due Feb. 13 - Feb.27

## 2021-08-22 ENCOUNTER — Other Ambulatory Visit: Payer: Self-pay

## 2021-08-22 ENCOUNTER — Ambulatory Visit
Admission: RE | Admit: 2021-08-22 | Discharge: 2021-08-22 | Disposition: A | Discharge status: Home / Self Care | Payer: Medicare Other | Source: Ambulatory Visit | Attending: Obstetrics and Gynecology | Admitting: Obstetrics and Gynecology

## 2021-08-22 DIAGNOSIS — Z1231 Encounter for screening mammogram for malignant neoplasm of breast: Secondary | ICD-10-CM | POA: Insufficient documentation

## 2021-08-23 ENCOUNTER — Other Ambulatory Visit: Payer: Self-pay | Admitting: Obstetrics and Gynecology

## 2021-08-23 DIAGNOSIS — R928 Other abnormal and inconclusive findings on diagnostic imaging of breast: Secondary | ICD-10-CM

## 2021-08-23 DIAGNOSIS — N6489 Other specified disorders of breast: Secondary | ICD-10-CM

## 2021-08-24 ENCOUNTER — Other Ambulatory Visit: Payer: Self-pay | Admitting: Obstetrics and Gynecology

## 2021-08-24 DIAGNOSIS — R928 Other abnormal and inconclusive findings on diagnostic imaging of breast: Secondary | ICD-10-CM

## 2021-08-28 DIAGNOSIS — R569 Unspecified convulsions: Secondary | ICD-10-CM | POA: Diagnosis not present

## 2021-09-10 ENCOUNTER — Ambulatory Visit
Admission: RE | Admit: 2021-09-10 | Discharge: 2021-09-10 | Disposition: A | Discharge status: Home / Self Care | Payer: Medicare Other | Source: Ambulatory Visit | Attending: Obstetrics and Gynecology | Admitting: Obstetrics and Gynecology

## 2021-09-10 ENCOUNTER — Other Ambulatory Visit: Payer: Self-pay

## 2021-09-10 ENCOUNTER — Other Ambulatory Visit: Payer: Self-pay | Admitting: Obstetrics and Gynecology

## 2021-09-10 DIAGNOSIS — N6489 Other specified disorders of breast: Secondary | ICD-10-CM | POA: Diagnosis not present

## 2021-09-10 DIAGNOSIS — R928 Other abnormal and inconclusive findings on diagnostic imaging of breast: Secondary | ICD-10-CM | POA: Diagnosis not present

## 2021-09-10 DIAGNOSIS — R922 Inconclusive mammogram: Secondary | ICD-10-CM | POA: Diagnosis not present

## 2021-11-09 ENCOUNTER — Ambulatory Visit (INDEPENDENT_AMBULATORY_CARE_PROVIDER_SITE_OTHER): Payer: Medicare HMO | Admitting: Obstetrics and Gynecology

## 2021-11-09 ENCOUNTER — Other Ambulatory Visit: Payer: Self-pay

## 2021-11-09 DIAGNOSIS — Z3042 Encounter for surveillance of injectable contraceptive: Secondary | ICD-10-CM | POA: Diagnosis not present

## 2021-11-09 NOTE — Progress Notes (Signed)
Date last pap: 04/13/2021. Last Depo-Provera: 05/29/2021. Side Effects if any: None. Serum HCG indicated? N/A. Depo-Provera 150 mg IM given by: Cristy Folks, CMA. Next appointment due: May 5 - May 19.   Patient wanted to ask you is it ok for her to take Calcium supplements and it not react to her seizure medication.

## 2021-11-09 NOTE — Patient Instructions (Signed)

## 2021-12-13 DIAGNOSIS — H52223 Regular astigmatism, bilateral: Secondary | ICD-10-CM | POA: Diagnosis not present

## 2022-01-09 DIAGNOSIS — L821 Other seborrheic keratosis: Secondary | ICD-10-CM | POA: Diagnosis not present

## 2022-01-09 DIAGNOSIS — D485 Neoplasm of uncertain behavior of skin: Secondary | ICD-10-CM | POA: Diagnosis not present

## 2022-01-09 DIAGNOSIS — D2262 Melanocytic nevi of left upper limb, including shoulder: Secondary | ICD-10-CM | POA: Diagnosis not present

## 2022-01-09 DIAGNOSIS — B351 Tinea unguium: Secondary | ICD-10-CM | POA: Diagnosis not present

## 2022-01-09 DIAGNOSIS — D2271 Melanocytic nevi of right lower limb, including hip: Secondary | ICD-10-CM | POA: Diagnosis not present

## 2022-01-09 DIAGNOSIS — D225 Melanocytic nevi of trunk: Secondary | ICD-10-CM | POA: Diagnosis not present

## 2022-01-09 DIAGNOSIS — D2272 Melanocytic nevi of left lower limb, including hip: Secondary | ICD-10-CM | POA: Diagnosis not present

## 2022-01-09 DIAGNOSIS — D2261 Melanocytic nevi of right upper limb, including shoulder: Secondary | ICD-10-CM | POA: Diagnosis not present

## 2022-01-31 NOTE — Progress Notes (Signed)
Date last pap: 04/13/2021. ?Last Depo-Provera: 11/09/2021 ?Side Effects if any: None. ?Serum HCG indicated? N/A. ?Depo-Provera 150 mg IM given by: Cristy Folks, CMA. ?Next appointment due: July 28 - August 11 ?

## 2022-02-01 ENCOUNTER — Encounter: Payer: Self-pay | Admitting: Obstetrics and Gynecology

## 2022-02-01 ENCOUNTER — Ambulatory Visit (INDEPENDENT_AMBULATORY_CARE_PROVIDER_SITE_OTHER): Payer: Medicare HMO | Admitting: Obstetrics and Gynecology

## 2022-02-01 DIAGNOSIS — Z3042 Encounter for surveillance of injectable contraceptive: Secondary | ICD-10-CM

## 2022-02-01 NOTE — Patient Instructions (Signed)
Medroxyprogesterone Injection (Contraception) ?What is this medication? ?MEDROXYPROGESTERONE (me DROX ee proe JES te rone) prevents ovulation and pregnancy. It belongs to a group of medications called contraceptives. This medication is a progestin hormone. ?This medicine may be used for other purposes; ask your health care provider or pharmacist if you have questions. ?COMMON BRAND NAME(S): Depo-Provera, Depo-subQ Provera 104 ?What should I tell my care team before I take this medication? ?They need to know if you have any of these conditions: ?Asthma ?Blood clots ?Breast cancer or family history of breast cancer ?Depression ?Diabetes ?Eating disorder (anorexia nervosa) ?Heart attack ?High blood pressure ?HIV infection or AIDS ?If you often drink alcohol ?Kidney disease ?Liver disease ?Migraine headaches ?Osteoporosis, weak bones ?Seizures ?Stroke ?Tobacco smoker ?Vaginal bleeding ?An unusual or allergic reaction to medroxyprogesterone, other hormones, medications, foods, dyes, or preservatives ?Pregnant or trying to get pregnant ?Breast-feeding ?How should I use this medication? ?Depo-Provera CI contraceptive injection is given into a muscle. Depo-subQ Provera 104 injection is given under the skin. It is given in a hospital or clinic setting. The injection is usually given during the first 5 days after the start of a menstrual period or 6 weeks after delivery of a baby. ?A patient package insert for the product will be given with each prescription and refill. Be sure to read this information carefully each time. The sheet may change often. ?Talk to your care team about the use of this medication in children. Special care may be needed. These injections have been used in female children who have started having menstrual periods. ?Overdosage: If you think you have taken too much of this medicine contact a poison control center or emergency room at once. ?NOTE: This medicine is only for you. Do not share this medicine  with others. ?What if I miss a dose? ?Keep appointments for follow-up doses. You must get an injection once every 3 months. It is important not to miss your dose. Call your care team if you are unable to keep an appointment. ?What may interact with this medication? ?Antibiotics or medications for infections, especially rifampin and griseofulvin ?Antivirals for HIV or hepatitis ?Aprepitant ?Armodafinil ?Bexarotene ?Bosentan ?Medications for seizures like carbamazepine, felbamate, oxcarbazepine, phenytoin, phenobarbital, primidone, topiramate ?Mitotane ?Modafinil ?St. John's wort ?This list may not describe all possible interactions. Give your health care provider a list of all the medicines, herbs, non-prescription drugs, or dietary supplements you use. Also tell them if you smoke, drink alcohol, or use illegal drugs. Some items may interact with your medicine. ?What should I watch for while using this medication? ?This medication does not protect you against HIV infection (AIDS) or other sexually transmitted diseases. ?Use of this product may cause you to lose calcium from your bones. Loss of calcium may cause weak bones (osteoporosis). Only use this product for more than 2 years if other forms of birth control are not right for you. The longer you use this product for birth control the more likely you will be at risk for weak bones. Ask your care team how you can keep strong bones. ?You may have a change in bleeding pattern or irregular periods. Many females stop having periods while taking this medication. ?If you have received your injections on time, your chance of being pregnant is very low. If you think you may be pregnant, see your care team as soon as possible. ?Tell your care team if you want to get pregnant within the next year. The effect of this medication may last a   long time after you get your last injection. ?What side effects may I notice from receiving this medication? ?Side effects that you should  report to your care team as soon as possible: ?Allergic reactions--skin rash, itching, hives, swelling of the face, lips, tongue, or throat ?Blood clot--pain, swelling, or warmth in the leg, shortness of breath, chest pain ?Gallbladder problems--severe stomach pain, nausea, vomiting, fever ?Increase in blood pressure ?Liver injury--right upper belly pain, loss of appetite, nausea, light-colored stool, dark yellow or brown urine, yellowing skin or eyes, unusual weakness or fatigue ?New or worsening migraines or headaches ?Seizures ?Stroke--sudden numbness or weakness of the face, arm, or leg, trouble speaking, confusion, trouble walking, loss of balance or coordination, dizziness, severe headache, change in vision ?Unusual vaginal discharge, itching, or odor ?Worsening mood, feelings of depression ?Side effects that usually do not require medical attention (report to your care team if they continue or are bothersome): ?Breast pain or tenderness ?Dark patches of the skin on the face or other sun-exposed areas ?Irregular menstrual cycles or spotting ?Nausea ?Weight gain ?This list may not describe all possible side effects. Call your doctor for medical advice about side effects. You may report side effects to FDA at 1-800-FDA-1088. ?Where should I keep my medication? ?This injection is only given by a care team. It will not be stored at home. ?NOTE: This sheet is a summary. It may not cover all possible information. If you have questions about this medicine, talk to your doctor, pharmacist, or health care provider. ?? 2023 Elsevier/Gold Standard (2020-11-12 00:00:00) ? ?

## 2022-02-11 NOTE — Progress Notes (Unsigned)
     I,Sha'taria Tyson,acting as a Education administrator for [employer], [name].,have documented all relevant documentation on the behalf of Wendy Kirschner, [name],as directed by  Wendy Kirschner, [name] while in the presence of Wendy Kirschner, [name].  Established patient visit   Patient: Wendy Green   DOB: 1976-07-08   46 y.o. Female  MRN: [id number] Visit Date: 02/12/2022  Today's healthcare provider: [name], [name]   No chief complaint on file.  Subjective    HPI  Patient is being seen today to have summer camp paper work completed.  Medications: Outpatient Medications Prior to Visit  Medication Sig   acetaminophen (TYLENOL) 500 MG tablet Take 500 mg by mouth every 6 (six) hours as needed.   acetaZOLAMIDE (DIAMOX) 250 MG tablet TAKE 1 TABLET BY MOUTH 3 TIMES A DAY   fluconazole (DIFLUCAN) 200 MG tablet Take 200 mg by mouth once a week.   fluticasone (FLONASE) 50 MCG/ACT nasal spray Place into both nostrils daily.   folic acid (FOLVITE) 1 MG tablet TAKE 1 TABLET BY MOUTH ONCE DAILY   KRILL OIL PO Take 1 capsule by mouth 2 (two) times daily.    medroxyPROGESTERone (DEPO-PROVERA) 150 MG/ML injection Inject 1 mL (150 mg total) into the muscle every 3 (three) months.   MULTIPLE VITAMIN PO 1 tablet daily.   OXcarbazepine (TRILEPTAL PO) Take by mouth. Twice a day   primidone (MYSOLINE) 250 MG tablet Take by mouth.   simvastatin (ZOCOR) 20 MG tablet TAKE 1 TABLET BY MOUTH AT BEDTIME. PLEASE SCHEDULE AN OFFICE VISIT BEFORE ANYMORE REFILLS.   Wheat Dextrin (BENEFIBER PO) Take by mouth. As needed   Facility-Administered Medications Prior to Visit  Medication Dose Route Frequency Provider   medroxyPROGESTERone (DEPO-PROVERA) injection 150 mg  150 mg Intramuscular Q90 days Rubie Maid, MD    Review of Systems  {Labs  Heme  Chem  Endocrine  Serology  Results Review (optional):23779}   Objective    There were no vitals taken for this visit. {Show previous vital signs  (optional):23777}  Physical Exam  ***  No results found for any visits on 02/12/22.  Assessment & Plan     ***  No follow-ups on file.      {provider attestation***:1}   Wendy Kirschner, [name]  [facility] [phone] (phone) [phone] (fax)  [place]

## 2022-02-12 ENCOUNTER — Ambulatory Visit (INDEPENDENT_AMBULATORY_CARE_PROVIDER_SITE_OTHER): Payer: Medicare HMO | Admitting: Physician Assistant

## 2022-02-12 ENCOUNTER — Encounter: Payer: Self-pay | Admitting: Physician Assistant

## 2022-02-12 VITALS — BP 128/79 | HR 82 | Ht 64.0 in | Wt 169.3 lb

## 2022-02-12 DIAGNOSIS — Z0289 Encounter for other administrative examinations: Secondary | ICD-10-CM

## 2022-02-12 DIAGNOSIS — L209 Atopic dermatitis, unspecified: Secondary | ICD-10-CM | POA: Diagnosis not present

## 2022-02-12 MED ORDER — TRIAMCINOLONE ACETONIDE 0.1 % EX CREA: 1.0000 "application " | TOPICAL_CREAM | Freq: Two times a day (BID) | CUTANEOUS | 0 refills | Status: DC

## 2022-04-08 ENCOUNTER — Telehealth: Payer: Self-pay

## 2022-04-08 ENCOUNTER — Other Ambulatory Visit: Payer: Self-pay | Admitting: Physician Assistant

## 2022-04-08 DIAGNOSIS — R21 Rash and other nonspecific skin eruption: Secondary | ICD-10-CM

## 2022-04-08 DIAGNOSIS — L209 Atopic dermatitis, unspecified: Secondary | ICD-10-CM

## 2022-04-08 MED ORDER — KETOCONAZOLE 2 % EX CREA: 1.0000 | TOPICAL_CREAM | Freq: Every day | CUTANEOUS | 0 refills | Status: DC

## 2022-04-08 MED ORDER — TRIAMCINOLONE ACETONIDE 0.1 % EX CREA: 1.0000 | TOPICAL_CREAM | Freq: Two times a day (BID) | CUTANEOUS | 1 refills | Status: DC

## 2022-04-08 NOTE — Telephone Encounter (Signed)
Copied from [place]. Topic: Appointment Scheduling - Scheduling Inquiry for Clinic >> Apr 08, 2022  8:55 AM Everette C wrote: Reason for CRM: The patient's mother has called to share that the patient is still continuing to have a visible mark on their upper left thigh   The patient's mother has called to share that the dark mark the patient was previously seen for has now returned   The patient was previously prescribed a cream that was effective in helping to lighten/fade the dark mark but the medication is running low   The patient's mother would like to know if the patient's PCP would like to see the patient again prior to scheduling an additional appointment   Please contact further when possible

## 2022-04-08 NOTE — Telephone Encounter (Signed)
Also sent in ketoconazole topically since steroid is only minimally affective. Discussed w/ sha'taria pt and family made aware

## 2022-04-12 ENCOUNTER — Ambulatory Visit: Payer: Medicare HMO

## 2022-04-15 ENCOUNTER — Ambulatory Visit (INDEPENDENT_AMBULATORY_CARE_PROVIDER_SITE_OTHER): Payer: Medicare HMO | Admitting: Obstetrics and Gynecology

## 2022-04-15 VITALS — BP 119/82 | HR 89 | Ht 64.0 in | Wt 172.3 lb

## 2022-04-15 DIAGNOSIS — Z3042 Encounter for surveillance of injectable contraceptive: Secondary | ICD-10-CM | POA: Diagnosis not present

## 2022-04-15 NOTE — Progress Notes (Signed)
Date last pap: 04/13/21. Last Depo-Provera: 02/01/2022. Side Effects if any: N/a. Serum HCG indicated? N/a. Depo-Provera 150 mg IM given by: Levert Feinstein CMA. Next appointment due Oct-9 Oct-23.

## 2022-04-24 ENCOUNTER — Encounter: Payer: Self-pay | Admitting: Obstetrics and Gynecology

## 2022-04-26 ENCOUNTER — Other Ambulatory Visit: Payer: Self-pay | Admitting: Obstetrics and Gynecology

## 2022-04-29 NOTE — Telephone Encounter (Signed)
Patient needs annual exam scheduled (recent one was cancelled). She does have her next injection already scheduled for 10/19.

## 2022-05-10 ENCOUNTER — Encounter: Payer: Self-pay | Admitting: Obstetrics and Gynecology

## 2022-05-10 ENCOUNTER — Telehealth: Payer: Self-pay

## 2022-05-10 NOTE — Telephone Encounter (Signed)
Copied from [place] [id number]. Topic: General - Other >> May 10, 2022 11:30 AM Cyndi Bender wrote: Reason for CRM: Pt mother reports that pt has an appt with Dr. Marcelline Mates on 07/01/22 for depo shot but they would prefer to have this done with [name]. Pt mother would like a call back to advise. Cb# [phone]

## 2022-05-15 ENCOUNTER — Encounter: Payer: Medicare HMO | Admitting: Obstetrics and Gynecology

## 2022-07-01 ENCOUNTER — Ambulatory Visit: Payer: Medicare HMO

## 2022-07-13 ENCOUNTER — Other Ambulatory Visit: Payer: Self-pay | Admitting: Physician Assistant

## 2022-07-13 DIAGNOSIS — E782 Mixed hyperlipidemia: Secondary | ICD-10-CM

## 2022-07-15 ENCOUNTER — Other Ambulatory Visit: Payer: Self-pay

## 2022-07-15 ENCOUNTER — Encounter: Payer: Self-pay | Admitting: Physician Assistant

## 2022-07-15 ENCOUNTER — Ambulatory Visit (INDEPENDENT_AMBULATORY_CARE_PROVIDER_SITE_OTHER): Payer: Medicare HMO | Admitting: Physician Assistant

## 2022-07-15 ENCOUNTER — Telehealth: Payer: Self-pay

## 2022-07-15 VITALS — BP 106/75 | HR 80 | Ht 64.0 in | Wt 170.5 lb

## 2022-07-15 DIAGNOSIS — Z0000 Encounter for general adult medical examination without abnormal findings: Secondary | ICD-10-CM | POA: Diagnosis not present

## 2022-07-15 DIAGNOSIS — Z1211 Encounter for screening for malignant neoplasm of colon: Secondary | ICD-10-CM

## 2022-07-15 DIAGNOSIS — G40309 Generalized idiopathic epilepsy and epileptic syndromes, not intractable, without status epilepticus: Secondary | ICD-10-CM

## 2022-07-15 DIAGNOSIS — Z1231 Encounter for screening mammogram for malignant neoplasm of breast: Secondary | ICD-10-CM | POA: Diagnosis not present

## 2022-07-15 DIAGNOSIS — E782 Mixed hyperlipidemia: Secondary | ICD-10-CM | POA: Diagnosis not present

## 2022-07-15 DIAGNOSIS — Z23 Encounter for immunization: Secondary | ICD-10-CM | POA: Diagnosis not present

## 2022-07-15 MED ORDER — NA SULFATE-K SULFATE-MG SULF 17.5-3.13-1.6 GM/177ML PO SOLN: 1.0000 | Freq: Once | ORAL | 0 refills | Status: AC

## 2022-07-15 NOTE — Telephone Encounter (Signed)
Gastroenterology Pre-Procedure Review  Request Date: 08/06/22 Requesting Physician: Dr. Vicente Males  PATIENT REVIEW QUESTIONS: The patient responded to the following health history questions as indicated:    1. Are you having any GI issues? no 2. Do you have a personal history of Polyps? no 3. Do you have a family history of Colon Cancer or Polyps? no 4. Diabetes Mellitus? no 5. Joint replacements in the past 12 months?no 6. Major health problems in the past 3 months?no 7. Any artificial heart valves, MVP, or defibrillator?no    MEDICATIONS & ALLERGIES:    Patient reports the following regarding taking any anticoagulation/antiplatelet therapy:   Plavix, Coumadin, Eliquis, Xarelto, Lovenox, Pradaxa, Brilinta, or Effient? no Aspirin? no  Patient confirms/reports the following medications:  Current Outpatient Medications  Medication Sig Dispense Refill   acetaminophen (TYLENOL) 500 MG tablet Take 500 mg by mouth every 6 (six) hours as needed.     acetaZOLAMIDE (DIAMOX) 250 MG tablet TAKE 1 TABLET BY MOUTH 3 TIMES A DAY 90 tablet 3   fluticasone (FLONASE) 50 MCG/ACT nasal spray Place into both nostrils daily.     folic acid (FOLVITE) 1 MG tablet TAKE 1 TABLET BY MOUTH ONCE DAILY     ketoconazole (NIZORAL) 2 % cream Apply 1 Application topically daily. 15 g 0   KRILL OIL PO Take 1 capsule by mouth 2 (two) times daily.      medroxyPROGESTERone (DEPO-PROVERA) 150 MG/ML injection INJECT 1 ML (150 MG TOTAL) INTO THE MUSCLE EVERY 3 (THREE) MONTHS (Patient not taking: Reported on 07/15/2022) 1 mL 3   MULTIPLE VITAMIN PO 1 tablet daily.     OXcarbazepine (TRILEPTAL PO) Take by mouth. Twice a day     primidone (MYSOLINE) 250 MG tablet Take by mouth. Three times daily     simvastatin (ZOCOR) 20 MG tablet TAKE 1 TABLET BY MOUTH AT BEDTIME. PLEASE SCHEDULE AN OFFICE VISIT BEFORE ANYMORE REFILLS. 90 tablet 3   triamcinolone cream (KENALOG) 0.1 % Apply 1 Application topically 2 (two) times daily. 45 g 1    Wheat Dextrin (BENEFIBER PO) Take by mouth. As needed     Current Facility-Administered Medications  Medication Dose Route Frequency Provider Last Rate Last Admin   medroxyPROGESTERone (DEPO-PROVERA) injection 150 mg  150 mg Intramuscular Q90 days Rubie Maid, MD   150 mg at 04/15/22 1012    Patient confirms/reports the following allergies:  Allergies  Allergen Reactions   Aspirin     No orders of the defined types were placed in this encounter.   AUTHORIZATION INFORMATION Primary Insurance: 1D#: Group #:  Secondary Insurance: 1D#: Group #:  SCHEDULE INFORMATION: Date: 08/06/22 Time: Location: [facility]

## 2022-07-15 NOTE — Progress Notes (Signed)
I,Wendy Green,acting as a Education administrator for [employer], [name].,have documented all relevant documentation on the behalf of [name], [name],as directed by  [name], [name] while in the presence of [name], [name].   Annual Wellness Visit     Patient: Wendy Green, Female    DOB: March 29, 1976, 46 y.o.   MRN: [id number] Visit Date: 07/15/2022  Today's Provider: [name], [name]   Cc. awv  Subjective    Wendy Green is a 46 y.o. female who presents today for her Annual Wellness Visit. She reports consuming a general diet. The patient does not participate in regular exercise at present. She generally feels well. She reports sleeping well. She does not have additional problems to discuss today.   Medications: Outpatient Medications Prior to Visit  Medication Sig   acetaminophen (TYLENOL) 500 MG tablet Take 500 mg by mouth every 6 (six) hours as needed.   acetaZOLAMIDE (DIAMOX) 250 MG tablet TAKE 1 TABLET BY MOUTH 3 TIMES A DAY   fluticasone (FLONASE) 50 MCG/ACT nasal spray Place into both nostrils daily.   folic acid (FOLVITE) 1 MG tablet TAKE 1 TABLET BY MOUTH ONCE DAILY   ketoconazole (NIZORAL) 2 % cream Apply 1 Application topically daily.   KRILL OIL PO Take 1 capsule by mouth 2 (two) times daily.    MULTIPLE VITAMIN PO 1 tablet daily.   OXcarbazepine (TRILEPTAL PO) Take by mouth. Twice a day   primidone (MYSOLINE) 250 MG tablet Take by mouth. Three times daily   simvastatin (ZOCOR) 20 MG tablet TAKE 1 TABLET BY MOUTH AT BEDTIME. PLEASE SCHEDULE AN OFFICE VISIT BEFORE ANYMORE REFILLS.   triamcinolone cream (KENALOG) 0.1 % Apply 1 Application topically 2 (two) times daily.   Wheat Dextrin (BENEFIBER PO) Take by mouth. As needed   medroxyPROGESTERone (DEPO-PROVERA) 150 MG/ML injection INJECT 1 ML (150 MG TOTAL) INTO THE MUSCLE EVERY 3 (THREE) MONTHS (Patient not taking: Reported on 07/15/2022)   [DISCONTINUED] fluconazole (DIFLUCAN) 200 MG tablet Take  200 mg by mouth once a week. (Patient not taking: Reported on 02/12/2022)   Facility-Administered Medications Prior to Visit  Medication Dose Route Frequency Provider   medroxyPROGESTERone (DEPO-PROVERA) injection 150 mg  150 mg Intramuscular Q90 days Rubie Maid, MD    Allergies  Allergen Reactions   Aspirin     Patient Care Team: [name], [name] as PCP - General (Physician Assistant) Rubie Maid, MD as Referring Physician (Obstetrics and Gynecology) Anabel Bene, MD as Referring Physician (Neurology) Pa, [facility] as Consulting Physician (Dermatology)  Review of Systems  Constitutional:  Negative for fatigue and fever.  Respiratory:  Negative for cough and shortness of breath.   Cardiovascular:  Negative for chest pain and leg swelling.  Gastrointestinal:  Negative for abdominal pain.  Neurological:  Negative for dizziness and headaches.        Objective    Blood pressure 106/75, pulse 80, height '5\' 4"'$  (1.626 m), weight 170 lb 8 oz (77.3 kg), SpO2 100 %.    Physical Exam Constitutional:      General: She is awake.     Appearance: She is well-developed. She is not ill-appearing.  HENT:     Head: Normocephalic.     Right Ear: Tympanic membrane normal.     Left Ear: Tympanic membrane normal.     Nose: Nose normal. No congestion or rhinorrhea.     Mouth/Throat:     Pharynx: No oropharyngeal exudate or posterior oropharyngeal erythema.  Eyes:  Conjunctiva/sclera: Conjunctivae normal.     Pupils: Pupils are equal, round, and reactive to light.  Neck:     Thyroid: No thyroid mass or thyromegaly.  Cardiovascular:     Rate and Rhythm: Normal rate and regular rhythm.     Heart sounds: Normal heart sounds.  Pulmonary:     Effort: Pulmonary effort is normal.     Breath sounds: Normal breath sounds.  Musculoskeletal:     Right lower leg: No swelling. No edema.     Left lower leg: No swelling. No edema.  Lymphadenopathy:     Cervical: No  cervical adenopathy.  Skin:    General: Skin is warm.  Neurological:     Mental Status: She is alert and oriented to person, place, and time.  Psychiatric:        Attention and Perception: Attention normal.        Mood and Affect: Mood normal.        Speech: Speech normal.        Behavior: Behavior normal. Behavior is cooperative.    Most recent functional status assessment:    07/15/2022   10:39 AM  In your present state of health, do you have any difficulty performing the following activities:  Hearing? 0  Vision? 1  Difficulty concentrating or making decisions? 0  Walking or climbing stairs? 0  Dressing or bathing? 0  Doing errands, shopping? 1   Most recent fall risk assessment:    07/15/2022   10:39 AM  Fall Risk   Falls in the past year? 0  Number falls in past yr: 0  Injury with Fall? 0  Risk for fall due to : No Fall Risks  Follow up Falls evaluation completed    Most recent depression screenings:    07/15/2022   10:39 AM 02/12/2022   11:08 AM  PHQ 2/9 Scores  PHQ - 2 Score 0 0  PHQ- 9 Score 0 0   Most recent cognitive screening:     No data to display         Most recent Audit-C alcohol use screening    07/15/2022   10:39 AM  Alcohol Use Disorder Test (AUDIT)  1. How often do you have a drink containing alcohol? 0  2. How many drinks containing alcohol do you have on a typical day when you are drinking? 0  3. How often do you have six or more drinks on one occasion? 0  AUDIT-C Score 0   A score of 3 or more in women, and 4 or more in men indicates increased risk for alcohol abuse, EXCEPT if all of the points are from question 1   No results found for any visits on 07/15/22.  Assessment & Plan     Annual wellness visit done today including the all of the following: Reviewed patient's Family Medical History Reviewed and updated list of patient's medical providers Assessment of cognitive impairment was done Assessed patient's functional  ability Established a written schedule for health screening [place] Completed and Reviewed  Exercise Activities and Dietary recommendations --balanced diet high in fiber and protein, low in sugars, carbs, fats. --physical activity/exercise 30 minutes 3-5 times a week     Immunization History  Administered Date(s) Administered   Influenza,inj,Quad PF,6+ Mos 06/06/2016, 06/17/2017, 07/29/2018, 06/22/2019, 05/15/2020, 07/13/2021, 07/15/2022   PFIZER(Purple Top)SARS-COV-2 Vaccination 12/21/2019, 01/11/2020, 07/21/2020   Tdap 07/22/2011    Health Maintenance  Topic Date Due   COVID-19 Vaccine (4 -  Pfizer series) 09/15/2020   TETANUS/TDAP  07/21/2021   COLONOSCOPY (Pts 45-77yr Insurance coverage will need to be confirmed)  07/23/2022 (Originally 05/18/2021)   MAMMOGRAM  08/22/2022   PAP SMEAR-Modifier  04/13/2026   INFLUENZA VACCINE  Completed   HIV Screening  Completed   HPV VACCINES  Aged Out     Discussed health benefits of physical activity, and encouraged her to engage in regular exercise appropriate for her age and condition.    Problem List Items Addressed This Visit       Nervous and Auditory   Generalized idiopathic epilepsy, not intractable, without status epilepticus ([place]    Will continue monitoring med levels F/b neurology      Relevant Orders   Phenobarbital level   10-Hydroxycarbazepine     Other   Hyperlipidemia, unspecified    Managed with simvastatin 20 mg Will repeat lipids/cmp for stability The 10-year ASCVD risk score (Arnett DK, et al., 2019) is: 0.6%       Relevant Orders   Comprehensive Metabolic Panel (CMET)   Lipid Profile   Other Visit Diagnoses     Encounter for annual wellness exam in Medicare patient    -  Primary   Relevant Orders   CBC w/Diff/Platelet   TSH   HgB A1c   Colon cancer screening       Relevant Orders   Ambulatory referral to Gastroenterology   Breast cancer screening by mammogram        Relevant Orders   MM 3D SCREEN BREAST BILATERAL   Need for immunization against influenza       Relevant Orders   Flu Vaccine QUAD 679moM (Fluarix, Fluzone & Alfiuria Quad PF) (Completed)      Pt is recommended to get tetanus vaccine at the pharmacy   Return in about 1 year (around 07/16/2023) for CPE, AVW.   I, LiMikey KirschnerPA-C have reviewed all documentation for this visit. The documentation on  07/15/2022  for the exam, diagnosis, procedures, and orders are all accurate and complete.  LiMikey KirschnerPA-C [facility] [id number]: [phone]: [phone]

## 2022-07-15 NOTE — Assessment & Plan Note (Signed)
Will continue monitoring med levels F/b neurology

## 2022-07-15 NOTE — Assessment & Plan Note (Signed)
Managed with simvastatin 20 mg Will repeat lipids/cmp for stability The 10-year ASCVD risk score (Arnett DK, et al., 2019) is: 0.6%

## 2022-07-16 LAB — COMPREHENSIVE METABOLIC PANEL
ALT: 31 IU/L (ref 0–32)
AST: 18 IU/L (ref 0–40)
Albumin/Globulin Ratio: 1.7 (ref 1.2–2.2)
Albumin: 4.4 g/dL (ref 3.9–4.9)
Alkaline Phosphatase: 91 IU/L (ref 44–121)
BUN/Creatinine Ratio: 17 (ref 9–23)
BUN: 10 mg/dL (ref 6–24)
Bilirubin Total: <0.2 mg/dL (ref 0.0–1.2)
CO2: 17 mmol/L — ABNORMAL LOW (ref 20–29)
Calcium: 9.4 mg/dL (ref 8.7–10.2)
Chloride: 105 mmol/L (ref 96–106)
Creatinine, Ser: 0.59 mg/dL (ref 0.57–1.00)
Globulin, Total: 2.6 g/dL (ref 1.5–4.5)
Glucose: 86 mg/dL (ref 70–99)
Potassium: 3.8 mmol/L (ref 3.5–5.2)
Sodium: 141 mmol/L (ref 134–144)
Total Protein: 7 g/dL (ref 6.0–8.5)
eGFR: 112 mL/min/{1.73_m2} (ref >59)

## 2022-07-16 LAB — CBC WITH DIFFERENTIAL/PLATELET
Basophils Absolute: 0 10*3/uL (ref 0.0–0.2)
Basos: 1 %
EOS (ABSOLUTE): 0.2 10*3/uL (ref 0.0–0.4)
Eos: 3 %
Hematocrit: 42.9 % (ref 34.0–46.6)
Hemoglobin: 14.4 g/dL (ref 11.1–15.9)
Immature Grans (Abs): 0 10*3/uL (ref 0.0–0.1)
Immature Granulocytes: 0 %
Lymphocytes Absolute: 1.8 10*3/uL (ref 0.7–3.1)
Lymphs: 25 %
MCH: 32.6 pg (ref 26.6–33.0)
MCHC: 33.6 g/dL (ref 31.5–35.7)
MCV: 97 fL (ref 79–97)
Monocytes Absolute: 0.6 10*3/uL (ref 0.1–0.9)
Monocytes: 9 %
Neutrophils Absolute: 4.4 10*3/uL (ref 1.4–7.0)
Neutrophils: 62 %
Platelets: 201 10*3/uL (ref 150–450)
RBC: 4.42 x10E6/uL (ref 3.77–5.28)
RDW: 12.6 % (ref 11.7–15.4)
WBC: 7.1 10*3/uL (ref 3.4–10.8)

## 2022-07-16 LAB — LIPID PANEL
Chol/HDL Ratio: 3.5 ratio (ref 0.0–4.4)
Cholesterol, Total: 204 mg/dL — ABNORMAL HIGH (ref 100–199)
HDL: 58 mg/dL (ref >39)
LDL Chol Calc (NIH): 114 mg/dL — ABNORMAL HIGH (ref 0–99)
Triglycerides: 185 mg/dL — ABNORMAL HIGH (ref 0–149)
VLDL Cholesterol Cal: 32 mg/dL (ref 5–40)

## 2022-07-16 LAB — 10-HYDROXYCARBAZEPINE: Oxcarbazepine SerPl-Mcnc: 4 ug/mL — ABNORMAL LOW (ref 10–35)

## 2022-07-16 LAB — HEMOGLOBIN A1C
Est. average glucose Bld gHb Est-mCnc: 103 mg/dL
Hgb A1c MFr Bld: 5.2 % (ref 4.8–5.6)

## 2022-07-16 LAB — PHENOBARBITAL LEVEL: Phenobarbital, Serum: 25 ug/mL (ref 15–40)

## 2022-07-16 LAB — TSH: TSH: 2.81 u[IU]/mL (ref 0.450–4.500)

## 2022-07-23 ENCOUNTER — Telehealth: Payer: Self-pay

## 2022-07-23 NOTE — Telephone Encounter (Signed)
Copied from [place] [id number]. Topic: General - Inquiry >> Jul 22, 2022 12:03 PM Penni Bombard wrote: Pt 's mother called saying pt was not given her Depo shot when she came in the office on the 23rd.  We scheduled the appt but mom wants to know if the Depo can be called in to [facility] and brought to the office.  CB#  [phone]

## 2022-07-23 NOTE — Telephone Encounter (Signed)
Pts mother returning call  Advised her of note below, she verbalized understanding and requesting medication sent to  [facility]

## 2022-07-24 ENCOUNTER — Other Ambulatory Visit: Payer: Self-pay | Admitting: Physician Assistant

## 2022-07-24 ENCOUNTER — Ambulatory Visit (INDEPENDENT_AMBULATORY_CARE_PROVIDER_SITE_OTHER): Payer: Medicare HMO | Admitting: Physician Assistant

## 2022-07-24 DIAGNOSIS — Z3042 Encounter for surveillance of injectable contraceptive: Secondary | ICD-10-CM

## 2022-07-24 DIAGNOSIS — Z3009 Encounter for other general counseling and advice on contraception: Secondary | ICD-10-CM

## 2022-07-24 LAB — POCT URINE PREGNANCY: Preg Test, Ur: NEGATIVE

## 2022-07-24 MED ORDER — MEDROXYPROGESTERONE ACETATE 150 MG/ML IM SUSP: 150.0000 mg | INTRAMUSCULAR | 3 refills | Status: DC

## 2022-07-24 NOTE — Progress Notes (Unsigned)
      I,[name],acting as a Education administrator for [employer], [name].,have documented all relevant documentation on the behalf of [name], [name],as directed by  [name], [name] while in the presence of [name], [name].  Established patient visit   Patient: Wendy Green   DOB: 18-Jan-1976   46 y.o. Female  MRN: [id number] Visit Date: 07/24/2022  Today's healthcare provider: [name], [name]   No chief complaint on file.  Subjective    HPI   Patient last injection received on 04/15/22 and was due for next injection Oct 9 through Oct 23. Patient is 9 days outside of scheduled. Patient pregnancy test completed and was negative. Depo injection administered using 25G 1" needle into left gluteal area. Patient due for next injection Jan 17,2024- Jan 31,2024  Medications: Outpatient Medications Prior to Visit  Medication Sig   acetaminophen (TYLENOL) 500 MG tablet Take 500 mg by mouth every 6 (six) hours as needed.   acetaZOLAMIDE (DIAMOX) 250 MG tablet TAKE 1 TABLET BY MOUTH 3 TIMES A DAY   fluticasone (FLONASE) 50 MCG/ACT nasal spray Place into both nostrils daily.   folic acid (FOLVITE) 1 MG tablet TAKE 1 TABLET BY MOUTH ONCE DAILY   ketoconazole (NIZORAL) 2 % cream Apply 1 Application topically daily.   KRILL OIL PO Take 1 capsule by mouth 2 (two) times daily.    medroxyPROGESTERone (DEPO-PROVERA) 150 MG/ML injection Inject 1 mL (150 mg total) into the muscle every 3 (three) months. Bring into office   MULTIPLE VITAMIN PO 1 tablet daily.   OXcarbazepine (TRILEPTAL PO) Take by mouth. Twice a day   primidone (MYSOLINE) 250 MG tablet Take by mouth. Three times daily   simvastatin (ZOCOR) 20 MG tablet TAKE 1 TABLET BY MOUTH AT BEDTIME. PLEASE SCHEDULE AN OFFICE VISIT BEFORE ANYMORE REFILLS.   triamcinolone cream (KENALOG) 0.1 % Apply 1 Application topically 2 (two) times daily.   Wheat Dextrin (BENEFIBER PO) Take by mouth. As needed   No facility-administered  medications prior to visit.    Review of Systems  {Labs  Heme  Chem  Endocrine  Serology  Results Review (optional):23779}   Objective    There were no vitals taken for this visit. {Show previous vital signs (optional):23777}  Physical Exam  ***  No results found for any visits on 07/24/22.  Assessment & Plan     ***  No follow-ups on file.      {provider attestation***:1}   [name], [name]  [facility] [phone] (phone) [phone] (fax)  [place]

## 2022-08-06 ENCOUNTER — Ambulatory Visit
Admission: RE | Admit: 2022-08-06 | Discharge: 2022-08-06 | Disposition: A | Discharge status: Home / Self Care | Payer: Medicare HMO | Attending: Gastroenterology | Admitting: Gastroenterology

## 2022-08-06 ENCOUNTER — Encounter
Admission: RE | Disposition: A | Discharge status: Home / Self Care | Payer: Self-pay | Source: Home / Self Care | Attending: Gastroenterology

## 2022-08-06 ENCOUNTER — Encounter: Payer: Self-pay | Admitting: Gastroenterology

## 2022-08-06 ENCOUNTER — Ambulatory Visit: Payer: Medicare HMO | Admitting: Certified Registered Nurse Anesthetist

## 2022-08-06 DIAGNOSIS — G40909 Epilepsy, unspecified, not intractable, without status epilepticus: Secondary | ICD-10-CM | POA: Insufficient documentation

## 2022-08-06 DIAGNOSIS — F819 Developmental disorder of scholastic skills, unspecified: Secondary | ICD-10-CM | POA: Insufficient documentation

## 2022-08-06 DIAGNOSIS — D122 Benign neoplasm of ascending colon: Secondary | ICD-10-CM | POA: Insufficient documentation

## 2022-08-06 DIAGNOSIS — E7800 Pure hypercholesterolemia, unspecified: Secondary | ICD-10-CM | POA: Diagnosis not present

## 2022-08-06 DIAGNOSIS — R69 Illness, unspecified: Secondary | ICD-10-CM | POA: Diagnosis not present

## 2022-08-06 DIAGNOSIS — D126 Benign neoplasm of colon, unspecified: Secondary | ICD-10-CM | POA: Diagnosis not present

## 2022-08-06 DIAGNOSIS — Z1211 Encounter for screening for malignant neoplasm of colon: Secondary | ICD-10-CM | POA: Diagnosis not present

## 2022-08-06 DIAGNOSIS — L210 Seborrhea capitis: Secondary | ICD-10-CM | POA: Diagnosis not present

## 2022-08-06 DIAGNOSIS — K635 Polyp of colon: Secondary | ICD-10-CM | POA: Diagnosis not present

## 2022-08-06 HISTORY — PX: COLONOSCOPY WITH PROPOFOL: SHX5780

## 2022-08-06 SURGERY — COLONOSCOPY WITH PROPOFOL
Anesthesia: General

## 2022-08-06 MED ORDER — PHENYLEPHRINE HCL (PRESSORS) 10 MG/ML IV SOLN
INTRAVENOUS | Status: DC | PRN
  Administered 2022-08-06: 40 ug via INTRAVENOUS

## 2022-08-06 MED ORDER — SODIUM CHLORIDE 0.9 % IV SOLN: INTRAVENOUS | Status: DC

## 2022-08-06 MED ORDER — PHENYLEPHRINE 80 MCG/ML (10ML) SYRINGE FOR IV PUSH (FOR BLOOD PRESSURE SUPPORT)
PREFILLED_SYRINGE | INTRAVENOUS | Status: AC
  Filled 2022-08-06: qty 10

## 2022-08-06 MED ORDER — PROPOFOL 10 MG/ML IV BOLUS
INTRAVENOUS | Status: DC | PRN
  Administered 2022-08-06: 30 mg via INTRAVENOUS
  Administered 2022-08-06: 70 mg via INTRAVENOUS

## 2022-08-06 MED ORDER — LIDOCAINE HCL (PF) 1 % IJ SOLN
INTRAMUSCULAR | Status: AC
  Filled 2022-08-06: qty 2

## 2022-08-06 MED ORDER — MIDAZOLAM HCL 2 MG/2ML IJ SOLN
INTRAMUSCULAR | Status: AC
  Filled 2022-08-06: qty 2

## 2022-08-06 MED ORDER — PROPOFOL 500 MG/50ML IV EMUL
INTRAVENOUS | Status: DC | PRN
  Administered 2022-08-06: 100 ug/kg/min via INTRAVENOUS

## 2022-08-06 MED ORDER — MIDAZOLAM HCL 5 MG/5ML IJ SOLN
INTRAMUSCULAR | Status: DC | PRN
  Administered 2022-08-06: 2 mg via INTRAVENOUS

## 2022-08-06 NOTE — Transfer of Care (Signed)
Immediate Anesthesia Transfer of Care Note  Patient: Wendy Green  Procedure(s) Performed: COLONOSCOPY WITH PROPOFOL  Patient Location: PACU  Anesthesia Type:General  Level of Consciousness: drowsy  Airway & Oxygen Therapy: Patient Spontanous Breathing  Post-op Assessment: Report given to RN and Post -op Vital signs reviewed and stable  Post vital signs: Reviewed and stable  Last Vitals:  Vitals Value Taken Time  BP    Temp    Pulse    Resp    SpO2      Last Pain:  Vitals:   08/06/22 0809  TempSrc: Temporal  PainSc: 0-No pain         Complications: No notable events documented.

## 2022-08-06 NOTE — Anesthesia Preprocedure Evaluation (Signed)
Anesthesia Evaluation  Patient identified by MRN, date of birth, ID band Patient awake    Reviewed: Allergy & Precautions, NPO status , Patient's Chart, lab work & pertinent test results  History of Anesthesia Complications Negative for: history of anesthetic complications  Airway Mallampati: Unable to assess  TM Distance: >3 FB Neck ROM: Full    Dental no notable dental hx. (+) Teeth Intact   Pulmonary neg pulmonary ROS, neg sleep apnea, neg COPD, Patient abstained from smoking.Not current smoker   Pulmonary exam normal breath sounds clear to auscultation       Cardiovascular Exercise Tolerance: Good METS(-) hypertension(-) CAD and (-) Past MI negative cardio ROS (-) dysrhythmias  Rhythm:Regular Rate:Normal - Systolic murmurs    Neuro/Psych  Headaches, Seizures -, Well Controlled,  PSYCHIATRIC DISORDERS      Intellectual delay   GI/Hepatic ,neg GERD  ,,(+)     (-) substance abuse    Endo/Other  neg diabetes    Renal/GU negative Renal ROS     Musculoskeletal   Abdominal   Peds  Hematology   Anesthesia Other Findings Past Medical History: 02/24/2015: Abnormal weight gain 02/24/2015: Cerebral seizure ([place])     Comment:  dx at age 43.  02/24/2015: Changeable mood 05/16/2009: Epilepsy ([place]) 09/11/2015: Episodic mood disorder ([place]) No date: Family history of adverse reaction to anesthesia     Comment:  mother has nausea and vomiting. 09/11/2015: Headache, migraine 06/04/2016: Hematuria No date: High cholesterol No date: History of kidney stones 05/16/2009: HLD (hyperlipidemia) 02/24/2015: Intellectual disability No date: Seborrheic dermatitis of scalp 09/11/2015: Seizure ([place])  Reproductive/Obstetrics                             Anesthesia Physical Anesthesia Plan  ASA: 2  Anesthesia Plan: General   Post-op Pain Management: Minimal or no pain anticipated   Induction: Intravenous  PONV  Risk Score and Plan: 3 and Propofol infusion, TIVA, Ondansetron and Midazolam  Airway Management Planned: Nasal Cannula  Additional Equipment: None  Intra-op Plan:   Post-operative Plan:   Informed Consent: I have reviewed the patients History and Physical, chart, labs and discussed the procedure including the risks, benefits and alternatives for the proposed anesthesia with the patient or authorized representative who has indicated his/her understanding and acceptance.     Dental advisory given and Consent reviewed with POA  Plan Discussed with: CRNA and Surgeon  Anesthesia Plan Comments: (Discussed risks of anesthesia with mother at bedside, including possibility of difficulty with spontaneous ventilation under anesthesia necessitating airway intervention, PONV, and rare risks such as cardiac or respiratory or neurological events, and allergic reactions. Discussed the role of CRNA in patient's perioperative care. She understands.  Patient very nervous, barely able to let us get an IV. Would benefit from preop versed.)       Anesthesia Quick Evaluation

## 2022-08-06 NOTE — Anesthesia Procedure Notes (Signed)
Date/Time: 08/06/2022 9:14 AM  Performed by: Demetrius Charity, CRNAPre-anesthesia Checklist: Patient identified, Emergency Drugs available, Suction available, Patient being monitored and Timeout performed Patient Re-evaluated:Patient Re-evaluated prior to induction Oxygen Delivery Method: Nasal cannula Induction Type: IV induction Placement Confirmation: CO2 detector and positive ETCO2

## 2022-08-06 NOTE — H&P (Signed)
Jonathon Bellows, MD [address] [place], [place], [place], [address] [address], [place], [place], [place], [address] Phone: [phone]  Fax: [phone]  Primary Care Physician:  [name], [name]   Pre-Procedure History & Physical: HPI:  Wendy Green is a 46 y.o. female is here for an colonoscopy.   Past Medical History:  Diagnosis Date   Abnormal weight gain 02/24/2015   Cerebral seizure ([place]) 02/24/2015   dx at age 48.    Changeable mood 02/24/2015   Epilepsy ([place]) 05/16/2009   Episodic mood disorder ([place]) 09/11/2015   Family history of adverse reaction to anesthesia    mother has nausea and vomiting.   Headache, migraine 09/11/2015   Hematuria 06/04/2016   High cholesterol    History of kidney stones    HLD (hyperlipidemia) 05/16/2009   Intellectual disability 02/24/2015   Seborrheic dermatitis of scalp    Seizure ([place]) 09/11/2015    Past Surgical History:  Procedure Laterality Date   CYSTOSCOPY W/ RETROGRADES Bilateral 07/16/2016   Procedure: CYSTOSCOPY WITH RETROGRADE PYELOGRAM;  Surgeon: Hollice Espy, MD;  Location: [facility] ORS;  Service: Urology;  Laterality: Bilateral;   NO PAST SURGERIES      Prior to Admission medications   Medication Sig Start Date End Date Taking? Authorizing Provider  acetaZOLAMIDE (DIAMOX) 250 MG tablet TAKE 1 TABLET BY MOUTH 3 TIMES A DAY 09/07/15  Yes Chrismon, Vickki Muff, [name]  fluticasone (FLONASE) 50 MCG/ACT nasal spray Place into both nostrils daily.   Yes [provider]  folic acid (FOLVITE) 1 MG tablet TAKE 1 TABLET BY MOUTH ONCE DAILY 06/19/15  Yes [provider]  KRILL OIL PO Take 1 capsule by mouth 2 (two) times daily.    Yes [provider]  medroxyPROGESTERone (DEPO-PROVERA) 150 MG/ML injection Inject 1 mL (150 mg total) into the muscle every 3 (three) months. Bring into office 07/24/22  Yes Drubel, Ria Comment, [name]  OXcarbazepine (TRILEPTAL PO) Take by mouth. Twice a day   Yes [provider]  primidone (MYSOLINE) 250 MG tablet Take by mouth. Three times daily 04/23/21  Yes [provider]  simvastatin (ZOCOR) 20 MG tablet TAKE 1 TABLET BY MOUTH AT BEDTIME. PLEASE SCHEDULE AN OFFICE VISIT BEFORE ANYMORE REFILLS. 07/15/22  Yes [name], [name]  acetaminophen (TYLENOL) 500 MG tablet Take 500 mg by mouth every 6 (six) hours as needed.    [provider]  ketoconazole (NIZORAL) 2 % cream Apply 1 Application topically daily. 04/08/22   [name], [name]  MULTIPLE VITAMIN PO 1 tablet daily. 08/15/09   [provider]  triamcinolone cream (KENALOG) 0.1 % Apply 1 Application topically 2 (two) times daily. 04/08/22   Drubel, Ria Comment, [name]  Wheat Dextrin (BENEFIBER PO) Take by mouth. As needed    [provider]    Allergies as of 07/16/2022 - Review Complete 07/15/2022  Allergen Reaction Noted   Aspirin  02/24/2015    Family History  Problem Relation Age of Onset   Heart attack Father    Stroke Maternal Grandmother    Pneumonia Maternal Grandfather    Cancer Paternal Grandfather    Prostate cancer Neg Hx    Kidney cancer Neg Hx    Breast cancer Neg Hx    Ovarian cancer Neg Hx    Colon cancer Neg Hx    Diabetes Neg Hx     Social History   Socioeconomic History   Marital status: Single    Spouse name: Not  on file   Number of children: 0   Years of education: Not on file   Highest education level: 12th grade  Occupational History   Occupation: none  Tobacco Use   Smoking status: Never   Smokeless tobacco: Never  Vaping Use   Vaping Use: Never used  Substance and Sexual Activity   Alcohol use: No    Alcohol/week: 0.0 standard drinks of alcohol   Drug use: No   Sexual activity: Not Currently    Birth control/protection: None, Injection  Other Topics Concern   Not on file  Social History Narrative   Not on file   Social Determinants of Health   Financial Resource Strain: Low Risk  (03/20/2020)   Overall  Financial Resource Strain (CARDIA)    Difficulty of Paying Living Expenses: Not hard at all  Food Insecurity: No Food Insecurity (03/20/2020)   Hunger Vital Sign    Worried About Running Out of Food in the Last Year: Never true    Ran Out of Food in the Last Year: Never true  Transportation Needs: No Transportation Needs (03/20/2020)   PRAPARE - Hydrologist (Medical): No    Lack of Transportation (Non-Medical): No  Physical Activity: Inactive (03/20/2020)   Exercise Vital Sign    Days of Exercise per Week: 0 days    Minutes of Exercise per Session: 0 min  Stress: No Stress Concern Present (03/20/2020)   [place]    Feeling of Stress : Only a little  Social Connections: Socially Isolated (03/20/2020)   Social Connection and Isolation Panel [NHANES]    Frequency of Communication with Friends and Family: Once a week    Frequency of Social Gatherings with Friends and Family: More than three times a week    Attends Religious Services: Never    Marine scientist or Organizations: No    Attends Archivist Meetings: Never    Marital Status: Never married  Intimate Partner Violence: Not At Risk (03/20/2020)   Humiliation, Afraid, Rape, and Kick questionnaire    Fear of Current or Ex-Partner: No    Emotionally Abused: No    Physically Abused: No    Sexually Abused: No    Review of Systems: See HPI, otherwise negative ROS  Physical Exam: BP (!) 147/96   Pulse 95   Temp (!) 96.9 F (36.1 C) (Temporal)   Resp 16   Ht '5\' 4"'$  (1.626 m)   Wt 77.1 kg   SpO2 98%   BMI 29.18 kg/m  General:   Alert,  pleasant and cooperative in NAD Head:  Normocephalic and atraumatic. Neck:  Supple; no masses or thyromegaly. Lungs:  Clear throughout to auscultation, normal respiratory effort.    Heart:  +S1, +S2, Regular rate and rhythm, No edema. Abdomen:  Soft, nontender and nondistended. Normal  bowel sounds, without guarding, and without rebound.   Neurologic:  Alert and  oriented x4;  grossly normal neurologically.  Impression/Plan: Wendy Green is here for an colonoscopy to be performed for Screening colonoscopy average risk   Risks, benefits, limitations, and alternatives regarding  colonoscopy have been reviewed with the patient.  Questions have been answered.  All parties agreeable.   Jonathon Bellows, MD  08/06/2022, 8:55 AM

## 2022-08-06 NOTE — Anesthesia Postprocedure Evaluation (Signed)
Anesthesia Post Note  Patient: Wendy Green  Procedure(s) Performed: COLONOSCOPY WITH PROPOFOL  Patient location during evaluation: Endoscopy Anesthesia Type: General Level of consciousness: awake and alert Pain management: pain level controlled Vital Signs Assessment: post-procedure vital signs reviewed and stable Respiratory status: spontaneous breathing, nonlabored ventilation, respiratory function stable and patient connected to nasal cannula oxygen Cardiovascular status: blood pressure returned to baseline and stable Postop Assessment: no apparent nausea or vomiting Anesthetic complications: no   No notable events documented.   Last Vitals:  Vitals:   08/06/22 0947 08/06/22 0957  BP: 115/89 104/80  Pulse: 89 85  Resp: 15 17  Temp: (!) 35.6 C   SpO2: 100% 100%    Last Pain:  Vitals:   08/06/22 0957  TempSrc:   PainSc: 0-No pain                 Arita Miss

## 2022-08-06 NOTE — Op Note (Signed)
[facility] Gastroenterology Patient Name: Wendy Green Procedure Date: 08/06/2022 8:59 AM MRN: [id number] Account #: [id number] Date of Birth: 06-30-1976 Admit Type: Outpatient Age: 46 Room: [facility] ENDO ROOM 2 Gender: Female Note Status: Finalized Instrument Name: Jasper Riling [id number] Procedure:             Colonoscopy Indications:           Screening for colorectal malignant neoplasm Providers:             Jonathon Bellows MD, MD Referring MD:          Sharen Counter Medicines:             Monitored Anesthesia Care Complications:         No immediate complications. Procedure:             Pre-Anesthesia Assessment:                        - Prior to the procedure, a History and Physical was                         performed, and patient medications, allergies and                         sensitivities were reviewed. The patient's tolerance                         of previous anesthesia was reviewed.                        - The risks and benefits of the procedure and the                         sedation options and risks were discussed with the                         patient. All questions were answered and informed                         consent was obtained.                        - ASA Grade Assessment: II - A patient with mild                         systemic disease.                        After obtaining informed consent, the colonoscope was                         passed under direct vision. Throughout the procedure,                         the patient's blood pressure, pulse, and oxygen                         saturations were monitored continuously. The                         Colonoscope was introduced through the anus  and                         advanced to the the cecum, identified by the                         appendiceal orifice. The colonoscopy was performed                         with ease. The patient tolerated the procedure well.                          The quality of the bowel preparation was excellent.                         Anatomical landmarks were photographed. Findings:      The perianal and digital rectal examinations were normal.      Three sessile polyps were found in the ascending colon. The polyps were       4 to 5 mm in size. These polyps were removed with a cold snare.       Resection and retrieval were complete.      The exam was otherwise without abnormality on direct and retroflexion       views. Impression:            - Three 4 to 5 mm polyps in the ascending colon,                         removed with a cold snare. Resected and retrieved.                        - The examination was otherwise normal on direct and                         retroflexion views. Recommendation:        - Discharge patient to home (with escort).                        - Resume previous diet.                        - Continue present medications.                        - Await pathology results.                        - Repeat colonoscopy for surveillance based on                         pathology results. Procedure Code(s):     --- Professional ---                        [phone], Colonoscopy, flexible; with removal of                         tumor(s), polyp(s), or other lesion(s) by snare                         technique Diagnosis  Code(s):     --- Professional ---                        Z12.11, Encounter for screening for malignant neoplasm                         of colon                        D12.2, Benign neoplasm of ascending colon CPT copyright 2022 American Medical Association. All rights reserved. The codes documented in this report are preliminary and upon coder review may  be revised to meet current compliance requirements. Jonathon Bellows, MD Jonathon Bellows MD, MD 08/06/2022 9:43:01 AM This report has been signed electronically. Number of Addenda: 0 Note Initiated On: 08/06/2022 8:59 AM Scope Withdrawal Time: 0 hours 14  minutes 5 seconds  Total Procedure Duration: 0 hours 18 minutes 8 seconds  Estimated Blood Loss:  Estimated blood loss: none.      [facility]

## 2022-08-07 ENCOUNTER — Encounter: Payer: Self-pay | Admitting: Gastroenterology

## 2022-08-07 LAB — SURGICAL PATHOLOGY

## 2022-08-14 ENCOUNTER — Other Ambulatory Visit: Payer: Self-pay | Admitting: Physician Assistant

## 2022-08-14 DIAGNOSIS — L209 Atopic dermatitis, unspecified: Secondary | ICD-10-CM

## 2022-08-16 NOTE — Telephone Encounter (Signed)
Requested medication (s) are due for refill today:   Provider to review  Requested medication (s) are on the active medication list:   Yes  Future visit scheduled:   Yes   Last ordered: 04/08/2022 45 g, 1 refill  Non delegated refill   Requested Prescriptions  Pending Prescriptions Disp Refills   triamcinolone cream (KENALOG) 0.1 % [Pharmacy Med Name: TRIAMCINOLONE 0.1% CREAM] 90 g     Sig: APPLY TO [place]     Not Delegated - Dermatology:  Corticosteroids Failed - 08/14/2022  6:03 PM      Failed - This refill cannot be delegated      Passed - Valid encounter within last 12 months    Recent Outpatient Visits           6 months ago Physical exam for [place] Thedore Mins, [place], [name]   3 years ago Nonintractable generalized idiopathic epilepsy without status epilepticus [facility])   [place], [name]   3 years ago Acute otitis externa of left ear, unspecified type   [place], [place], [place]   4 years ago Annual physical exam   [employer], Vickki Muff, [name]   4 years ago Mixed hyperlipidemia   [employer], Vickki Muff, [place]

## 2022-08-27 ENCOUNTER — Ambulatory Visit
Admission: RE | Admit: 2022-08-27 | Discharge: 2022-08-27 | Disposition: A | Discharge status: Home / Self Care | Payer: Medicare HMO | Source: Ambulatory Visit | Attending: Physician Assistant | Admitting: Physician Assistant

## 2022-08-27 DIAGNOSIS — Z1231 Encounter for screening mammogram for malignant neoplasm of breast: Secondary | ICD-10-CM | POA: Diagnosis not present

## 2022-08-28 DIAGNOSIS — R569 Unspecified convulsions: Secondary | ICD-10-CM | POA: Diagnosis not present

## 2022-10-09 ENCOUNTER — Ambulatory Visit (INDEPENDENT_AMBULATORY_CARE_PROVIDER_SITE_OTHER): Payer: Medicare HMO | Admitting: Physician Assistant

## 2022-10-09 ENCOUNTER — Encounter: Payer: Self-pay | Admitting: Physician Assistant

## 2022-10-09 VITALS — BP 109/78 | HR 87 | Temp 97.6°F

## 2022-10-09 DIAGNOSIS — Z3042 Encounter for surveillance of injectable contraceptive: Secondary | ICD-10-CM | POA: Diagnosis not present

## 2022-10-09 MED ORDER — MEDROXYPROGESTERONE ACETATE 150 MG/ML IM SUSP
150.0000 mg | Freq: Once | INTRAMUSCULAR | Status: AC
  Administered 2022-10-09: 150 mg via INTRAMUSCULAR

## 2022-10-09 NOTE — Progress Notes (Signed)
Administered medroxyprogesterone 150 mg right ventrogluteal. Pt tolerated well.

## 2022-10-09 NOTE — Progress Notes (Signed)
      Established patient visit   Patient: Wendy Green   DOB: May 25, 1976   47 y.o. Female  MRN: [id number] Visit Date: 10/09/2022   Subjective    HPI  Patient last injection received on 07/24/22 and was due for next injection Jan 17  through Jan 31. Patient is on schedule. Depo injection administered using 25G 1" needle into left gluteal area on 07/24/22. Today she will receive her injection into right gluteal area. Patient due for next injection April 4,2024- April 18,2024   Medications: Outpatient Medications Prior to Visit  Medication Sig   acetaminophen (TYLENOL) 500 MG tablet Take 500 mg by mouth every 6 (six) hours as needed.   acetaZOLAMIDE (DIAMOX) 250 MG tablet TAKE 1 TABLET BY MOUTH 3 TIMES A DAY   fluticasone (FLONASE) 50 MCG/ACT nasal spray Place into both nostrils daily.   folic acid (FOLVITE) 1 MG tablet TAKE 1 TABLET BY MOUTH ONCE DAILY   ketoconazole (NIZORAL) 2 % cream Apply 1 Application topically daily.   KRILL OIL PO Take 1 capsule by mouth 2 (two) times daily.    medroxyPROGESTERone (DEPO-PROVERA) 150 MG/ML injection Inject 1 mL (150 mg total) into the muscle every 3 (three) months. Bring into office   MULTIPLE VITAMIN PO 1 tablet daily.   OXcarbazepine (TRILEPTAL PO) Take by mouth. Twice a day   primidone (MYSOLINE) 250 MG tablet Take by mouth. Three times daily   simvastatin (ZOCOR) 20 MG tablet TAKE 1 TABLET BY MOUTH AT BEDTIME. PLEASE SCHEDULE AN OFFICE VISIT BEFORE ANYMORE REFILLS.   triamcinolone cream (KENALOG) 0.1 % APPLY TO AFFECTED AREA TWICE A DAY   Wheat Dextrin (BENEFIBER PO) Take by mouth. As needed   No facility-administered medications prior to visit.

## 2023-01-08 ENCOUNTER — Ambulatory Visit (INDEPENDENT_AMBULATORY_CARE_PROVIDER_SITE_OTHER): Payer: Medicare HMO | Admitting: Physician Assistant

## 2023-01-08 DIAGNOSIS — Z3042 Encounter for surveillance of injectable contraceptive: Secondary | ICD-10-CM

## 2023-01-08 MED ORDER — MEDROXYPROGESTERONE ACETATE 150 MG/ML IM SUSY
150.0000 mg | PREFILLED_SYRINGE | Freq: Once | INTRAMUSCULAR | Status: AC
  Administered 2023-01-08: 150 mg via INTRAMUSCULAR

## 2023-01-08 MED ORDER — MEDROXYPROGESTERONE ACETATE 150 MG/ML IM SUSY: 150.0000 mg | PREFILLED_SYRINGE | Freq: Once | INTRAMUSCULAR | Status: DC

## 2023-01-08 NOTE — Progress Notes (Signed)
     I,Sha'taria Tyson,acting as a Neurosurgeon for [employer], [name].,have documented all relevant documentation on the behalf of Alfredia Ferguson, [name],as directed by  Alfredia Ferguson, [name] while in the presence of Alfredia Ferguson, [name].   Established patient visit   Patient: Wendy Green   DOB: 1976-08-30   47 y.o. Female  MRN: [id number] Visit Date: 01/08/2023  Today's healthcare provider: Alfredia Ferguson, [name]   Cc. Depo contraception  Subjective    HPI  Patient seen today to have depo injection administered. Patient supplied. Patient is due for next injection July 3 - April 09, 2023.  Medications: Outpatient Medications Prior to Visit  Medication Sig   acetaminophen (TYLENOL) 500 MG tablet Take 500 mg by mouth every 6 (six) hours as needed.   acetaZOLAMIDE (DIAMOX) 250 MG tablet TAKE 1 TABLET BY MOUTH 3 TIMES A DAY   fluticasone (FLONASE) 50 MCG/ACT nasal spray Place into both nostrils daily.   folic acid (FOLVITE) 1 MG tablet TAKE 1 TABLET BY MOUTH ONCE DAILY   ketoconazole (NIZORAL) 2 % cream Apply 1 Application topically daily.   KRILL OIL PO Take 1 capsule by mouth 2 (two) times daily.    medroxyPROGESTERone (DEPO-PROVERA) 150 MG/ML injection Inject 1 mL (150 mg total) into the muscle every 3 (three) months. Bring into office   MULTIPLE VITAMIN PO 1 tablet daily.   OXcarbazepine (TRILEPTAL PO) Take by mouth. Twice a day   primidone (MYSOLINE) 250 MG tablet Take by mouth. Three times daily   simvastatin (ZOCOR) 20 MG tablet TAKE 1 TABLET BY MOUTH AT BEDTIME. PLEASE SCHEDULE AN OFFICE VISIT BEFORE ANYMORE REFILLS.   triamcinolone cream (KENALOG) 0.1 % APPLY TO AFFECTED AREA TWICE A DAY   Wheat Dextrin (BENEFIBER PO) Take by mouth. As needed   No facility-administered medications prior to visit.   I, Alfredia Ferguson, [name] have reviewed all documentation for this visit. The documentation on  01/08/23  for the exam, diagnosis, procedures, and orders are all accurate and  complete.  Alfredia Ferguson, [name] [facility] [address] [place], [place], [address] Office: [phone] Fax: [phone]    [facility]

## 2023-01-09 DIAGNOSIS — H53001 Unspecified amblyopia, right eye: Secondary | ICD-10-CM | POA: Diagnosis not present

## 2023-01-09 DIAGNOSIS — H2513 Age-related nuclear cataract, bilateral: Secondary | ICD-10-CM | POA: Diagnosis not present

## 2023-01-15 ENCOUNTER — Encounter: Payer: Self-pay | Admitting: Physician Assistant

## 2023-01-15 ENCOUNTER — Ambulatory Visit (INDEPENDENT_AMBULATORY_CARE_PROVIDER_SITE_OTHER): Payer: Medicare HMO | Admitting: Physician Assistant

## 2023-01-15 VITALS — BP 115/78 | HR 88 | Wt 179.5 lb

## 2023-01-15 DIAGNOSIS — H6692 Otitis media, unspecified, left ear: Secondary | ICD-10-CM | POA: Diagnosis not present

## 2023-01-15 DIAGNOSIS — D2261 Melanocytic nevi of right upper limb, including shoulder: Secondary | ICD-10-CM | POA: Diagnosis not present

## 2023-01-15 DIAGNOSIS — L814 Other melanin hyperpigmentation: Secondary | ICD-10-CM | POA: Diagnosis not present

## 2023-01-15 DIAGNOSIS — D225 Melanocytic nevi of trunk: Secondary | ICD-10-CM | POA: Diagnosis not present

## 2023-01-15 DIAGNOSIS — D2272 Melanocytic nevi of left lower limb, including hip: Secondary | ICD-10-CM | POA: Diagnosis not present

## 2023-01-15 DIAGNOSIS — D2262 Melanocytic nevi of left upper limb, including shoulder: Secondary | ICD-10-CM | POA: Diagnosis not present

## 2023-01-15 DIAGNOSIS — L821 Other seborrheic keratosis: Secondary | ICD-10-CM | POA: Diagnosis not present

## 2023-01-15 DIAGNOSIS — D2271 Melanocytic nevi of right lower limb, including hip: Secondary | ICD-10-CM | POA: Diagnosis not present

## 2023-01-15 MED ORDER — AMOXICILLIN 875 MG PO TABS: 875.0000 mg | ORAL_TABLET | Freq: Two times a day (BID) | ORAL | 0 refills | Status: AC

## 2023-01-15 NOTE — Progress Notes (Signed)
I,Sha'taria Tyson,acting as a Neurosurgeon for [employer], [name].,have documented all relevant documentation on the behalf of Alfredia Ferguson, [name],as directed by  Alfredia Ferguson, [name] while in the presence of Alfredia Ferguson, [name].   Established patient visit   Patient: Wendy Green   DOB: 05/12/1976   47 y.o. Female  MRN: [id number] Visit Date: 01/15/2023  Today's healthcare provider: Alfredia Ferguson, [name]   Cc. Left ear pain x 7 days  Subjective    Otalgia  There is pain in the left ear. This is a new problem. The current episode started in the past 7 days. The problem occurs constantly. The problem has been gradually worsening. There has been no fever. Associated symptoms include hearing loss. She has tried nothing for the symptoms.  Her mother tried using a qtip to remove the wax.   Medications: Outpatient Medications Prior to Visit  Medication Sig   acetaminophen (TYLENOL) 500 MG tablet Take 500 mg by mouth every 6 (six) hours as needed.   acetaZOLAMIDE (DIAMOX) 250 MG tablet TAKE 1 TABLET BY MOUTH 3 TIMES A DAY   fluticasone (FLONASE) 50 MCG/ACT nasal spray Place into both nostrils daily.   folic acid (FOLVITE) 1 MG tablet TAKE 1 TABLET BY MOUTH ONCE DAILY   KRILL OIL PO Take 1 capsule by mouth 2 (two) times daily.    medroxyPROGESTERone (DEPO-PROVERA) 150 MG/ML injection Inject 1 mL (150 mg total) into the muscle every 3 (three) months. Bring into office   MULTIPLE VITAMIN PO 1 tablet daily.   OXcarbazepine (TRILEPTAL PO) Take by mouth. Twice a day   primidone (MYSOLINE) 250 MG tablet Take by mouth. Three times daily   simvastatin (ZOCOR) 20 MG tablet TAKE 1 TABLET BY MOUTH AT BEDTIME. PLEASE SCHEDULE AN OFFICE VISIT BEFORE ANYMORE REFILLS.   Wheat Dextrin (BENEFIBER PO) Take by mouth. As needed   ketoconazole (NIZORAL) 2 % cream Apply 1 Application topically daily. (Patient not taking: Reported on 01/15/2023)   triamcinolone cream (KENALOG) 0.1 % APPLY TO AFFECTED AREA  TWICE A DAY (Patient not taking: Reported on 01/15/2023)   No facility-administered medications prior to visit.    Review of Systems  HENT:  Positive for ear pain and hearing loss.      Objective    BP 115/78 (BP Location: Left Arm, Patient Position: Sitting, Cuff Size: Normal)   Pulse 88   Wt 179 lb 8 oz (81.4 kg)   SpO2 99%   BMI 30.81 kg/m   Physical Exam Vitals reviewed.  Constitutional:      Appearance: She is not ill-appearing.  HENT:     Head: Normocephalic.     Right Ear: Tympanic membrane normal.     Ears:     Comments: L canal with some abrasion and blood Unable to visualize TM d/t wax; unable to flush Eyes:     Conjunctiva/sclera: Conjunctivae normal.  Cardiovascular:     Rate and Rhythm: Normal rate.  Pulmonary:     Effort: Pulmonary effort is normal. No respiratory distress.  Neurological:     General: No focal deficit present.     Mental Status: She is alert and oriented to person, place, and time.  Psychiatric:        Mood and Affect: Mood normal.        Behavior: Behavior normal.      No results found for any visits on 01/15/23.  Assessment & Plan     1. Acute otitis media, left Given pain  and hearing loss, tx as above Advised to not use qtips, compresses as needed  If no improvement after abx course to call office  - amoxicillin (AMOXIL) 875 MG tablet; Take 1 tablet (875 mg total) by mouth 2 (two) times daily for 7 days.  Dispense: 14 tablet; Refill: 0   Return if symptoms worsen or fail to improve.      I, Alfredia Ferguson, [name] have reviewed all documentation for this visit. The documentation on  01/15/23   for the exam, diagnosis, procedures, and orders are all accurate and complete.  Alfredia Ferguson, [name] [facility] [address] [place], [place], [address] Office: [phone] Fax: [phone]   [facility]

## 2023-04-01 ENCOUNTER — Ambulatory Visit (INDEPENDENT_AMBULATORY_CARE_PROVIDER_SITE_OTHER): Payer: Medicare HMO | Admitting: Physician Assistant

## 2023-04-01 DIAGNOSIS — Z3042 Encounter for surveillance of injectable contraceptive: Secondary | ICD-10-CM | POA: Diagnosis not present

## 2023-04-01 MED ORDER — MEDROXYPROGESTERONE ACETATE 150 MG/ML IM SUSY
150.0000 mg | PREFILLED_SYRINGE | Freq: Once | INTRAMUSCULAR | Status: AC
  Administered 2023-04-01: 150 mg via INTRAMUSCULAR

## 2023-04-02 NOTE — Progress Notes (Signed)
Pt presents for Depo.

## 2023-04-10 NOTE — Progress Notes (Unsigned)
Patient presented to the office with her mother during lunch hours with complaint of recent fall.  She states she was getting out of the pool this morning, slipped and fell.  She has a slight cut to her right upper cheek that is not bleeding or open.  She has swelling and some bruising.  She has been advised that it does not appear to need stitches.  Patient was alert and mother reported she did not lose consciousness or complain of other injury.   Mother and patient advised to seek treatment at the ER if patient began having any symptoms of blurred vision, nausea, confusion, etc.  Also advised ice to the lump for swelling.  Patient did not appear in pain except when I took the band-aid off to look at the cut and even then she appeared more scared than in pain.

## 2023-04-12 ENCOUNTER — Other Ambulatory Visit: Payer: Self-pay

## 2023-04-12 ENCOUNTER — Emergency Department: Payer: Medicare HMO

## 2023-04-12 ENCOUNTER — Encounter: Payer: Self-pay | Admitting: Emergency Medicine

## 2023-04-12 ENCOUNTER — Emergency Department
Admission: EM | Admit: 2023-04-12 | Discharge: 2023-04-12 | Disposition: A | Discharge status: Home / Self Care | Payer: Medicare HMO | Attending: Emergency Medicine | Admitting: Emergency Medicine

## 2023-04-12 DIAGNOSIS — S0011XA Contusion of right eyelid and periocular area, initial encounter: Secondary | ICD-10-CM | POA: Insufficient documentation

## 2023-04-12 DIAGNOSIS — S0993XA Unspecified injury of face, initial encounter: Secondary | ICD-10-CM | POA: Diagnosis not present

## 2023-04-12 DIAGNOSIS — W19XXXA Unspecified fall, initial encounter: Secondary | ICD-10-CM

## 2023-04-12 DIAGNOSIS — S0990XA Unspecified injury of head, initial encounter: Secondary | ICD-10-CM | POA: Insufficient documentation

## 2023-04-12 DIAGNOSIS — J342 Deviated nasal septum: Secondary | ICD-10-CM | POA: Diagnosis not present

## 2023-04-12 DIAGNOSIS — Y9234 Swimming pool (public) as the place of occurrence of the external cause: Secondary | ICD-10-CM | POA: Insufficient documentation

## 2023-04-12 DIAGNOSIS — S199XXA Unspecified injury of neck, initial encounter: Secondary | ICD-10-CM | POA: Diagnosis not present

## 2023-04-12 DIAGNOSIS — S058X1A Other injuries of right eye and orbit, initial encounter: Secondary | ICD-10-CM | POA: Diagnosis not present

## 2023-04-12 DIAGNOSIS — W01198A Fall on same level from slipping, tripping and stumbling with subsequent striking against other object, initial encounter: Secondary | ICD-10-CM | POA: Insufficient documentation

## 2023-04-12 DIAGNOSIS — S0083XA Contusion of other part of head, initial encounter: Secondary | ICD-10-CM | POA: Diagnosis not present

## 2023-04-12 NOTE — ED Provider Notes (Signed)
[facility] Provider Note    Event Date/Time   First MD Initiated Contact with Patient 04/12/23 1405     (approximate)   History   Fall   HPI  Wendy Green is a 47 y.o. female who presents today with facial injury. Patient reports that she tripped while getting out of the pool 2 days ago and struck her face on the side of the pool. No LOC. No nausea/vomiting. She has bruising beath her right eye and she wants to know if this will get better before her birthday next month. No visual changes or pain with moving eyes. No trouble opening mouth, eating, or drinking. No neck pain or paresthesias/weakness in extremities. No AC.   Patient Active Problem List   Diagnosis Date Noted   Encounter for screening colonoscopy 08/06/2022   Adenomatous polyp of colon 08/06/2022   Generalized idiopathic epilepsy, not intractable, without status epilepticus (HCC) 07/23/2017   Hematuria 06/04/2016   Headache, migraine 09/11/2015   Episodic mood disorder (HCC) 09/11/2015   Intellectual disability 02/24/2015   Changeable mood 02/24/2015   Cerebral seizure (HCC) 02/24/2015   Abnormal weight gain 02/24/2015   Hyperlipidemia, unspecified 05/16/2009          Physical Exam   Triage Vital Signs: ED Triage Vitals  Encounter Vitals Group     BP 04/12/23 1340 126/83     Systolic BP Percentile --      Diastolic BP Percentile --      Pulse Rate 04/12/23 1340 (!) 108     Resp 04/12/23 1340 18     Temp 04/12/23 1340 98.4 F (36.9 C)     Temp Source 04/12/23 1340 Oral     SpO2 04/12/23 1340 98 %     Weight 04/12/23 1342 160 lb (72.6 kg)     Height 04/12/23 1339 5\' 4"  (1.626 m)     Head Circumference --      Peak Flow --      Pain Score 04/12/23 1339 0     Pain Loc --      Pain Education --      Exclude from Growth Chart --     Most recent vital signs: Vitals:   04/12/23 1340 04/12/23 1527  BP: 126/83 130/86  Pulse: (!) 108 100  Resp: 18 16  Temp: 98.4 F  (36.9 C)   SpO2: 98% 98%    Physical Exam Vitals and nursing note reviewed.  Constitutional:      General: Awake and alert. No acute distress.    Appearance: Normal appearance. The patient is normal weight.  HENT:     Head: Normocephalic. Infraorbital ecchymosis noted to right eye. Normal, full, and painless EOMs. No periorbital tenderness. No midface tenderness. No mandibular tenderness. No trismus.     Mouth: Mucous membranes are moist.  Eyes:     General: PERRL. Normal EOMs        Right eye: No discharge.        Left eye: No discharge.     Conjunctiva/sclera: Conjunctivae normal.  Cardiovascular:     Rate and Rhythm: Normal rate and regular rhythm.     Pulses: Normal pulses.  Pulmonary:     Effort: Pulmonary effort is normal. No respiratory distress.     Breath sounds: Normal breath sounds.  Abdominal:     Abdomen is soft. There is no abdominal tenderness. No rebound or guarding. No distention. Musculoskeletal:        General: No  swelling. Normal range of motion.     Cervical back: Normal range of motion and neck supple. No midline cervical spine tenderness.  Full range of motion of neck.  Negative Spurling test.  Negative Lhermitte sign.  Normal strength and sensation in bilateral upper extremities. Normal grip strength bilaterally.  Normal intrinsic muscle function of the hand bilaterally.  Normal radial pulses bilaterally. Skin:    General: Skin is warm and dry.     Capillary Refill: Capillary refill takes less than 2 seconds.     Findings: No rash.  Neurological:     Mental Status: The patient is awake and alert.   Neurological: GCS 15 alert and oriented x3 Normal speech, no expressive or receptive aphasia or dysarthria Cranial nerves II through XII intact Normal visual fields 5 out of 5 strength in all 4 extremities with intact sensation throughout No extremity drift Normal finger-to-nose testing, no limb or truncal ataxia    ED Results / Procedures / Treatments    Labs (all labs ordered are listed, but only abnormal results are displayed) Labs Reviewed - No data to display   EKG     RADIOLOGY I independently reviewed and interpreted imaging and agree with radiologists findings.     PROCEDURES:  Critical Care performed:   Procedures   MEDICATIONS ORDERED IN ED: Medications - No data to display   IMPRESSION / MDM / ASSESSMENT AND PLAN / ED COURSE  I reviewed the triage vital signs and the nursing notes.   Differential diagnosis includes, but is not limited to, facial fracture, intracranial hemorrhage, hematoma.  Patient is awake and alert, hemodynamically stable and neurologically intact.  She has obvious ecchymosis to the infraorbital portion of her right eye.  CT head, neck, face obtained are negative for any acute findings.  She has full normal range of motion of the eye, normal extraocular movements, no visual changes, not consistent with entrapment.  She has no proptosis, no evidence of retrobulbar hematoma.  CT scan reveals infra orbital hematoma only.  No intracranial hemorrhage noted on CT head.  She has normal strength and sensation of bilateral upper extremities, normal grip strength, not consistent with central cord syndrome.  No radicular symptoms in her arms.  Patient was reassured by these results.  I recommended rest, cool compresses.  Patient and her guardian understand and agree with plan.  She was reassured by these findings.  She was discharged in stable condition.   Patient's presentation is most consistent with acute complicated illness / injury requiring diagnostic workup.      FINAL CLINICAL IMPRESSION(S) / ED DIAGNOSES   Final diagnoses:  Fall, initial encounter  Facial contusion, initial encounter     Rx / DC Orders   ED Discharge Orders     None        Note:  This document was prepared using Dragon voice recognition software and may include unintentional dictation errors.   Keturah Shavers 04/12/23 1836    Jene Every, MD 04/12/23 [phone]

## 2023-04-12 NOTE — Discharge Instructions (Signed)
Your CT scans were normal. Return for new, worsening, or change in symptoms or other concerns.

## 2023-04-12 NOTE — ED Triage Notes (Signed)
Pt via POV from home. Pt was coming out of the pool on Thursday and hit her R eye on the edge of the pool. Bruising noted under the R eye. States the bruising has not gotten any better. Denies vision changes. Denies blood thinner. Denies LOC after fall. Pt is A&OX4 and NAD

## 2023-04-15 ENCOUNTER — Telehealth: Payer: Self-pay | Admitting: *Deleted

## 2023-04-15 NOTE — Telephone Encounter (Signed)
Transition Care Management Unsuccessful Follow-up Telephone Call  Date of discharge and from where:  [facility] 04/12/2023  Attempts:  1st Attempt  Reason for unsuccessful TCM follow-up call:  No answer/busy

## 2023-04-16 ENCOUNTER — Telehealth: Payer: Self-pay | Admitting: *Deleted

## 2023-04-16 NOTE — Telephone Encounter (Signed)
Transition Care Management Unsuccessful Follow-up Telephone Call  Date of discharge and from where:  [facility] 04/12/2023  Attempts:  2nd Attempt  Reason for unsuccessful TCM follow-up call:  No answer/busy

## 2023-05-20 ENCOUNTER — Ambulatory Visit (INDEPENDENT_AMBULATORY_CARE_PROVIDER_SITE_OTHER): Payer: Medicare HMO | Admitting: Family Medicine

## 2023-05-20 DIAGNOSIS — Z3042 Encounter for surveillance of injectable contraceptive: Secondary | ICD-10-CM | POA: Diagnosis not present

## 2023-05-20 DIAGNOSIS — Z23 Encounter for immunization: Secondary | ICD-10-CM

## 2023-05-20 MED ORDER — MEDROXYPROGESTERONE ACETATE 150 MG/ML IM SUSY
150.0000 mg | PREFILLED_SYRINGE | Freq: Once | INTRAMUSCULAR | Status: AC
  Administered 2023-05-20: 150 mg via INTRAMUSCULAR

## 2023-05-20 NOTE — Patient Instructions (Signed)
Influenza (Flu) Vaccine (Inactivated or Recombinant): What You Need to Know Many vaccine information statements are available in Spanish and other languages. See PromoAge.com.br. 1. Why get vaccinated? Influenza vaccine can prevent influenza (flu). Flu is a contagious disease that spreads around the [place] every year, usually between October and May. Anyone can get the flu, but it is more dangerous for some people. Infants and young children, people 46 years and older, pregnant people, and people with certain health conditions or a weakened immune system are at greatest risk of flu complications. Pneumonia, bronchitis, sinus infections, and ear infections are examples of flu-related complications. If you have a medical condition, such as heart disease, cancer, or diabetes, flu can make it worse. Flu can cause fever and chills, sore throat, muscle aches, fatigue, cough, headache, and runny or stuffy nose. Some people may have vomiting and diarrhea, though this is more common in children than adults. In an average year, thousands of people in the [place] die from flu, and many more are hospitalized. Flu vaccine prevents millions of illnesses and flu-related visits to the doctor each year. 2. Influenza vaccines CDC recommends everyone 6 months and older get vaccinated every flu season. Children 6 months through 44 years of age may need 2 doses during a single flu season. Everyone else needs only 1 dose each flu season. It takes about 2 weeks for protection to develop after vaccination. There are many flu viruses, and they are always changing. Each year a new flu vaccine is made to protect against the influenza viruses believed to be likely to cause disease in the upcoming flu season. Even when the vaccine doesn't exactly match these viruses, it may still provide some protection. Influenza vaccine does not cause flu. Influenza vaccine may be given at the same time as other vaccines. 3.  Talk with your health care provider Tell your vaccination provider if the person getting the vaccine: Has had an allergic reaction after a previous dose of influenza vaccine, or has any severe, life-threatening allergies Has ever had Guillain-Barr Syndrome (also called "GBS") In some cases, your health care provider may decide to postpone influenza vaccination until a future visit. Influenza vaccine can be administered at any time during pregnancy. People who are or will be pregnant during influenza season should receive inactivated influenza vaccine. People with minor illnesses, such as a cold, may be vaccinated. People who are moderately or severely ill should usually wait until they recover before getting influenza vaccine. Your health care provider can give you more information. 4. Risks of a vaccine reaction Soreness, redness, and swelling where the shot is given, fever, muscle aches, and headache can happen after influenza vaccination. There may be a very small increased risk of Guillain-Barr Syndrome (GBS) after inactivated influenza vaccine (the flu shot). Young children who get the flu shot along with pneumococcal vaccine (PCV13) and/or DTaP vaccine at the same time might be slightly more likely to have a seizure caused by fever. Tell your health care provider if a child who is getting flu vaccine has ever had a seizure. People sometimes faint after medical procedures, including vaccination. Tell your provider if you feel dizzy or have vision changes or ringing in the ears. As with any medicine, there is a very remote chance of a vaccine causing a severe allergic reaction, other serious injury, or death. 5. What if there is a serious problem? An allergic reaction could occur after the vaccinated person leaves the clinic. If you see signs of  a severe allergic reaction (hives, swelling of the face and throat, difficulty breathing, a fast heartbeat, dizziness, or weakness), call 9-1-1 and get  the person to the nearest hospital. For other signs that concern you, call your health care provider. Adverse reactions should be reported to the Vaccine Adverse Event Reporting System (VAERS). Your health care provider will usually file this report, or you can do it yourself. Visit the VAERS website at www.vaers.LAgents.no or call 9511693751. VAERS is only for reporting reactions, and VAERS staff members do not give medical advice. 6. The National Vaccine Injury Compensation Program The [employer] Vaccine Injury Compensation Program (VICP) is a federal program that was created to compensate people who may have been injured by certain vaccines. Claims regarding alleged injury or death due to vaccination have a time limit for filing, which may be as short as two years. Visit the VICP website at SpiritualWord.at or call 367-854-3561 to learn about the program and about filing a claim. 7. How can I learn more? Ask your health care provider. Call your local or state health department. Visit the website of the Food and Drug Administration (FDA) for vaccine package inserts and additional information at FinderList.no. Contact the Centers for Disease Control and Prevention (CDC): Call (651)315-0289 (1-800-CDC-INFO) or Visit CDC's website at BiotechRoom.com.cy. Source: CDC Vaccine Information Statement Inactivated Influenza Vaccine (04/28/2020) This same material is available at FootballExhibition.com.br for no charge. This information is not intended to replace advice given to you by your health care provider. Make sure you discuss any questions you have with your health care provider. Document Revised: 12/25/2022 Document Reviewed: 09/30/2022 Elsevier Patient Education  2024 [employer].

## 2023-05-27 NOTE — Progress Notes (Signed)
Patient seen for nurse visit only.  Patient not seen or evaluated by physician.

## 2023-06-20 ENCOUNTER — Other Ambulatory Visit: Payer: Self-pay | Admitting: Physician Assistant

## 2023-06-20 DIAGNOSIS — E782 Mixed hyperlipidemia: Secondary | ICD-10-CM

## 2023-07-17 ENCOUNTER — Encounter: Payer: Self-pay | Admitting: Family Medicine

## 2023-07-17 ENCOUNTER — Ambulatory Visit: Payer: Medicare HMO | Admitting: Family Medicine

## 2023-07-17 VITALS — BP 113/72 | HR 86 | Ht 64.0 in | Wt 176.7 lb

## 2023-07-17 DIAGNOSIS — Z0000 Encounter for general adult medical examination without abnormal findings: Secondary | ICD-10-CM | POA: Insufficient documentation

## 2023-07-17 DIAGNOSIS — Z3042 Encounter for surveillance of injectable contraceptive: Secondary | ICD-10-CM

## 2023-07-17 DIAGNOSIS — F39 Unspecified mood [affective] disorder: Secondary | ICD-10-CM | POA: Diagnosis not present

## 2023-07-17 DIAGNOSIS — F79 Unspecified intellectual disabilities: Secondary | ICD-10-CM

## 2023-07-17 DIAGNOSIS — G809 Cerebral palsy, unspecified: Secondary | ICD-10-CM | POA: Diagnosis not present

## 2023-07-17 DIAGNOSIS — E782 Mixed hyperlipidemia: Secondary | ICD-10-CM

## 2023-07-17 DIAGNOSIS — Z0001 Encounter for general adult medical examination with abnormal findings: Secondary | ICD-10-CM

## 2023-07-17 DIAGNOSIS — G40309 Generalized idiopathic epilepsy and epileptic syndromes, not intractable, without status epilepticus: Secondary | ICD-10-CM | POA: Diagnosis not present

## 2023-07-17 LAB — POCT URINE PREGNANCY: Preg Test, Ur: NEGATIVE

## 2023-07-17 NOTE — Assessment & Plan Note (Signed)
Patient lives with her mother, Bryann Cookingham, who is her legal guardian.

## 2023-07-17 NOTE — Assessment & Plan Note (Signed)
No acute concerns.  Continue to monitor. Will check a pregnancy test today to allow for continued prescription of Depo-Provera.

## 2023-07-17 NOTE — Progress Notes (Signed)
poc    Annual Wellness Visit     Patient: Wendy Green, Female    DOB: April 09, 1976, 47 y.o.   MRN: [id number] Visit Date: 07/17/2023  Today's Provider: Sherlyn Hay, DO   Chief Complaint  Patient presents with   Annual Exam    Last completed 07/15/22. Patient and patients mother reports general diet, exercise when out running errands walking, feeling well and sleeping well with no concerns.    Subjective    [name] is a 47 y.o. female who presents today for her Annual Wellness Visit.   HPI As above. Fell at camp and hurt her eye, so she doesn't want to go next year. Went in July.  Recent seizures? None  - historically seizure activity while asleep but not enough to cause a full on seizure.  Patient and her mother have no concerns today.   Medications: Outpatient Medications Prior to Visit  Medication Sig   acetaminophen (TYLENOL) 500 MG tablet Take 500 mg by mouth every 6 (six) hours as needed.   acetaZOLAMIDE (DIAMOX) 250 MG tablet TAKE 1 TABLET BY MOUTH 3 TIMES A DAY   fluticasone (FLONASE) 50 MCG/ACT nasal spray Place into both nostrils daily.   folic acid (FOLVITE) 1 MG tablet TAKE 1 TABLET BY MOUTH ONCE DAILY   KRILL OIL PO Take 1 capsule by mouth 2 (two) times daily.    medroxyPROGESTERone (DEPO-PROVERA) 150 MG/ML injection Inject 1 mL (150 mg total) into the muscle every 3 (three) months. Bring into office   MULTIPLE VITAMIN PO 1 tablet daily.   OXcarbazepine (TRILEPTAL PO) Take by mouth. Twice a day   primidone (MYSOLINE) 250 MG tablet Take by mouth. TAKE 1 TAB IN THE MORNING, 1 AND 1/2 TAB IN THE AFTERNOON   simvastatin (ZOCOR) 20 MG tablet TAKE 1 TABLET BY MOUTH AT BEDTIME. PLEASE SCHEDULE AN OFFICE VISIT BEFORE ANYMORE REFILLS.   Wheat Dextrin (BENEFIBER PO) Take by mouth. As needed   ketoconazole (NIZORAL) 2 % cream Apply 1 Application topically daily. (Patient not taking: Reported on 01/15/2023)   triamcinolone cream (KENALOG) 0.1 % APPLY TO  AFFECTED AREA TWICE A DAY (Patient not taking: Reported on 01/15/2023)   No facility-administered medications prior to visit.    Allergies  Allergen Reactions   Aspirin     Patient Care Team: Sherlyn Hay, DO as PCP - General (Family Medicine) Hildred Laser, MD as Referring Physician (Obstetrics and Gynecology) Morene Crocker, MD as Referring Physician (Neurology) Pa, [facility] as Consulting Physician (Dermatology)  Review of Systems  Constitutional:  Negative for appetite change, chills, fatigue and fever.  Respiratory:  Negative for chest tightness and shortness of breath.   Cardiovascular:  Negative for chest pain and palpitations.  Gastrointestinal:  Negative for abdominal pain, constipation, diarrhea, nausea and vomiting.  Skin:  Positive for color change (mild, beside right eye s/t prior fall). Negative for rash.  Neurological:  Negative for dizziness and weakness.  Psychiatric/Behavioral:  Negative for agitation, behavioral problems and sleep disturbance.          Objective    Vitals: BP 113/72 (BP Location: Right Arm, Patient Position: Sitting, Cuff Size: Normal)   Pulse 86   Ht 5\' 4"  (1.626 m)   Wt 176 lb 11.2 oz (80.2 kg)   SpO2 99%   BMI 30.33 kg/m     Physical Exam Vitals and nursing note reviewed.  Constitutional:      General: She is awake.  Appearance: Normal appearance.  HENT:     Head: Normocephalic and atraumatic.     Right Ear: Tympanic membrane, ear canal and external ear normal.     Left Ear: Tympanic membrane, ear canal and external ear normal.     Nose: Nose normal.     Mouth/Throat:     Mouth: Mucous membranes are moist.     Pharynx: Oropharynx is clear. No oropharyngeal exudate or posterior oropharyngeal erythema.  Eyes:     General: No scleral icterus.    Extraocular Movements: Extraocular movements intact.     Conjunctiva/sclera: Conjunctivae normal.     Pupils: Pupils are equal, round, and reactive to light.      Comments: Mild medial strabismus of right eye  Neck:     Thyroid: No thyromegaly or thyroid tenderness.  Cardiovascular:     Rate and Rhythm: Normal rate and regular rhythm.     Pulses: Normal pulses.     Heart sounds: Normal heart sounds.  Pulmonary:     Effort: Pulmonary effort is normal. No tachypnea, bradypnea or respiratory distress.     Breath sounds: Normal breath sounds. No stridor. No wheezing, rhonchi or rales.  Abdominal:     General: Bowel sounds are normal. There is no distension.     Palpations: Abdomen is soft. There is no mass.     Tenderness: There is no abdominal tenderness. There is no guarding.     Hernia: No hernia is present.  Musculoskeletal:     Cervical back: Normal range of motion and neck supple.     Right lower leg: No edema.     Left lower leg: No edema.  Lymphadenopathy:     Cervical: No cervical adenopathy.  Skin:    General: Skin is warm and dry.  Neurological:     Mental Status: She is alert and oriented to person, place, and time. Mental status is at baseline.  Psychiatric:        Mood and Affect: Mood normal.        Behavior: Behavior normal.     Most recent functional status assessment:    07/17/2023    9:28 AM  In your present state of health, do you have any difficulty performing the following activities:  Hearing? 0  Vision? 0  Difficulty concentrating or making decisions? 0  Walking or climbing stairs? 0  Dressing or bathing? 1  Comment trouble with her bra sometimes  Doing errands, shopping? 1   Most recent fall risk assessment:    07/17/2023    9:06 AM  Fall Risk   Falls in the past year? 0  Number falls in past yr: 0  Injury with Fall? 0  Risk for fall due to : No Fall Risks  Follow up Falls evaluation completed    Most recent depression screenings:    07/17/2023    9:06 AM 07/15/2022   10:39 AM  PHQ 2/9 Scores  PHQ - 2 Score 0 0  PHQ- 9 Score  0   Most recent cognitive screening:    07/17/2023    9:31 AM   6CIT Screen  What Year? 4 points  What month? 0 points  What time? 3 points  Count back from 20 4 points  Months in reverse 4 points  Repeat phrase 10 points  Total Score 25 points   Most recent Audit-C alcohol use screening    07/17/2023    9:27 AM  Alcohol Use Disorder Test (AUDIT)  1. How often  do you have a drink containing alcohol? 0  2. How many drinks containing alcohol do you have on a typical day when you are drinking? 0  3. How often do you have six or more drinks on one occasion? 0  AUDIT-C Score 0   A score of 3 or more in women, and 4 or more in men indicates increased risk for alcohol abuse, EXCEPT if all of the points are from question 1   Results for orders placed or performed in visit on 07/17/23  POCT urine pregnancy  Result Value Ref Range   Preg Test, Ur Negative Negative    Assessment & Plan     Annual wellness visit done today including the all of the following: Reviewed patient's Family Medical History Reviewed and updated list of patient's medical providers Assessment of cognitive impairment was done Assessed patient's functional ability Established a written schedule for health screening services Health Risk Assessent Completed and Reviewed Patient not currently on any opioid medications.  Exercise Activities and Dietary recommendations  Goals      DIET - INCREASE WATER INTAKE     Recommend increasing water intake to 4-6 glasses a day.      LIFESTYLE - DECREASE FALLS RISK     Recommend to remove any items from the home that may cause slips or trips.        Immunization History  Administered Date(s) Administered   Influenza, Seasonal, Injecte, Preservative Fre 05/20/2023   Influenza,inj,Quad PF,6+ Mos 06/06/2016, 06/17/2017, 07/29/2018, 06/22/2019, 05/15/2020, 07/13/2021, 07/15/2022   PFIZER(Purple Top)SARS-COV-2 Vaccination 12/21/2019, 01/11/2020, 07/21/2020   Tdap 07/22/2011    Health Maintenance  Topic Date Due   DTaP/Tdap/Td  (2 - Td or Tdap) 09/24/2023 (Originally 07/21/2021)   COVID-19 Vaccine (4 - 2023-24 season) 12/22/2023 (Originally 05/25/2023)   MAMMOGRAM  08/28/2023   Medicare Annual Wellness (AWV)  07/16/2024   Colonoscopy  08/06/2025   Cervical Cancer Screening (HPV/Pap Cotest)  04/13/2026   INFLUENZA VACCINE  Completed   HIV Screening  Completed   HPV VACCINES  Aged Out    Discussed health benefits of physical activity, and encouraged her to engage in regular exercise appropriate for her age and condition.    Encounter for subsequent annual wellness visit (AWV) in Medicare patient Assessment & Plan: No acute concerns.  Continue to monitor.  Orders: -     Hemoglobin A1c -     TSH Rfx on Abnormal to Free T4 -     CBC with Differential/Platelet  Mixed hyperlipidemia Assessment & Plan: Managed with simvastatin 20 mg Will repeat lipids/cmp for stability The 10-year ASCVD risk score (Arnett DK, et al., 2019) is: 0.9%    Orders: -     Comprehensive metabolic panel -     Lipid panel  Generalized idiopathic epilepsy, not intractable, without status epilepticus (HCC) Assessment & Plan: No acute concerns.  Continue to monitor. Patient follows up with Dr. Malvin Johns next month. Will check oxcarbazepine and phenobarbital levels today. Will defer any medication adjustments to Dr. Malvin Johns (neurology).  Orders: -     Phenobarbital level -     10-Hydroxycarbazepine  Episodic mood disorder (HCC) Assessment & Plan: No acute concerns.  Continue to monitor.   Intellectual disability Assessment & Plan: Patient lives with her mother, Shaquasha Kwok, who is her legal guardian.   Cerebral palsy, unspecified type [facility]) Assessment & Plan: No acute concerns.  Continue to monitor.   Encounter for surveillance of injectable contraceptive Assessment & Plan: No acute concerns.  Continue to monitor. Will check a pregnancy test today to allow for continued prescription of Depo-Provera.  Orders: -     POCT  urine pregnancy   Return in about 1 year (around 07/16/2024) for AWV, CPE.     I discussed the assessment and treatment plan with the patient  The patient was provided an opportunity to ask questions and all were answered. The patient agreed with the plan and demonstrated an understanding of the instructions.   The patient was advised to call back or seek an in-person evaluation if the symptoms worsen or if the condition fails to improve as anticipated.    Sherlyn Hay, DO  [facility] [phone] (phone) [phone] (fax)  [facility]

## 2023-07-17 NOTE — Assessment & Plan Note (Addendum)
No acute concerns.  Continue to monitor.

## 2023-07-17 NOTE — Assessment & Plan Note (Signed)
No acute concerns.  Continue to monitor. Patient follows up with Dr. Malvin Johns next month. Will check oxcarbazepine and phenobarbital levels today. Will defer any medication adjustments to Dr. Malvin Johns (neurology).

## 2023-07-17 NOTE — Assessment & Plan Note (Signed)
Managed with simvastatin 20 mg Will repeat lipids/cmp for stability The 10-year ASCVD risk score (Arnett DK, et al., 2019) is: 0.9%

## 2023-07-17 NOTE — Assessment & Plan Note (Signed)
No acute concerns.  Continue to monitor.

## 2023-07-21 LAB — PHENOBARBITAL LEVEL: Phenobarbital, Serum: 24 ug/mL (ref 15–40)

## 2023-07-21 LAB — COMPREHENSIVE METABOLIC PANEL
ALT: 24 [IU]/L (ref 0–32)
AST: 17 IU/L (ref 0–40)
Albumin: 4.2 g/dL (ref 3.9–4.9)
Alkaline Phosphatase: 84 [IU]/L (ref 44–121)
BUN/Creatinine Ratio: 19 (ref 9–23)
BUN: 11 mg/dL (ref 6–24)
Bilirubin Total: 0.2 mg/dL (ref 0.0–1.2)
CO2: 19 mmol/L — ABNORMAL LOW (ref 20–29)
Calcium: 9.4 mg/dL (ref 8.7–10.2)
Chloride: 106 mmol/L (ref 96–106)
Creatinine, Ser: 0.58 mg/dL (ref 0.57–1.00)
Globulin, Total: 3 g/dL (ref 1.5–4.5)
Glucose: 80 mg/dL (ref 70–99)
Potassium: 3.7 mmol/L (ref 3.5–5.2)
Sodium: 139 mmol/L (ref 134–144)
Total Protein: 7.2 g/dL (ref 6.0–8.5)
eGFR: 112 mL/min/{1.73_m2} (ref >59)

## 2023-07-21 LAB — CBC WITH DIFFERENTIAL/PLATELET
Basophils Absolute: 0 10*3/uL (ref 0.0–0.2)
Basos: 0 %
EOS (ABSOLUTE): 0.2 10*3/uL (ref 0.0–0.4)
Eos: 4 %
Hematocrit: 44.1 % (ref 34.0–46.6)
Hemoglobin: 14.5 g/dL (ref 11.1–15.9)
Immature Grans (Abs): 0 10*3/uL (ref 0.0–0.1)
Immature Granulocytes: 0 %
Lymphocytes Absolute: 1.4 10*3/uL (ref 0.7–3.1)
Lymphs: 29 %
MCH: 33 pg (ref 26.6–33.0)
MCHC: 32.9 g/dL (ref 31.5–35.7)
MCV: 100 fL — ABNORMAL HIGH (ref 79–97)
Monocytes Absolute: 0.5 10*3/uL (ref 0.1–0.9)
Monocytes: 11 %
Neutrophils Absolute: 2.7 10*3/uL (ref 1.4–7.0)
Neutrophils: 56 %
Platelets: 205 10*3/uL (ref 150–450)
RBC: 4.4 x10E6/uL (ref 3.77–5.28)
RDW: 12.6 % (ref 11.7–15.4)
WBC: 4.8 10*3/uL (ref 3.4–10.8)

## 2023-07-21 LAB — HEMOGLOBIN A1C
Est. average glucose Bld gHb Est-mCnc: 108 mg/dL
Hgb A1c MFr Bld: 5.4 % (ref 4.8–5.6)

## 2023-07-21 LAB — LIPID PANEL
Chol/HDL Ratio: 3.5 ratio (ref 0.0–4.4)
Cholesterol, Total: 210 mg/dL — ABNORMAL HIGH (ref 100–199)
HDL: 60 mg/dL (ref >39)
LDL Chol Calc (NIH): 119 mg/dL — ABNORMAL HIGH (ref 0–99)
Triglycerides: 175 mg/dL — ABNORMAL HIGH (ref 0–149)
VLDL Cholesterol Cal: 31 mg/dL (ref 5–40)

## 2023-07-21 LAB — TSH RFX ON ABNORMAL TO FREE T4: TSH: 2.95 u[IU]/mL (ref 0.450–4.500)

## 2023-07-21 LAB — 10-HYDROXYCARBAZEPINE: Oxcarbazepine SerPl-Mcnc: 4 ug/mL — ABNORMAL LOW (ref 10–35)

## 2023-07-23 ENCOUNTER — Encounter: Payer: Self-pay | Admitting: Family Medicine

## 2023-07-23 ENCOUNTER — Ambulatory Visit: Payer: Self-pay | Admitting: *Deleted

## 2023-07-23 NOTE — Telephone Encounter (Signed)
  Chief Complaint: patient with possible spotting - on Depo Provera Symptoms: spotting on Depo Provera Frequency: reports normally spots 1 week before depo provera is due- patient is 1 month before Depo provera is due Pertinent Negatives: Patient denies abdominal cramping Disposition: [] ED /[] Urgent Care (no appt availability in office) / [x] Appointment(In office/virtual)/ []  [facility]/ [] Home Care/ [] Refused Recommended Disposition /[] [facility]/ []  Follow-up with PCP Additional Notes: Unclear triage information-  appointment has been scheduled for evaluation- no follow up appointment for Depo provera

## 2023-07-23 NOTE — Telephone Encounter (Signed)
//   Summary: Spotting some/wondering if it's time for a depo injection   Patient's mother Aurea Graff called and thinks patient is due to have a depo injection again. Patient's mother states patient is spotting some  (no other symptoms) and wants to know the last time she had a depo shot. Please advise.   Patient's callback # [phone] (Joan's phone number)     Reason for Disposition . Vaginal bleeding lasts > 7 days    Unknown is vaginal bleeding- possible vaginal discharge. Unclear information- appointment scheduled  Answer Assessment - Initial Assessment Questions 1. TYPE: "What type of birth control shot are you getting?"  (e.g., Depo-Provera or Depo-subQ Provera 104 shot given every 3 months)     Depo provera- patient reports she has been spotting for at least one week- very light amount-wearing liner 2. START DATE: "When did you first start getting the birth control shot?" (e.g., date; weeks, months, years ago)  When was the last shot?"     Last injection 8/27- due 11/12-26- appointment scheduled 11/24 3. SYMPTOM: "What is the main symptom (or question) you're concerned about?"     Spotting only-   6. VAGINAL BLEEDING: "Are you having any unusual vaginal bleeding?"   - NONE: no bleeding   - SPOTTING: spotting, or pinkish / brownish mucous discharge; does not fill panty liner or pad    - MILD:  less than 1 pad / hour; less than patient's usual menstrual bleeding   - MODERATE: 1-2 pads / hour; 1 menstrual cup every 6 hours; small-medium blood clots (e.g., pea, grape, small coin)   - SEVERE: soaking 2 or more pads/hour for 2 or more hours; 1 menstrual cup every 2 hours; bleeding not contained by pads or continuous red blood from vagina; large blood clots (e.g., golf ball, large coin)      spotting 7. ABDOMEN OR PELVIC PAIN: "Are you have any pain in your abdomen or pelvic area?" (Scale: 0, 1-10; none, mild, moderate, severe)   - NONE (0): no pain   - MILD (1-3): doesn't interfere with  normal activities, abdomen soft and not tender to touch    - MODERATE (4-7): interferes with normal activities or awakens from sleep, abdomen tender to touch    - SEVERE (8-10): excruciating pain, doubled over, unable to do any normal activities      none  Protocols used: Contraception - Passenger transport manager Symptoms and [employer]

## 2023-07-25 ENCOUNTER — Encounter: Payer: Self-pay | Admitting: Family Medicine

## 2023-07-25 ENCOUNTER — Ambulatory Visit: Payer: Medicare HMO | Admitting: Family Medicine

## 2023-07-25 VITALS — BP 124/82 | HR 72 | Ht 64.0 in | Wt 175.0 lb

## 2023-07-25 DIAGNOSIS — N921 Excessive and frequent menstruation with irregular cycle: Secondary | ICD-10-CM | POA: Diagnosis not present

## 2023-07-25 DIAGNOSIS — R350 Frequency of micturition: Secondary | ICD-10-CM | POA: Diagnosis not present

## 2023-07-25 NOTE — Assessment & Plan Note (Addendum)
Patient is not due for Depo-Provera until 08/20/2023, having received the prior 05/20/2023. Counseled the patient and her mother that it is not beneficial to shorten the frequency between doses of Depo-Provera to reduce bleeding.    - Counseled on available options of observation versus high-dose NSAID versus a short course of oral estrogen-progesterone combination contraceptive. Patient's maternal aunts went through menopause in their mid 75s (her mom's natural menopause age is unknown due to total hysterectomy in her early 32s). Based on this and patient's age, also counseled them that the patient may have continued her Depo-Provera straight through menopause.    - Recommended that we go ahead and hold the next dose of Depo-Provera and wait until we develop a sense of what her underlying hormonal pattern and menstrual cycle looks like before considering her next step. Did reassure patient that, if she resumes having very heavy bleeding that we can restart the Depo-Provera at that time, as she was very concerned about this.

## 2023-07-25 NOTE — Progress Notes (Signed)
Established patient visit   Patient: Wendy Green   DOB: 1976/08/06   47 y.o. Female  MRN: [id number] Visit Date: 07/25/2023  Today's healthcare provider: Sherlyn Hay, DO   Chief Complaint  Patient presents with   Vaginal Bleeding   Subjective    HPI Vaginal spotting - noticed a couple weeks ago.  Pt's mom had total hysterectomy in her early 44s.  Patient's maternal aunts hit menopause somewhere around their mid-40s. Wears a pad but the spotting only occurs in the toilet. Has been on depo over a decade  Pain? No pain Discharge? No Itching? No Denies sexual activity.  First period: 47 Yo  Sarna - has been placing it on her feet for itching     Medications: Outpatient Medications Prior to Visit  Medication Sig   acetaminophen (TYLENOL) 500 MG tablet Take 500 mg by mouth every 6 (six) hours as needed.   acetaZOLAMIDE (DIAMOX) 250 MG tablet TAKE 1 TABLET BY MOUTH 3 TIMES A DAY   fluticasone (FLONASE) 50 MCG/ACT nasal spray Place into both nostrils daily.   folic acid (FOLVITE) 1 MG tablet TAKE 1 TABLET BY MOUTH ONCE DAILY   KRILL OIL PO Take 1 capsule by mouth 2 (two) times daily.    MULTIPLE VITAMIN PO 1 tablet daily.   OXcarbazepine (TRILEPTAL PO) Take by mouth. Twice a day   primidone (MYSOLINE) 250 MG tablet Take by mouth. TAKE 1 TAB IN THE MORNING, 1 AND 1/2 TAB IN THE AFTERNOON   simvastatin (ZOCOR) 20 MG tablet TAKE 1 TABLET BY MOUTH AT BEDTIME. PLEASE SCHEDULE AN OFFICE VISIT BEFORE ANYMORE REFILLS.   Wheat Dextrin (BENEFIBER PO) Take by mouth. As needed   [DISCONTINUED] ketoconazole (NIZORAL) 2 % cream Apply 1 Application topically daily. (Patient not taking: Reported on 01/15/2023)   [DISCONTINUED] medroxyPROGESTERone (DEPO-PROVERA) 150 MG/ML injection Inject 1 mL (150 mg total) into the muscle every 3 (three) months. Bring into office   [DISCONTINUED] triamcinolone cream (KENALOG) 0.1 % APPLY TO AFFECTED AREA TWICE A DAY (Patient not taking:  Reported on 01/15/2023)   No facility-administered medications prior to visit.       Objective    BP 124/82 (BP Location: Right Arm, Patient Position: Sitting, Cuff Size: Normal)   Pulse 72   Ht 5\' 4"  (1.626 m)   Wt 175 lb (79.4 kg)   SpO2 98%   BMI 30.04 kg/m     Physical Exam Vitals and nursing note reviewed.  Constitutional:      General: She is not in acute distress.    Appearance: Normal appearance.  HENT:     Head: Normocephalic and atraumatic.  Eyes:     General: No scleral icterus.    Conjunctiva/sclera: Conjunctivae normal.  Cardiovascular:     Rate and Rhythm: Normal rate.  Pulmonary:     Effort: Pulmonary effort is normal.  Neurological:     Mental Status: She is alert and oriented to person, place, and time. Mental status is at baseline.  Psychiatric:        Mood and Affect: Mood normal.        Behavior: Behavior normal.      No results found for any visits on 07/25/23.  Assessment & Plan    Breakthrough bleeding on depo provera Assessment & Plan: Patient is not due for Depo-Provera until 08/20/2023, having received the prior 05/20/2023. Counseled the patient and her mother that it is not beneficial to shorten the frequency between doses  of Depo-Provera to reduce bleeding.    - Counseled on available options of observation versus high-dose NSAID versus a short course of oral estrogen-progesterone combination contraceptive. Patient's maternal aunts went through menopause in their mid 92s (her mom's natural menopause age is unknown due to total hysterectomy in her early 16s). Based on this and patient's age, also counseled them that the patient may have continued her Depo-Provera straight through menopause.    - Recommended that we go ahead and hold the next dose of Depo-Provera and wait until we develop a sense of what her underlying hormonal pattern and menstrual cycle looks like before considering her next step. Did reassure patient that, if she resumes  having very heavy bleeding that we can restart the Depo-Provera at that time, as she was very concerned about this.   Urinary frequency  The patient and her mother both endorse that the patient seems to be urinating somewhat more frequently than normal, they both declined to do a urinalysis to check for infection today.    Return if symptoms worsen or fail to improve.      I discussed the assessment and treatment plan with the patient  The patient was provided an opportunity to ask questions and all were answered. The patient agreed with the plan and demonstrated an understanding of the instructions.   The patient was advised to call back or seek an in-person evaluation if the symptoms worsen or if the condition fails to improve as anticipated.  Total time was 30 minutes. That includes chart review before the visit, the actual patient visit, and time spent on documentation after the visit.    Sherlyn Hay, DO  [facility] [phone] (phone) [phone] (fax)  [facility]

## 2023-08-04 ENCOUNTER — Other Ambulatory Visit: Payer: Self-pay | Admitting: Family Medicine

## 2023-08-04 DIAGNOSIS — Z1231 Encounter for screening mammogram for malignant neoplasm of breast: Secondary | ICD-10-CM

## 2023-08-27 ENCOUNTER — Other Ambulatory Visit: Payer: Self-pay | Admitting: Obstetrics and Gynecology

## 2023-09-03 ENCOUNTER — Emergency Department: Payer: Medicare HMO

## 2023-09-03 ENCOUNTER — Emergency Department
Admission: EM | Admit: 2023-09-03 | Discharge: 2023-09-03 | Disposition: A | Discharge status: Home / Self Care | Payer: Medicare HMO | Attending: Emergency Medicine | Admitting: Emergency Medicine

## 2023-09-03 ENCOUNTER — Other Ambulatory Visit: Payer: Self-pay

## 2023-09-03 DIAGNOSIS — R569 Unspecified convulsions: Secondary | ICD-10-CM | POA: Insufficient documentation

## 2023-09-03 DIAGNOSIS — S0990XA Unspecified injury of head, initial encounter: Secondary | ICD-10-CM | POA: Insufficient documentation

## 2023-09-03 DIAGNOSIS — S0512XA Contusion of eyeball and orbital tissues, left eye, initial encounter: Secondary | ICD-10-CM | POA: Diagnosis not present

## 2023-09-03 DIAGNOSIS — S0993XA Unspecified injury of face, initial encounter: Secondary | ICD-10-CM | POA: Diagnosis not present

## 2023-09-03 DIAGNOSIS — W01198A Fall on same level from slipping, tripping and stumbling with subsequent striking against other object, initial encounter: Secondary | ICD-10-CM | POA: Insufficient documentation

## 2023-09-03 DIAGNOSIS — R410 Disorientation, unspecified: Secondary | ICD-10-CM | POA: Diagnosis not present

## 2023-09-03 DIAGNOSIS — G4489 Other headache syndrome: Secondary | ICD-10-CM | POA: Diagnosis not present

## 2023-09-03 DIAGNOSIS — S199XXA Unspecified injury of neck, initial encounter: Secondary | ICD-10-CM | POA: Diagnosis not present

## 2023-09-03 DIAGNOSIS — R58 Hemorrhage, not elsewhere classified: Secondary | ICD-10-CM | POA: Diagnosis not present

## 2023-09-03 DIAGNOSIS — S01112A Laceration without foreign body of left eyelid and periocular area, initial encounter: Secondary | ICD-10-CM | POA: Diagnosis not present

## 2023-09-03 LAB — CBC WITH DIFFERENTIAL/PLATELET
Abs Immature Granulocytes: 0.03 10*3/uL (ref 0.00–0.07)
Basophils Absolute: 0 10*3/uL (ref 0.0–0.1)
Basophils Relative: 0 %
Eosinophils Absolute: 0 10*3/uL (ref 0.0–0.5)
Eosinophils Relative: 0 %
HCT: 42 % (ref 36.0–46.0)
Hemoglobin: 14.2 g/dL (ref 12.0–15.0)
Immature Granulocytes: 0 %
Lymphocytes Relative: 10 %
Lymphs Abs: 0.9 10*3/uL (ref 0.7–4.0)
MCH: 33 pg (ref 26.0–34.0)
MCHC: 33.8 g/dL (ref 30.0–36.0)
MCV: 97.7 fL (ref 80.0–100.0)
Monocytes Absolute: 0.4 10*3/uL (ref 0.1–1.0)
Monocytes Relative: 5 %
Neutro Abs: 7.8 10*3/uL — ABNORMAL HIGH (ref 1.7–7.7)
Neutrophils Relative %: 85 %
Platelets: 222 10*3/uL (ref 150–400)
RBC: 4.3 MIL/uL (ref 3.87–5.11)
RDW: 13 % (ref 11.5–15.5)
WBC: 9.3 10*3/uL (ref 4.0–10.5)
nRBC: 0 % (ref 0.0–0.2)

## 2023-09-03 LAB — COMPREHENSIVE METABOLIC PANEL
ALT: 23 U/L (ref 0–44)
AST: 24 U/L (ref 15–41)
Albumin: 3.6 g/dL (ref 3.5–5.0)
Alkaline Phosphatase: 59 U/L (ref 38–126)
Anion gap: 13 (ref 5–15)
BUN: 10 mg/dL (ref 6–20)
CO2: 21 mmol/L — ABNORMAL LOW (ref 22–32)
Calcium: 9.2 mg/dL (ref 8.9–10.3)
Chloride: 103 mmol/L (ref 98–111)
Creatinine, Ser: 0.56 mg/dL (ref 0.44–1.00)
GFR, Estimated: >60 mL/min (ref >60)
Glucose, Bld: 142 mg/dL — ABNORMAL HIGH (ref 70–99)
Potassium: 3.4 mmol/L — ABNORMAL LOW (ref 3.5–5.1)
Sodium: 137 mmol/L (ref 135–145)
Total Bilirubin: 0.6 mg/dL (ref <1.2)
Total Protein: 7.3 g/dL (ref 6.5–8.1)

## 2023-09-03 LAB — HCG, QUANTITATIVE, PREGNANCY: hCG, Beta Chain, Quant, S: 1 m[IU]/mL (ref <5)

## 2023-09-03 LAB — LIPASE, BLOOD: Lipase: 30 U/L (ref 11–51)

## 2023-09-03 MED ORDER — METOCLOPRAMIDE HCL 5 MG/ML IJ SOLN
10.0000 mg | Freq: Once | INTRAMUSCULAR | Status: AC
  Administered 2023-09-03: 10 mg via INTRAVENOUS
  Filled 2023-09-03: qty 2

## 2023-09-03 MED ORDER — IOHEXOL 300 MG/ML  SOLN: 100.0000 mL | Freq: Once | INTRAMUSCULAR | Status: DC | PRN

## 2023-09-03 NOTE — ED Notes (Signed)
Dr. Rosalia Hammers at bedside for assessment, pt. Began vomiting, per dr. Rosalia Hammers, c-collar removed.

## 2023-09-03 NOTE — ED Notes (Signed)
Pt. To CT

## 2023-09-03 NOTE — Discharge Instructions (Signed)
You were seen in the ER today after a seizure-like episode. Please make sure to take your medication as directed. Let your neurologist know you were seen in the ER keep your scheduled follow-up with them in 2 days.  Please do not drive, swim or bathe unsupervised, or climb to heights for six months or until otherwise instructed by your doctor.  Return to the ER for new or worsening symptoms.  We have skin glue to close the laceration of your eyebrow.  This should fall off on its own in about a week.  You can shower, but avoid submerging the area in a pool or with a bath.

## 2023-09-03 NOTE — ED Provider Notes (Signed)
[facility] Provider Note    Event Date/Time   First MD Initiated Contact with Patient 09/03/23 1138     (approximate)   History   Seizures   HPI  Wendy Green is a 47 year old female with history of seizure disorder, mood disorder, intellectual dependability presenting to the emergency department for evaluation after witnessed seizure-like episode.  Come today mom who provides history.  She reports that patient was seated in a chair when she had a 2 to 3-minute episode of stiffening without tonic-clonic activity, reports that this is typical for her seizures.  During the episode she did fall and hit the left side of her head.  Has been confused since then.  Tetanus up-to-date.  Mom reports the patient has not had a seizure in several years, but does have follow-up with their neurologist on Friday.    Physical Exam   Triage Vital Signs: ED Triage Vitals  Encounter Vitals Group     BP 09/03/23 1115 107/76     Systolic BP Percentile --      Diastolic BP Percentile --      Pulse Rate 09/03/23 1126 92     Resp 09/03/23 1126 18     Temp 09/03/23 1126 99 F (37.2 C)     Temp Source 09/03/23 1126 Axillary     SpO2 09/03/23 1126 95 %     Weight 09/03/23 1129 170 lb (77.1 kg)     Height 09/03/23 1129 5\' 5"  (1.651 m)     Head Circumference --      Peak Flow --      Pain Score --      Pain Loc --      Pain Education --      Exclude from Growth Chart --     Most recent vital signs: Vitals:   09/03/23 1145 09/03/23 1200  BP:  108/72  Pulse: 86 87  Resp: (!) 29 17  Temp:    SpO2: 98% 100%    Nursing notes and vital signs reviewed.  General: Adult female, laying in bed, awake, frequently moaning Head: Swelling over the left periorbital area with small laceration over the eyebrow without significant associated gaping, mild bleeding, controlled by direct pressure, intact extraocular movements, pupils equal and reactive bilaterally. Chest:  Symmetric chest rise, no appreciable tenderness to palpation.  Cardiac: Regular rhythm and rate.  Respiratory: Lungs clear to auscultation Abdomen: Soft, nondistended.  Moans when palpating abdomen, but difficult to localize pain  MSK: No deformity to bilateral upper and lower extremity.  Appears to be moving extremity spontaneously without pain  Neuro: Frequently moaning, intermittently will speak in sentences appropriate to context without slurred speech or appreciable aphasia, not clearly following commands Skin: No evidence of burns    ED Results / Procedures / Treatments   Labs (all labs ordered are listed, but only abnormal results are displayed) Labs Reviewed  CBC WITH DIFFERENTIAL/PLATELET - Abnormal; Notable for the following components:      Result Value   Neutro Abs 7.8 (*)    All other components within normal limits  COMPREHENSIVE METABOLIC PANEL - Abnormal; Notable for the following components:   Potassium 3.4 (*)    CO2 21 (*)    Glucose, Bld 142 (*)    All other components within normal limits  LIPASE, BLOOD  HCG, QUANTITATIVE, PREGNANCY  POC URINE PREG, ED     EKG EKG independently reviewed interpreted by myself (ER attending) demonstrates:  EKG demonstrates  normal sinus rhythm at a rate of 93, PR 128, QRS 92, QTc 465, no acute ST changes  RADIOLOGY Imaging independently reviewed and interpreted by myself demonstrates:  CT head without acute bleed CT C-spine without acute fracture CT max face without acute fracture, does demonstrate periorbital soft tissue hematoma  PROCEDURES:  Critical Care performed: No  .Laceration Repair  Date/Time: 09/03/2023 2:33 PM  Performed by: Trinna Post, MD Authorized by: Trinna Post, MD   Consent:    Consent obtained:  Verbal   Consent given by:  Patient   Risks, benefits, and alternatives were discussed: yes   Laceration details:    Location:  Face   Face location:  L eyebrow   Length (cm):  1 Treatment:     Irrigation solution:  Sterile saline Skin repair:    Repair method:  Tissue adhesive Repair type:    Repair type:  Simple    MEDICATIONS ORDERED IN ED: Medications  iohexol (OMNIPAQUE) 300 MG/ML solution 100 mL (has no administration in time range)  metoCLOPramide (REGLAN) injection 10 mg (10 mg Intravenous Given 09/03/23 1158)     IMPRESSION / MDM / ASSESSMENT AND PLAN / ED COURSE  I reviewed the triage vital signs and the nursing notes.  Differential diagnosis includes, but is not limited to, intracranial bleed, spine fracture, facial fracture, intra-abdominal trauma or other acute process, anemia, electrolyte abnormality, breakthrough seizure in the setting of medication nonadherence  Patient's presentation is most consistent with acute presentation with potential threat to life or bodily function.  47 year old female presenting to the emergency department for evaluation of seizure like episode with resultant head trauma.  Also seems to have abdominal discomfort on exam.  Labs overall reassuring.  CT head, face, spine without acute traumatic injuries.  Patient initially complaining of abdominal pain, so CT abdomen pelvis was ordered.  However, on reevaluation, patient is more awake.  She denies any abdominal pain at all.  Has not had further episodes of vomiting.  Mom does feel that the patient is back at her neurologic baseline.  Her small laceration over her forehead was washed out and closed with Dermabond.  Strict return precautions were provided.  Patient does have close outpatient follow-up with her neurologist.  Patient discharged in stable condition.    FINAL CLINICAL IMPRESSION(S) / ED DIAGNOSES   Final diagnoses:  Seizure-like activity (HCC)  Closed head injury, initial encounter  Laceration of left eyebrow, initial encounter     Rx / DC Orders   ED Discharge Orders     None        Note:  This document was prepared using Dragon voice recognition software and  may include unintentional dictation errors.   Trinna Post, MD 09/03/23 1435

## 2023-09-03 NOTE — ED Triage Notes (Signed)
Pt. To ED via EMS for seizure that led to fall off chair this AM. Pt. Has 1 inch lac to left eyebrow where she hit the ground. Pt's mother told EMS pt. Has hx of seizures, has not had one in years. Pt. Taking meds as directed. Mother states generalized seizure activity lasted several minutes and pt. Was post-ictal after. Per EMS pt. May have some learning delay, unknown if current mental status is post-ictal or baseline. 4mg  via EMS.

## 2023-09-05 DIAGNOSIS — G40309 Generalized idiopathic epilepsy and epileptic syndromes, not intractable, without status epilepticus: Secondary | ICD-10-CM | POA: Diagnosis not present

## 2023-09-17 NOTE — Progress Notes (Signed)
Presented with seizure-like activity. Pregnancy test ordered to evaluate for possible etiology including eclamptic seizures.

## 2023-10-01 ENCOUNTER — Ambulatory Visit
Admission: RE | Admit: 2023-10-01 | Discharge: 2023-10-01 | Disposition: A | Discharge status: Home / Self Care | Payer: Medicare HMO | Source: Ambulatory Visit | Attending: Family Medicine | Admitting: Family Medicine

## 2023-10-01 DIAGNOSIS — Z1231 Encounter for screening mammogram for malignant neoplasm of breast: Secondary | ICD-10-CM | POA: Insufficient documentation

## 2023-12-03 DIAGNOSIS — G40309 Generalized idiopathic epilepsy and epileptic syndromes, not intractable, without status epilepticus: Secondary | ICD-10-CM | POA: Diagnosis not present

## 2024-01-12 ENCOUNTER — Other Ambulatory Visit: Payer: Self-pay | Admitting: Family Medicine

## 2024-02-20 DIAGNOSIS — L821 Other seborrheic keratosis: Secondary | ICD-10-CM | POA: Diagnosis not present

## 2024-02-20 DIAGNOSIS — B079 Viral wart, unspecified: Secondary | ICD-10-CM | POA: Diagnosis not present

## 2024-02-20 DIAGNOSIS — D225 Melanocytic nevi of trunk: Secondary | ICD-10-CM | POA: Diagnosis not present

## 2024-02-20 DIAGNOSIS — D2272 Melanocytic nevi of left lower limb, including hip: Secondary | ICD-10-CM | POA: Diagnosis not present

## 2024-02-20 DIAGNOSIS — D2261 Melanocytic nevi of right upper limb, including shoulder: Secondary | ICD-10-CM | POA: Diagnosis not present

## 2024-02-20 DIAGNOSIS — D2262 Melanocytic nevi of left upper limb, including shoulder: Secondary | ICD-10-CM | POA: Diagnosis not present

## 2024-02-20 DIAGNOSIS — D2271 Melanocytic nevi of right lower limb, including hip: Secondary | ICD-10-CM | POA: Diagnosis not present

## 2024-02-20 DIAGNOSIS — D485 Neoplasm of uncertain behavior of skin: Secondary | ICD-10-CM | POA: Diagnosis not present

## 2024-03-15 DIAGNOSIS — G40309 Generalized idiopathic epilepsy and epileptic syndromes, not intractable, without status epilepticus: Secondary | ICD-10-CM | POA: Diagnosis not present

## 2024-03-15 DIAGNOSIS — G809 Cerebral palsy, unspecified: Secondary | ICD-10-CM | POA: Diagnosis not present

## 2024-05-20 ENCOUNTER — Other Ambulatory Visit: Payer: Self-pay | Admitting: Family Medicine

## 2024-05-20 DIAGNOSIS — E782 Mixed hyperlipidemia: Secondary | ICD-10-CM

## 2024-05-21 NOTE — Telephone Encounter (Signed)
 LOV 07/25/2023 NOV 07/21/24 LRF 06/20/23 LABS 09/03/23

## 2024-06-16 DIAGNOSIS — G40309 Generalized idiopathic epilepsy and epileptic syndromes, not intractable, without status epilepticus: Secondary | ICD-10-CM | POA: Diagnosis not present

## 2024-07-20 ENCOUNTER — Ambulatory Visit: Payer: Medicare HMO

## 2024-07-20 ENCOUNTER — Encounter: Payer: Self-pay | Admitting: Family Medicine

## 2024-07-20 DIAGNOSIS — Z0000 Encounter for general adult medical examination without abnormal findings: Secondary | ICD-10-CM

## 2024-07-20 DIAGNOSIS — Z1231 Encounter for screening mammogram for malignant neoplasm of breast: Secondary | ICD-10-CM

## 2024-07-20 NOTE — Patient Instructions (Signed)
 Ms. Dilger,  Thank you for taking the time for your Medicare Wellness Visit. I appreciate your continued commitment to your health goals. Please review the care plan we discussed, and feel free to reach out if I can assist you further.  Medicare recommends these wellness visits once per year to help you and your care team stay ahead of potential health issues. These visits are designed to focus on prevention, allowing your provider to concentrate on managing your acute and chronic conditions during your regular appointments.  Please note that Annual Wellness Visits do not include a physical exam. Some assessments may be limited, especially if the visit was conducted virtually. If needed, we may recommend a separate in-person follow-up with your provider.  Ongoing Care Seeing your primary care provider every 3 to 6 months helps us  monitor your health and provide consistent, personalized care.   Referrals If a referral was made during today's visit and you haven't received any updates within two weeks, please contact the referred provider directly to check on the status.  Recommended Screenings:  Health Maintenance  Topic Date Due   Hepatitis B Vaccine (1 of 3 - 19+ 3-dose series) Never done   DTaP/Tdap/Td vaccine (2 - Td or Tdap) 07/21/2021   Flu Shot  04/23/2024   COVID-19 Vaccine (5 - 2025-26 season) 05/24/2024   Breast Cancer Screening  09/30/2024   Medicare Annual Wellness Visit  07/20/2025   Colon Cancer Screening  08/06/2025   Pap with HPV screening  04/13/2026   HIV Screening  Completed   Pneumococcal Vaccine  Aged Out   HPV Vaccine  Aged Out   Meningitis B Vaccine  Aged Out       07/20/2024    9:02 AM  Advanced Directives  Does Patient Have a Medical Advance Directive? No  Would patient like information on creating a medical advance directive? No - Patient declined   Advance Care Planning is important because it: Ensures you receive medical care that aligns with your  values, goals, and preferences. Provides guidance to your family and loved ones, reducing the emotional burden of decision-making during critical moments.  Vision: Annual vision screenings are recommended for early detection of glaucoma, cataracts, and diabetic retinopathy. These exams can also reveal signs of chronic conditions such as diabetes and high blood pressure.  Dental: Annual dental screenings help detect early signs of oral cancer, gum disease, and other conditions linked to overall health, including heart disease and diabetes.  Please see the attached documents for additional preventive care recommendations.   NEXT AWV 07/26/25 @ 8:50 AM BY VIDEO

## 2024-07-20 NOTE — Progress Notes (Signed)
 Subjective:   Wendy Green is a 48 y.o. who presents for a Medicare Wellness preventive visit.  As a reminder, Annual Wellness Visits don't include a physical exam, and some assessments may be limited, especially if this visit is performed virtually. We may recommend an in-person follow-up visit with your provider if needed.  Visit Complete: Virtual I connected with  Wendy Green on 07/20/24 by a audio enabled telemedicine application and verified that I am speaking with the correct person using two identifiers.  Patient Location: Home  Provider Location: Office/Clinic  I discussed the limitations of evaluation and management by telemedicine. The patient expressed understanding and agreed to proceed.  Vital Signs: Because this visit was a virtual/telehealth visit, some criteria may be missing or patient reported. Any vitals not documented were not able to be obtained and vitals that have been documented are patient reported.  VideoDeclined- This patient declined Librarian, academic. Therefore the visit was completed with audio only.  Persons Participating in Visit: Patient assisted by mom.  AWV Questionnaire: No: Patient Medicare AWV questionnaire was not completed prior to this visit.  Cardiac Risk Factors include: advanced age (>19men, >56 women);dyslipidemia;sedentary lifestyle     Objective:    There were no vitals filed for this visit. There is no height or weight on file to calculate BMI.     07/20/2024    9:02 AM 09/03/2023   11:30 AM 04/12/2023    1:40 PM 08/06/2022    8:04 AM 03/20/2020    2:55 PM 03/17/2019   10:59 AM 01/01/2018   10:21 AM  Advanced Directives  Does Patient Have a Medical Advance Directive? No No No Yes Yes Yes No   Type of Merchandiser, Retail of [place];Living will Healthcare Power of [place];Living will   Copy of Healthcare Power of Attorney in Chart?     Yes - validated most recent copy  scanned in chart (See row information) Yes - validated most recent copy scanned in chart (See row information)    Would patient like information on creating a medical advance directive? No - Patient declined      No - Patient declined      Data saved with a previous flowsheet row definition    Current Medications (verified) Outpatient Encounter Medications as of 07/20/2024  Medication Sig   acetaminophen (TYLENOL) 500 MG tablet Take 500 mg by mouth every 6 (six) hours as needed.   acetaZOLAMIDE (DIAMOX) 250 MG tablet TAKE 1 TABLET BY MOUTH 3 TIMES A DAY   folic acid  (FOLVITE ) 1 MG tablet TAKE 1 TABLET BY MOUTH ONCE DAILY   KRILL OIL PO Take 1 capsule by mouth 2 (two) times daily.    MULTIPLE VITAMIN PO 1 tablet daily.   OXcarbazepine  (TRILEPTAL ) 150 MG tablet Take 150 mg by mouth at bedtime.   primidone (MYSOLINE) 250 MG tablet Take by mouth. TAKE 1 TAB IN THE MORNING, 1 AND 1/2 TAB IN THE AFTERNOON   simvastatin  (ZOCOR ) 20 MG tablet Take 1 tablet (20 mg total) by mouth at bedtime.   Wheat Dextrin (BENEFIBER PO) Take by mouth. As needed   fluticasone (FLONASE) 50 MCG/ACT nasal spray Place into both nostrils daily. (Patient not taking: Reported on 07/20/2024)   No facility-administered encounter medications on file as of 07/20/2024.    Allergies (verified) Aspirin   History: Past Medical History:  Diagnosis Date   Abnormal weight gain 02/24/2015   Cerebral seizure (HCC) 02/24/2015  dx at age 14.    Changeable mood 02/24/2015   Epilepsy (HCC) 05/16/2009   Episodic mood disorder 09/11/2015   Family history of adverse reaction to anesthesia    mother has nausea and vomiting.   Headache, migraine 09/11/2015   Hematuria 06/04/2016   High cholesterol    History of kidney stones    HLD (hyperlipidemia) 05/16/2009   Intellectual disability 02/24/2015   Seborrheic dermatitis of scalp    Seizure (HCC) 09/11/2015   Past Surgical History:  Procedure Laterality Date   COLONOSCOPY WITH PROPOFOL   N/A 08/06/2022   Procedure: COLONOSCOPY WITH PROPOFOL ;  Surgeon: Therisa Bi, MD;  Location: [facility] ENDOSCOPY;  Service: Gastroenterology;  Laterality: N/A;   CYSTOSCOPY W/ RETROGRADES Bilateral 07/16/2016   Procedure: CYSTOSCOPY WITH RETROGRADE PYELOGRAM;  Surgeon: Rosina Riis, MD;  Location: [facility] ORS;  Service: Urology;  Laterality: Bilateral;   NO PAST SURGERIES     Family History  Problem Relation Age of Onset   Heart attack Father    Breast cancer Maternal Aunt    Stroke Maternal Grandmother    Pneumonia Maternal Grandfather    Cancer Paternal Grandfather    Prostate cancer Neg Hx    Kidney cancer Neg Hx    Ovarian cancer Neg Hx    Colon cancer Neg Hx    Diabetes Neg Hx    Social History   Socioeconomic History   Marital status: Single    Spouse name: Not on file   Number of children: 0   Years of education: Not on file   Highest education level: 12th grade  Occupational History   Occupation: none  Tobacco Use   Smoking status: Never   Smokeless tobacco: Never  Vaping Use   Vaping status: Never Used  Substance and Sexual Activity   Alcohol use: No    Alcohol/week: 0.0 standard drinks of alcohol   Drug use: No   Sexual activity: Not Currently    Birth control/protection: None, Injection  Other Topics Concern   Not on file  Social History Narrative   Not on file   Social Drivers of Health   Financial Resource Strain: Low Risk  (07/20/2024)   Overall Financial Resource Strain (CARDIA)    Difficulty of Paying Living Expenses: Not hard at all  Food Insecurity: No Food Insecurity (07/20/2024)   Hunger Vital Sign    Worried About Running Out of Food in the Last Year: Never true    Ran Out of Food in the Last Year: Never true  Transportation Needs: No Transportation Needs (07/20/2024)   PRAPARE - Administrator, Civil Service (Medical): No    Lack of Transportation (Non-Medical): No  Physical Activity: Inactive (07/20/2024)   Exercise Vital Sign     Days of Exercise per Week: 0 days    Minutes of Exercise per Session: 0 min  Stress: No Stress Concern Present (07/20/2024)   [employer] of Occupational Health - Occupational Stress Questionnaire    Feeling of Stress: Not at all  Social Connections: Socially Isolated (07/20/2024)   Social Connection and Isolation Panel    Frequency of Communication with Friends and Family: Three times a week    Frequency of Social Gatherings with Friends and Family: Once a week    Attends Religious Services: Never    Database Administrator or Organizations: No    Attends Banker Meetings: Never    Marital Status: Never married    Tobacco Counseling Counseling given: Not Answered  Clinical Intake:  Pre-visit preparation completed: Yes  Pain : No/denies pain     Nutritional Risks: None Diabetes: No  Lab Results  Component Value Date   HGBA1C 5.4 07/17/2023   HGBA1C 5.2 07/15/2022     How often do you need to have someone help you when you read instructions, pamphlets, or other written materials from your doctor or pharmacy?: 1 - Never  Interpreter Needed?: No  Information entered by :: JHONNIE DAS, LPN   Activities of Daily Living     07/20/2024    9:03 AM  In your present state of health, do you have any difficulty performing the following activities:  Hearing? 0  Vision? 0  Difficulty concentrating or making decisions? 0  Walking or climbing stairs? 1  Dressing or bathing? 0  Doing errands, shopping? 1  Preparing Food and eating ? N  Using the Toilet? N  In the past six months, have you accidently leaked urine? N  Do you have problems with loss of bowel control? N  Managing your Medications? Y  Managing your Finances? Y  Housekeeping or managing your Housekeeping? Y    Patient Care Team: Donzella Lauraine SAILOR, DO as PCP - General (Family Medicine) Connell Davies, MD (Inactive) as Referring Physician (Obstetrics and Gynecology) Lane Arthea BRAVO, MD  as Referring Physician (Neurology) [facility], [facility] as Consulting Physician (Dermatology) [facility], [facility] (Optometry)  I have updated your Care Teams any recent Medical Services you may have received from other providers in the past year.     Assessment:   This is a routine wellness examination for [place].  Hearing/Vision screen Hearing Screening - Comments:: NO AIDS Vision Screening - Comments:: READERS- [facility], ONCE PER YEAR   Goals Addressed             This Visit's Progress    DIET - EAT MORE FRUITS AND VEGETABLES         Depression Screen     07/20/2024    9:00 AM 07/17/2023    9:06 AM 07/15/2022   10:39 AM 02/12/2022   11:08 AM 07/13/2021    9:48 AM 03/20/2020    2:50 PM 03/17/2019   11:01 AM  PHQ 2/9 Scores  PHQ - 2 Score 0 0 0 0 0 0 0  PHQ- 9 Score 0  0 0 0      Fall Risk     07/20/2024    9:03 AM 07/17/2023    9:06 AM 07/15/2022   10:39 AM 02/12/2022   11:08 AM 07/13/2021    9:49 AM  Fall Risk   Falls in the past year? 0 0 0 1 0  Number falls in past yr: 0 0 0 0 0  Injury with Fall? 0 0 0 0 0  Risk for fall due to : Impaired balance/gait No Fall Risks No Fall Risks History of fall(s)   Follow up Falls evaluation completed;Falls prevention discussed Falls evaluation completed Falls evaluation completed        Data saved with a previous flowsheet row definition    MEDICARE RISK AT HOME:  Medicare Risk at Home Any stairs in or around the home?: Yes If so, are there any without handrails?: No Home free of loose throw rugs in walkways, pet beds, electrical cords, etc?: Yes Adequate lighting in your home to reduce risk of falls?: Yes Life alert?: No Use of a cane, walker or w/c?: No Grab bars in the bathroom?: No Shower chair or bench  in shower?: No Elevated toilet seat or a handicapped toilet?: No  TIMED UP AND GO:  Was the test performed?  No  Cognitive Function: PT DECLINED TO TAKE TEST- A & O X 3        07/17/2023     9:31 AM  6CIT Screen  What Year? 4 points  What month? 0 points  What time? 3 points  Count back from 20 4 points  Months in reverse 4 points  Repeat phrase 10 points  Total Score 25 points    Immunizations Immunization History  Administered Date(s) Administered   Influenza, Seasonal, Injecte, Preservative Fre 05/20/2023   Influenza,inj,Quad PF,6+ Mos 06/06/2016, 06/17/2017, 07/29/2018, 06/22/2019, 05/15/2020, 07/13/2021, 07/15/2022   PFIZER(Purple Top)SARS-COV-2 Vaccination 12/21/2019, 01/11/2020, 07/21/2020   Pfizer Covid-19 Vaccine Bivalent Booster 46yrs & up 09/30/2021   Tdap 07/22/2011    Screening Tests Health Maintenance  Topic Date Due   Hepatitis B Vaccines 19-59 Average Risk (1 of 3 - 19+ 3-dose series) Never done   DTaP/Tdap/Td (2 - Td or Tdap) 07/21/2021   Influenza Vaccine  04/23/2024   COVID-19 Vaccine (5 - 2025-26 season) 05/24/2024   Mammogram  09/30/2024   Medicare Annual Wellness (AWV)  07/20/2025   Colonoscopy  08/06/2025   Cervical Cancer Screening (HPV/Pap Cotest)  04/13/2026   HIV Screening  Completed   Pneumococcal Vaccine  Aged Out   HPV VACCINES  Aged Out   Meningococcal B Vaccine  Aged Out    Health Maintenance Items Addressed: NEEDS TDAP & FLU; UTD ON COLONOSCOPY; MAMMOGRAM ORDERED  Additional Screening:  Vision Screening: Recommended annual ophthalmology exams for early detection of glaucoma and other disorders of the eye. Is the patient up to date with their annual eye exam?  Yes  Who is the provider or what is the name of the office in which the patient attends annual eye exams? [facility]  Dental Screening: Recommended annual dental exams for proper oral hygiene  Community Resource Referral / Chronic Care Management: CRR required this visit?  No   CCM required this visit?  No   Plan:    I have personally reviewed and noted the following in the patient's chart:   Medical and social history Use of alcohol, tobacco or  illicit drugs  Current medications and supplements including opioid prescriptions. Patient is not currently taking opioid prescriptions. Functional ability and status Nutritional status Physical activity Advanced directives List of other physicians Hospitalizations, surgeries, and ER visits in previous 12 months Vitals Screenings to include cognitive, depression, and falls Referrals and appointments  In addition, I have reviewed and discussed with patient certain preventive protocols, quality metrics, and best practice recommendations. A written personalized care plan for preventive services as well as general preventive health recommendations were provided to patient.   Jhonnie GORMAN Das, LPN   [id number]   After Visit Summary: (MyChart) Due to this being a telephonic visit, the after visit summary with patients personalized plan was offered to patient via MyChart   Notes: MAMMOGRAM ORDERED

## 2024-07-21 ENCOUNTER — Ambulatory Visit (INDEPENDENT_AMBULATORY_CARE_PROVIDER_SITE_OTHER): Admitting: Family Medicine

## 2024-07-21 ENCOUNTER — Encounter: Payer: Self-pay | Admitting: Family Medicine

## 2024-07-21 VITALS — BP 122/67 | HR 90 | Temp 97.6°F | Ht 64.0 in | Wt 180.0 lb

## 2024-07-21 DIAGNOSIS — Z79899 Other long term (current) drug therapy: Secondary | ICD-10-CM

## 2024-07-21 DIAGNOSIS — G40309 Generalized idiopathic epilepsy and epileptic syndromes, not intractable, without status epilepticus: Secondary | ICD-10-CM | POA: Diagnosis not present

## 2024-07-21 DIAGNOSIS — Z1329 Encounter for screening for other suspected endocrine disorder: Secondary | ICD-10-CM | POA: Diagnosis not present

## 2024-07-21 DIAGNOSIS — G809 Cerebral palsy, unspecified: Secondary | ICD-10-CM | POA: Diagnosis not present

## 2024-07-21 DIAGNOSIS — Z23 Encounter for immunization: Secondary | ICD-10-CM | POA: Diagnosis not present

## 2024-07-21 DIAGNOSIS — F79 Unspecified intellectual disabilities: Secondary | ICD-10-CM

## 2024-07-21 DIAGNOSIS — Z13228 Encounter for screening for other metabolic disorders: Secondary | ICD-10-CM | POA: Diagnosis not present

## 2024-07-21 DIAGNOSIS — E782 Mixed hyperlipidemia: Secondary | ICD-10-CM | POA: Diagnosis not present

## 2024-07-21 DIAGNOSIS — Z0000 Encounter for general adult medical examination without abnormal findings: Secondary | ICD-10-CM | POA: Insufficient documentation

## 2024-07-21 DIAGNOSIS — Z130 Encounter for screening for diseases of the blood and blood-forming organs and certain disorders involving the immune mechanism: Secondary | ICD-10-CM

## 2024-07-21 DIAGNOSIS — Z0001 Encounter for general adult medical examination with abnormal findings: Secondary | ICD-10-CM | POA: Diagnosis not present

## 2024-07-21 DIAGNOSIS — Z780 Asymptomatic menopausal state: Secondary | ICD-10-CM

## 2024-07-21 NOTE — Assessment & Plan Note (Signed)
 Noted.  Patient lives with her mother, Ayah Cozzolino, who is her legal guardian.  No acute concerns.  Continue to monitor.

## 2024-07-21 NOTE — Assessment & Plan Note (Addendum)
 Managed with simvastatin  20 mg Will repeat lipids/cmp for stability. Patient is not fasting today. The 10-year ASCVD risk score (Arnett DK, et al., 2019) is: 0.9%

## 2024-07-21 NOTE — Progress Notes (Signed)
 Complete physical exam   Patient: Wendy Green   DOB: 21-Aug-1976   48 y.o. Female  MRN: [id number] Visit Date: 07/21/2024  Today's healthcare provider: LAURAINE LOISE BUOY, DO   Chief Complaint  Patient presents with   Annual Exam    Diet- General Exercise- not very much Overall feeling- good Sleep- Real good Concerns- None  Flu vaccine-yes   Subjective    Wendy Green is a 48 y.o. female who presents today for a complete physical exam.   HPI HPI     Annual Exam    Additional comments: Diet- General Exercise- not very much Overall feeling- good Sleep- Real good Concerns- None  Flu vaccine-yes      Last edited by Toombs, Heather  L, CMA on 07/21/2024 10:55 AM.       Wendy Green is a 48 year old female who presents for an annual physical exam.  She has a history of seizures, which were well-controlled until a lapse in her primidone prescription led to two seizures last winter. She is currently taking primidone, Lamictal, Diamox, oxcarbazepine , and folic acid .  She has not had a menstrual period for some time, with a previous episode in mid-October 2024 of spotting being the last occurrence.  She is not currently interested in receiving the COVID booster or tetanus vaccine.  She prefers staying at home rather than attending social events like Halloween parties. She enjoys listening to old country music and reminiscing about her father. She eats oatmeal for breakfast and is not engaging in much physical activity currently.    Past Medical History:  Diagnosis Date   Abnormal weight gain 02/24/2015   Cerebral seizure (HCC) 02/24/2015   dx at age 32.    Changeable mood 02/24/2015   Epilepsy (HCC) 05/16/2009   Episodic mood disorder 09/11/2015   Family history of adverse reaction to anesthesia    mother has nausea and vomiting.   Headache, migraine 09/11/2015   Hematuria 06/04/2016   High cholesterol    History of kidney stones    HLD  (hyperlipidemia) 05/16/2009   Intellectual disability 02/24/2015   Seborrheic dermatitis of scalp    Seizure (HCC) 09/11/2015   Past Surgical History:  Procedure Laterality Date   COLONOSCOPY WITH PROPOFOL  N/A 08/06/2022   Procedure: COLONOSCOPY WITH PROPOFOL ;  Surgeon: Therisa Bi, MD;  Location: [facility] ENDOSCOPY;  Service: Gastroenterology;  Laterality: N/A;   CYSTOSCOPY W/ RETROGRADES Bilateral 07/16/2016   Procedure: CYSTOSCOPY WITH RETROGRADE PYELOGRAM;  Surgeon: Rosina Riis, MD;  Location: [facility] ORS;  Service: Urology;  Laterality: Bilateral;   NO PAST SURGERIES     Social History   Socioeconomic History   Marital status: Single    Spouse name: Not on file   Number of children: 0   Years of education: Not on file   Highest education level: 12th grade  Occupational History   Occupation: none  Tobacco Use   Smoking status: Never   Smokeless tobacco: Never  Vaping Use   Vaping status: Never Used  Substance and Sexual Activity   Alcohol use: No    Alcohol/week: 0.0 standard drinks of alcohol   Drug use: No   Sexual activity: Not Currently    Birth control/protection: None, Injection  Other Topics Concern   Not on file  Social History Narrative   Not on file   Social Drivers of Health   Financial Resource Strain: Low Risk  (07/20/2024)   Overall Financial Resource Strain (CARDIA)  Difficulty of Paying Living Expenses: Not hard at all  Food Insecurity: No Food Insecurity (07/20/2024)   Hunger Vital Sign    Worried About Running Out of Food in the Last Year: Never true    Ran Out of Food in the Last Year: Never true  Transportation Needs: No Transportation Needs (07/20/2024)   PRAPARE - Administrator, Civil Service (Medical): No    Lack of Transportation (Non-Medical): No  Physical Activity: Inactive (07/20/2024)   Exercise Vital Sign    Days of Exercise per Week: 0 days    Minutes of Exercise per Session: 0 min  Stress: No Stress Concern Present  (07/20/2024)   [employer] of Occupational Health - Occupational Stress Questionnaire    Feeling of Stress: Not at all  Social Connections: Socially Isolated (07/20/2024)   Social Connection and Isolation Panel    Frequency of Communication with Friends and Family: Three times a week    Frequency of Social Gatherings with Friends and Family: Once a week    Attends Religious Services: Never    Database Administrator or Organizations: No    Attends Banker Meetings: Never    Marital Status: Never married  Intimate Partner Violence: Not At Risk (07/20/2024)   Humiliation, Afraid, Rape, and Kick questionnaire    Fear of Current or Ex-Partner: No    Emotionally Abused: No    Physically Abused: No    Sexually Abused: No   Family Status  Relation Name Status   Mother  Alive   Father  Deceased   Mat Aunt  (Not Specified)   MGM  Deceased   MGF  Deceased   PGM  Deceased   PGF  Deceased   Neg Hx  (Not Specified)  No partnership data on file   Family History  Problem Relation Age of Onset   Heart attack Father    Breast cancer Maternal Aunt    Stroke Maternal Grandmother    Pneumonia Maternal Grandfather    Cancer Paternal Grandfather    Prostate cancer Neg Hx    Kidney cancer Neg Hx    Ovarian cancer Neg Hx    Colon cancer Neg Hx    Diabetes Neg Hx    Allergies  Allergen Reactions   Aspirin     Patient Care Team: Samina Weekes, Lauraine SAILOR, DO as PCP - General (Family Medicine) Connell Davies, MD (Inactive) as Referring Physician (Obstetrics and Gynecology) Lane Arthea BRAVO, MD as Referring Physician (Neurology) Pa, [facility] as Consulting Physician (Dermatology) Pa, [facility] (Optometry)   Medications: Outpatient Medications Prior to Visit  Medication Sig   acetaminophen (TYLENOL) 500 MG tablet Take 500 mg by mouth every 6 (six) hours as needed.   acetaZOLAMIDE (DIAMOX) 250 MG tablet TAKE 1 TABLET BY MOUTH 3 TIMES A DAY   fluticasone  (FLONASE) 50 MCG/ACT nasal spray Place into both nostrils daily. (Patient taking differently: Place 1 spray into both nostrils as needed for allergies.)   folic acid  (FOLVITE ) 1 MG tablet TAKE 1 TABLET BY MOUTH ONCE DAILY   KRILL OIL PO Take 1 capsule by mouth 2 (two) times daily.    MULTIPLE VITAMIN PO 1 tablet daily.   OXcarbazepine  (TRILEPTAL ) 150 MG tablet Take 150 mg by mouth at bedtime.   primidone (MYSOLINE) 250 MG tablet Take by mouth. TAKE 1 TAB IN THE MORNING, 1 AND 1/2 TAB IN THE AFTERNOON   simvastatin  (ZOCOR ) 20 MG tablet Take 1 tablet (20 mg  total) by mouth at bedtime.   Wheat Dextrin (BENEFIBER PO) Take by mouth. As needed   No facility-administered medications prior to visit.    Review of Systems  Constitutional:  Negative for chills, fatigue and fever.  HENT:  Negative for congestion, ear pain, rhinorrhea, sneezing and sore throat.   Eyes: Negative.  Negative for pain and redness.  Respiratory:  Negative for cough, shortness of breath and wheezing.   Cardiovascular:  Negative for chest pain and leg swelling.  Gastrointestinal:  Negative for abdominal pain, blood in stool, constipation, diarrhea and nausea.  Endocrine: Negative for polydipsia and polyphagia.  Genitourinary: Negative.  Negative for dysuria, flank pain, hematuria, pelvic pain, vaginal bleeding and vaginal discharge.  Musculoskeletal:  Negative for arthralgias, back pain, gait problem and joint swelling.  Skin:  Negative for rash.  Neurological: Negative.  Negative for dizziness, tremors, seizures, weakness, light-headedness, numbness and headaches.  Hematological:  Negative for adenopathy.  Psychiatric/Behavioral: Negative.  Negative for behavioral problems, confusion and dysphoric mood. The patient is not nervous/anxious and is not hyperactive.        Objective    BP 122/67 (BP Location: Left Arm, Patient Position: Sitting, Cuff Size: Normal)   Pulse 90   Temp 97.6 F (36.4 C) (Oral)   Ht 5' 4  (1.626 m)   Wt 180 lb (81.6 kg)   SpO2 100%   BMI 30.90 kg/m    Physical Exam Vitals and nursing note reviewed.  Constitutional:      General: She is awake.     Appearance: Normal appearance.  HENT:     Head: Normocephalic and atraumatic.     Right Ear: Tympanic membrane, ear canal and external ear normal.     Left Ear: Tympanic membrane, ear canal and external ear normal.     Nose: Nose normal.     Mouth/Throat:     Mouth: Mucous membranes are moist.     Pharynx: Oropharynx is clear. No oropharyngeal exudate or posterior oropharyngeal erythema.  Eyes:     General: No scleral icterus.    Conjunctiva/sclera: Conjunctivae normal.     Pupils: Pupils are equal, round, and reactive to light.     Comments: Mild medial strabismus of right eye  Neck:     Thyroid : No thyromegaly or thyroid  tenderness.  Cardiovascular:     Rate and Rhythm: Normal rate and regular rhythm.     Pulses: Normal pulses.     Heart sounds: Normal heart sounds.  Pulmonary:     Effort: Pulmonary effort is normal. No tachypnea, bradypnea or respiratory distress.     Breath sounds: Normal breath sounds. No stridor. No wheezing, rhonchi or rales.  Abdominal:     General: Bowel sounds are normal. There is no distension.     Palpations: Abdomen is soft. There is no mass.     Tenderness: There is no abdominal tenderness. There is no guarding.     Hernia: No hernia is present.  Musculoskeletal:     Cervical back: Normal range of motion and neck supple.     Right lower leg: No edema.     Left lower leg: No edema.  Lymphadenopathy:     Cervical: No cervical adenopathy.  Skin:    General: Skin is warm and dry.  Neurological:     Mental Status: She is alert and oriented to person, place, and time. Mental status is at baseline.  Psychiatric:        Mood and Affect: Mood normal.  Behavior: Behavior normal.      Last depression screening scores    07/20/2024    9:00 AM 07/17/2023    9:06 AM 07/15/2022    10:39 AM  PHQ 2/9 Scores  PHQ - 2 Score 0 0 0  PHQ- 9 Score 0  0   Last fall risk screening    07/20/2024    9:03 AM  Fall Risk   Falls in the past year? 0  Number falls in past yr: 0  Injury with Fall? 0  Risk for fall due to : Impaired balance/gait  Follow up Falls evaluation completed;Falls prevention discussed   Last Audit-C alcohol use screening    07/20/2024    9:00 AM  Alcohol Use Disorder Test (AUDIT)  1. How often do you have a drink containing alcohol? 0  2. How many drinks containing alcohol do you have on a typical day when you are drinking? 0  3. How often do you have six or more drinks on one occasion? 0  AUDIT-C Score 0   A score of 3 or more in women, and 4 or more in men indicates increased risk for alcohol abuse, EXCEPT if all of the points are from question 1   No results found for any visits on 07/21/24.  Assessment & Plan    Routine Health Maintenance and Physical Exam  Exercise Activities and Dietary recommendations  Goals      DIET - EAT MORE FRUITS AND VEGETABLES     DIET - INCREASE WATER INTAKE     Recommend increasing water intake to 4-6 glasses a day.      LIFESTYLE - DECREASE FALLS RISK     Recommend to remove any items from the home that may cause slips or trips.        Immunization History  Administered Date(s) Administered   Influenza, Seasonal, Injecte, Preservative Fre 05/20/2023, 07/21/2024   Influenza,inj,Quad PF,6+ Mos 06/06/2016, 06/17/2017, 07/29/2018, 06/22/2019, 05/15/2020, 07/13/2021, 07/15/2022   PFIZER(Purple Top)SARS-COV-2 Vaccination 12/21/2019, 01/11/2020, 07/21/2020   Pfizer Covid-19 Vaccine Bivalent Booster 46yrs & up 09/30/2021   Tdap 07/22/2011    Health Maintenance  Topic Date Due   COVID-19 Vaccine (5 - 2025-26 season) 05/24/2025 (Originally 05/24/2024)   DTaP/Tdap/Td (2 - Td or Tdap) 07/21/2025 (Originally 07/21/2021)   Hepatitis B Vaccines 19-59 Average Risk (1 of 3 - 19+ 3-dose series) 07/21/2025  (Originally 05/19/1995)   Mammogram  09/30/2024   Medicare Annual Wellness (AWV)  07/20/2025   Colonoscopy  08/06/2025   Cervical Cancer Screening (HPV/Pap Cotest)  04/13/2026   Influenza Vaccine  Completed   HIV Screening  Completed   Pneumococcal Vaccine  Aged Out   HPV VACCINES  Aged Out   Meningococcal B Vaccine  Aged Out    Discussed health benefits of physical activity, and encouraged her to engage in regular exercise appropriate for her age and condition.   Annual physical exam Assessment & Plan: Physical exam overall unremarkable except as noted above. Routine lab work ordered as noted.  Discussed COVID booster, tetanus vaccine, and hepatitis B series. She declined all vaccines.    Cerebral palsy, unspecified type [facility]) Assessment & Plan: Noted.  No acute concerns.  Continue to monitor.     Generalized idiopathic epilepsy, not intractable, without status epilepticus (HCC) Assessment & Plan: Noted.  Seizure disorder well-managed on current medication regimen.  Two seizures over the winter due to lapse in prescription.  Medication plan per Dr. Clement note: - Continue taking Lamictal 150 mg  twice a day for seizure like activity. - Continue taking Primidone 250 mg in the morning and 375 mg in the afternoon. - Continue taking Diamox 250 mg three times a day. - Continue taking Oxcarbazepine  150 mg nightly. Refill.  - Continue taking Folic acid  1 mg daily. Refill.  - Could consider starting Keppra at future visit.  - Could consider starting Depakote at future visit.   - Defer to specialist management.   Need for influenza vaccination -     Flu vaccine trivalent PF, 6mos and older(Flulaval,Afluria,Fluarix,Fluzone) -     Lamotrigine level  Mixed hyperlipidemia Assessment & Plan: Managed with simvastatin  20 mg Will repeat lipids/cmp for stability. Patient is not fasting today. The 10-year ASCVD risk score (Arnett DK, et al., 2019) is: 0.9%  Orders: -     Lipid  panel  Intellectual disability Assessment & Plan: Noted.  Patient lives with her mother, Abagayle Klutts, who is her legal guardian.  No acute concerns.  Continue to monitor.     Postmenopause Assessment & Plan: Noted. LMP mid-October 2024 with no spotting or full cycles since. No acute concerns.  Continue to monitor.    High risk medication use -     Comprehensive metabolic panel with GFR -     Hemoglobin A1c -     TSH Rfx on Abnormal to Free T4 -     Lamotrigine level  Screening for endocrine, metabolic and immunity disorder -     Comprehensive metabolic panel with GFR -     Hemoglobin A1c -     TSH Rfx on Abnormal to Free T4     Return in about 1 year (around 07/21/2025) for CPE, Chronic f/u.     I discussed the assessment and treatment plan with the patient  The patient was provided an opportunity to ask questions and all were answered. The patient agreed with the plan and demonstrated an understanding of the instructions.   The patient was advised to call back or seek an in-person evaluation if the symptoms worsen or if the condition fails to improve as anticipated.    LAURAINE LOISE BUOY, DO  [facility] [place] [phone] (phone) [phone] (fax)  [facility]

## 2024-07-21 NOTE — Assessment & Plan Note (Signed)
 Noted.  No acute concerns.  Continue to monitor.

## 2024-07-21 NOTE — Assessment & Plan Note (Addendum)
 Noted.  Seizure disorder well-managed on current medication regimen.  Two seizures over the winter due to lapse in prescription.  Medication plan per Dr. Clement note: - Continue taking Lamictal 150 mg twice a day for seizure like activity. - Continue taking Primidone 250 mg in the morning and 375 mg in the afternoon. - Continue taking Diamox 250 mg three times a day. - Continue taking Oxcarbazepine  150 mg nightly. Refill.  - Continue taking Folic acid  1 mg daily. Refill.  - Could consider starting Keppra at future visit.  - Could consider starting Depakote at future visit.   - Defer to specialist management.

## 2024-07-21 NOTE — Assessment & Plan Note (Signed)
 Physical exam overall unremarkable except as noted above. Routine lab work ordered as noted.  Discussed COVID booster, tetanus vaccine, and hepatitis B series. She declined all vaccines.

## 2024-07-21 NOTE — Assessment & Plan Note (Signed)
 Noted. LMP mid-October 2024 with no spotting or full cycles since. No acute concerns.  Continue to monitor.

## 2024-07-21 NOTE — Patient Instructions (Signed)
 Recommended vaccines to get at pharmacy: Tdap (tetanus, diphtheria and pertussis).

## 2024-07-22 LAB — TSH RFX ON ABNORMAL TO FREE T4: TSH: 1.72 u[IU]/mL (ref 0.450–4.500)

## 2024-07-22 LAB — COMPREHENSIVE METABOLIC PANEL WITH GFR
ALT: 21 IU/L (ref 0–32)
AST: 15 IU/L (ref 0–40)
Albumin: 4.5 g/dL (ref 3.9–4.9)
Alkaline Phosphatase: 94 IU/L (ref 41–116)
BUN/Creatinine Ratio: 22 (ref 9–23)
BUN: 13 mg/dL (ref 6–24)
Bilirubin Total: <0.2 mg/dL (ref 0.0–1.2)
CO2: 18 mmol/L — ABNORMAL LOW (ref 20–29)
Calcium: 9.6 mg/dL (ref 8.7–10.2)
Chloride: 106 mmol/L (ref 96–106)
Creatinine, Ser: 0.58 mg/dL (ref 0.57–1.00)
Globulin, Total: 2.3 g/dL (ref 1.5–4.5)
Glucose: 105 mg/dL — ABNORMAL HIGH (ref 70–99)
Potassium: 3.7 mmol/L (ref 3.5–5.2)
Sodium: 139 mmol/L (ref 134–144)
Total Protein: 6.8 g/dL (ref 6.0–8.5)
eGFR: 112 mL/min/1.73 (ref >59)

## 2024-07-22 LAB — LIPID PANEL
Chol/HDL Ratio: 3 ratio (ref 0.0–4.4)
Cholesterol, Total: 198 mg/dL (ref 100–199)
HDL: 65 mg/dL (ref >39)
LDL Chol Calc (NIH): 100 mg/dL — ABNORMAL HIGH (ref 0–99)
Triglycerides: 192 mg/dL — ABNORMAL HIGH (ref 0–149)
VLDL Cholesterol Cal: 33 mg/dL (ref 5–40)

## 2024-07-22 LAB — HEMOGLOBIN A1C
Est. average glucose Bld gHb Est-mCnc: 103 mg/dL
Hgb A1c MFr Bld: 5.2 % (ref 4.8–5.6)

## 2024-07-22 LAB — LAMOTRIGINE LEVEL: Lamotrigine Lvl: 3.4 ug/mL (ref 2.0–20.0)

## 2024-08-04 ENCOUNTER — Ambulatory Visit: Payer: Self-pay | Admitting: Family Medicine

## 2024-10-14 ENCOUNTER — Other Ambulatory Visit: Payer: Self-pay | Admitting: Family Medicine

## 2024-10-14 DIAGNOSIS — E782 Mixed hyperlipidemia: Secondary | ICD-10-CM

## 2025-07-26 ENCOUNTER — Ambulatory Visit
